# Patient Record
Sex: Female | Born: 1965
Health system: Southern US, Community
[De-identification: ages and names within clinical notes are randomized; demographics above are authoritative.]

## PROBLEM LIST (undated history)

## (undated) DIAGNOSIS — F329 Major depressive disorder, single episode, unspecified: Secondary | ICD-10-CM

## (undated) DIAGNOSIS — R519 Headache, unspecified: Secondary | ICD-10-CM

## (undated) DIAGNOSIS — Z5189 Encounter for other specified aftercare: Secondary | ICD-10-CM

## (undated) DIAGNOSIS — I499 Cardiac arrhythmia, unspecified: Secondary | ICD-10-CM

## (undated) DIAGNOSIS — F32A Depression, unspecified: Secondary | ICD-10-CM

## (undated) DIAGNOSIS — D649 Anemia, unspecified: Secondary | ICD-10-CM

## (undated) DIAGNOSIS — M1990 Unspecified osteoarthritis, unspecified site: Secondary | ICD-10-CM

## (undated) DIAGNOSIS — T7840XA Allergy, unspecified, initial encounter: Secondary | ICD-10-CM

## (undated) DIAGNOSIS — I2699 Other pulmonary embolism without acute cor pulmonale: Secondary | ICD-10-CM

## (undated) DIAGNOSIS — I10 Essential (primary) hypertension: Secondary | ICD-10-CM

## (undated) DIAGNOSIS — R002 Palpitations: Secondary | ICD-10-CM

## (undated) DIAGNOSIS — E039 Hypothyroidism, unspecified: Secondary | ICD-10-CM

## (undated) DIAGNOSIS — Z86010 Personal history of colonic polyps: Secondary | ICD-10-CM

## (undated) DIAGNOSIS — Z8489 Family history of other specified conditions: Secondary | ICD-10-CM

## (undated) DIAGNOSIS — J45909 Unspecified asthma, uncomplicated: Secondary | ICD-10-CM

## (undated) DIAGNOSIS — Z8672 Personal history of thrombophlebitis: Secondary | ICD-10-CM

## (undated) DIAGNOSIS — R51 Headache: Secondary | ICD-10-CM

## (undated) HISTORY — DX: Headache, unspecified: R51.9

## (undated) HISTORY — PX: CHOLECYSTECTOMY: SHX55

## (undated) HISTORY — DX: Encounter for other specified aftercare: Z51.89

## (undated) HISTORY — DX: Palpitations: R00.2

## (undated) HISTORY — DX: Unspecified asthma, uncomplicated: J45.909

## (undated) HISTORY — DX: Other pulmonary embolism without acute cor pulmonale: I26.99

## (undated) HISTORY — DX: Anemia, unspecified: D64.9

## (undated) HISTORY — DX: Headache: R51

## (undated) HISTORY — DX: Depression, unspecified: F32.A

## (undated) HISTORY — DX: Unspecified osteoarthritis, unspecified site: M19.90

## (undated) HISTORY — DX: Allergy, unspecified, initial encounter: T78.40XA

## (undated) HISTORY — DX: Major depressive disorder, single episode, unspecified: F32.9

## (undated) HISTORY — DX: Personal history of thrombophlebitis: Z86.72

## (undated) HISTORY — DX: Personal history of colonic polyps: Z86.010

---

## 2004-03-01 HISTORY — PX: ABDOMINAL HYSTERECTOMY: SHX81

## 2009-03-01 LAB — HM MAMMOGRAPHY: HM Mammogram: NORMAL

## 2010-03-01 LAB — HM PAP SMEAR: HM Pap smear: NORMAL

## 2011-05-03 ENCOUNTER — Ambulatory Visit
Admission: RE | Admit: 2011-05-03 | Discharge: 2011-05-03 | Disposition: A | Discharge status: Home / Self Care | Payer: No Typology Code available for payment source | Source: Ambulatory Visit | Attending: Infectious Diseases | Admitting: Infectious Diseases

## 2011-05-03 ENCOUNTER — Other Ambulatory Visit: Payer: Self-pay | Admitting: Infectious Diseases

## 2011-05-03 DIAGNOSIS — R7611 Nonspecific reaction to tuberculin skin test without active tuberculosis: Secondary | ICD-10-CM

## 2012-11-17 ENCOUNTER — Encounter: Payer: Self-pay | Admitting: Internal Medicine

## 2012-11-17 ENCOUNTER — Ambulatory Visit (INDEPENDENT_AMBULATORY_CARE_PROVIDER_SITE_OTHER): Payer: BC Managed Care – PPO | Admitting: Internal Medicine

## 2012-11-17 ENCOUNTER — Other Ambulatory Visit (INDEPENDENT_AMBULATORY_CARE_PROVIDER_SITE_OTHER): Payer: BC Managed Care – PPO

## 2012-11-17 ENCOUNTER — Ambulatory Visit (INDEPENDENT_AMBULATORY_CARE_PROVIDER_SITE_OTHER): Payer: BC Managed Care – PPO | Admitting: General Practice

## 2012-11-17 VITALS — BP 126/80 | HR 84 | Temp 98.7°F | Resp 16 | Ht 66.0 in | Wt 264.0 lb

## 2012-11-17 DIAGNOSIS — R760 Raised antibody titer: Secondary | ICD-10-CM

## 2012-11-17 DIAGNOSIS — M179 Osteoarthritis of knee, unspecified: Secondary | ICD-10-CM

## 2012-11-17 DIAGNOSIS — R894 Abnormal immunological findings in specimens from other organs, systems and tissues: Secondary | ICD-10-CM

## 2012-11-17 DIAGNOSIS — F17200 Nicotine dependence, unspecified, uncomplicated: Secondary | ICD-10-CM

## 2012-11-17 DIAGNOSIS — Z1231 Encounter for screening mammogram for malignant neoplasm of breast: Secondary | ICD-10-CM

## 2012-11-17 DIAGNOSIS — G894 Chronic pain syndrome: Secondary | ICD-10-CM

## 2012-11-17 DIAGNOSIS — Z86711 Personal history of pulmonary embolism: Secondary | ICD-10-CM

## 2012-11-17 DIAGNOSIS — Z720 Tobacco use: Secondary | ICD-10-CM

## 2012-11-17 DIAGNOSIS — IMO0002 Reserved for concepts with insufficient information to code with codable children: Secondary | ICD-10-CM

## 2012-11-17 DIAGNOSIS — M1710 Unilateral primary osteoarthritis, unspecified knee: Secondary | ICD-10-CM

## 2012-11-17 LAB — CBC WITH DIFFERENTIAL/PLATELET
Basophils Absolute: 0 10*3/uL (ref 0.0–0.1)
Eosinophils Relative: 3.1 % (ref 0.0–5.0)
MCV: 97.3 fl (ref 78.0–100.0)
Monocytes Absolute: 0.5 10*3/uL (ref 0.1–1.0)
Neutrophils Relative %: 52.6 % (ref 43.0–77.0)
Platelets: 284 10*3/uL (ref 150.0–400.0)
RDW: 14.8 % — ABNORMAL HIGH (ref 11.5–14.6)
WBC: 8.9 10*3/uL (ref 4.5–10.5)

## 2012-11-17 LAB — PROTIME-INR
INR: 1 ratio (ref 0.8–1.0)
Prothrombin Time: 10.8 s (ref 10.2–12.4)

## 2012-11-17 LAB — APTT: aPTT: 29.1 s — ABNORMAL HIGH (ref 21.7–28.8)

## 2012-11-17 MED ORDER — WARFARIN SODIUM 5 MG PO TABS: 15.0000 mg | ORAL_TABLET | Freq: Every day | ORAL | Status: DC

## 2012-11-17 MED ORDER — HYDROCODONE-ACETAMINOPHEN 5-325 MG PO TABS: 1.0000 | ORAL_TABLET | Freq: Four times a day (QID) | ORAL | Status: DC | PRN

## 2012-11-17 NOTE — Assessment & Plan Note (Signed)
Restart coumadin Check her d-dimer level

## 2012-11-17 NOTE — Assessment & Plan Note (Signed)
She has recently been taking tramadol without much relief from the pain I will her UDS to see if there is substance abuse

## 2012-11-17 NOTE — Assessment & Plan Note (Signed)
I will recheck her coag profile and anticardiolipin ab level Will also check her d-dimer to see if she has any active clotting as she has not taken coumadin in a while She was referred to the coag clinic as well

## 2012-11-17 NOTE — Patient Instructions (Signed)

## 2012-11-17 NOTE — Progress Notes (Signed)
  Subjective:    Patient ID: Denise Morgan, female    DOB: 11-Feb-1966, 47 y.o.   MRN: [id number]  Arthritis Presents for follow-up visit. The disease course has been fluctuating. The condition has lasted for 3 years. She complains of pain. She reports no stiffness, joint swelling or joint warmth. Affected locations include the left hip, right knee, right hip and left knee. Her pain is at a severity of 4/10. Associated symptoms include pain at night and pain while resting. Pertinent negatives include no diarrhea, dry eyes, dry mouth, dysuria, fatigue, fever, rash, Raynaud's syndrome, uveitis or weight loss. Her past medical history is significant for osteoarthritis. Past treatments include an opioid, acetaminophen and NSAIDs. The treatment provided moderate relief. Factors aggravating her arthritis include activity. Compliance with prior treatments has been variable.      Review of Systems  Constitutional: Negative.  Negative for fever, chills, weight loss, diaphoresis, activity change, appetite change, fatigue and unexpected weight change.  HENT: Negative.   Eyes: Negative.   Respiratory: Negative.  Negative for cough, choking, chest tightness, shortness of breath, wheezing and stridor.   Cardiovascular: Negative.  Negative for chest pain, palpitations and leg swelling.  Gastrointestinal: Negative.  Negative for nausea, vomiting, abdominal pain, diarrhea, constipation, blood in stool and anal bleeding.  Endocrine: Negative.   Genitourinary: Negative.  Negative for dysuria, frequency, hematuria, flank pain, enuresis, difficulty urinating and dyspareunia.  Musculoskeletal: Positive for arthralgias and arthritis. Negative for myalgias, back pain, joint swelling, gait problem and stiffness.  Skin: Negative.  Negative for color change, pallor, rash and wound.  Allergic/Immunologic: Negative for environmental allergies, food allergies and immunocompromised state.  Neurological: Negative.    Hematological: Negative.  Negative for adenopathy. Does not bruise/bleed easily.  Psychiatric/Behavioral: Negative.        Objective:   Physical Exam  Vitals reviewed. Constitutional: She is oriented to person, place, and time. She appears well-developed and well-nourished. No distress.  HENT:  Head: Normocephalic and atraumatic.  Mouth/Throat: Oropharynx is clear and moist. No oropharyngeal exudate.  Eyes: Conjunctivae are normal. Right eye exhibits no discharge. Left eye exhibits no discharge. No scleral icterus.  Neck: Normal range of motion. Neck supple. No JVD present. No tracheal deviation present. No thyromegaly present.  Cardiovascular: Normal rate, regular rhythm, normal heart sounds and intact distal pulses.  Exam reveals no gallop and no friction rub.   No murmur heard. Pulmonary/Chest: Effort normal and breath sounds normal. No stridor. No respiratory distress. She has no wheezes. She has no rales. She exhibits no tenderness.  Abdominal: Bowel sounds are normal. She exhibits no distension and no mass. There is no tenderness. There is no rebound and no guarding.  Musculoskeletal: Normal range of motion. She exhibits no edema and no tenderness.  Lymphadenopathy:    She has no cervical adenopathy.  Neurological: She is oriented to person, place, and time.  Skin: Skin is warm and dry. No rash noted. She is not diaphoretic. No erythema. No pallor.  Psychiatric: She has a normal mood and affect. Her behavior is normal. Judgment and thought content normal.     No results found for this basename: WBC, HGB, HCT, PLT, GLUCOSE, CHOL, TRIG, HDL, LDLDIRECT, LDLCALC, ALT, AST, NA, K, CL, CREATININE, BUN, CO2, TSH, PSA, INR, GLUF, HGBA1C, MICROALBUR       Assessment & Plan:

## 2012-11-18 LAB — DRUGS OF ABUSE SCREEN W/O ALC, ROUTINE URINE
Barbiturate Quant, Ur: NEGATIVE
Creatinine,U: 173.9 mg/dL
Methadone: NEGATIVE
Opiate Screen, Urine: NEGATIVE

## 2012-11-18 LAB — D-DIMER, QUANTITATIVE: D-Dimer, Quant: 0.33 ug/mL-FEU (ref 0.00–0.48)

## 2012-11-19 ENCOUNTER — Encounter: Payer: Self-pay | Admitting: Internal Medicine

## 2012-11-19 DIAGNOSIS — Z720 Tobacco use: Secondary | ICD-10-CM | POA: Insufficient documentation

## 2012-11-19 NOTE — Assessment & Plan Note (Signed)
I will check her UDS to see if there is any concern for substance abuse

## 2012-11-19 NOTE — Assessment & Plan Note (Signed)
Though she is not interested I have asked her to quit smoking

## 2012-11-20 ENCOUNTER — Other Ambulatory Visit: Payer: Self-pay | Admitting: Internal Medicine

## 2012-11-20 ENCOUNTER — Other Ambulatory Visit: Payer: Self-pay

## 2012-11-20 LAB — HYPERCOAGULABLE PANEL, COMPREHENSIVE
AntiThromb III Func: 109 % (ref 76–126)
Beta-2 Glyco I IgG: 0 G Units (ref <20)
Beta-2-Glycoprotein I IgA: 0 A Units (ref <20)
Beta-2-Glycoprotein I IgM: 10 M Units (ref <20)
Lupus Anticoagulant: NOT DETECTED
Protein C, Total: 87 % (ref 72–160)
Protein S Activity: 143 % — ABNORMAL HIGH (ref 69–129)

## 2012-11-20 MED ORDER — TRAZODONE HCL 100 MG PO TABS: 100.0000 mg | ORAL_TABLET | Freq: Every day | ORAL | Status: DC

## 2012-11-20 MED ORDER — ALBUTEROL SULFATE HFA 108 (90 BASE) MCG/ACT IN AERS: 2.0000 | INHALATION_SPRAY | RESPIRATORY_TRACT | Status: DC | PRN

## 2012-11-20 NOTE — Telephone Encounter (Signed)
Received faxed refill request for trazodone 100 mg, please advise if ok to refill. Thanks

## 2012-11-20 NOTE — Telephone Encounter (Signed)
Please advise if ok to refill the trazodone?

## 2012-11-21 ENCOUNTER — Encounter: Payer: Self-pay | Admitting: Internal Medicine

## 2012-11-24 ENCOUNTER — Ambulatory Visit (INDEPENDENT_AMBULATORY_CARE_PROVIDER_SITE_OTHER): Payer: BC Managed Care – PPO | Admitting: General Practice

## 2012-11-24 DIAGNOSIS — Z86711 Personal history of pulmonary embolism: Secondary | ICD-10-CM

## 2012-11-24 DIAGNOSIS — R894 Abnormal immunological findings in specimens from other organs, systems and tissues: Secondary | ICD-10-CM

## 2012-11-24 DIAGNOSIS — R760 Raised antibody titer: Secondary | ICD-10-CM

## 2012-11-24 LAB — POCT INR: INR: 3.2

## 2012-11-28 ENCOUNTER — Ambulatory Visit (INDEPENDENT_AMBULATORY_CARE_PROVIDER_SITE_OTHER): Payer: BC Managed Care – PPO | Admitting: General Practice

## 2012-11-28 ENCOUNTER — Telehealth: Payer: Self-pay | Admitting: General Practice

## 2012-11-28 DIAGNOSIS — Z86711 Personal history of pulmonary embolism: Secondary | ICD-10-CM

## 2012-11-28 DIAGNOSIS — R760 Raised antibody titer: Secondary | ICD-10-CM

## 2012-11-28 DIAGNOSIS — R894 Abnormal immunological findings in specimens from other organs, systems and tissues: Secondary | ICD-10-CM

## 2012-11-28 NOTE — Telephone Encounter (Signed)
Message copied by Garrison Columbus on Tue Nov 28, 2012  1:34 PM ------      Message from: Etta Grandchild      Created: Mon Nov 27, 2012  7:20 PM       Yes, it appears that her anticardiolipin ab has resolved.            Leitha Schuller      ----- Message -----         From: Garrison Columbus, RN         Sent: 11/24/2012  11:37 AM           To: Etta Grandchild, MD            Is patient supposed to stop coumadin?  Patient is under the impression that it is OK.            Cindy       ------

## 2012-12-08 ENCOUNTER — Ambulatory Visit
Admission: RE | Admit: 2012-12-08 | Discharge: 2012-12-08 | Disposition: A | Discharge status: Home / Self Care | Payer: BC Managed Care – PPO | Source: Ambulatory Visit | Attending: Internal Medicine | Admitting: Internal Medicine

## 2012-12-08 DIAGNOSIS — Z1231 Encounter for screening mammogram for malignant neoplasm of breast: Secondary | ICD-10-CM

## 2012-12-08 LAB — HM MAMMOGRAPHY: HM Mammogram: NORMAL

## 2013-01-11 ENCOUNTER — Telehealth: Payer: Self-pay | Admitting: *Deleted

## 2013-01-11 DIAGNOSIS — M1710 Unilateral primary osteoarthritis, unspecified knee: Secondary | ICD-10-CM

## 2013-01-11 DIAGNOSIS — M179 Osteoarthritis of knee, unspecified: Secondary | ICD-10-CM

## 2013-01-11 DIAGNOSIS — G894 Chronic pain syndrome: Secondary | ICD-10-CM

## 2013-01-11 MED ORDER — HYDROCODONE-ACETAMINOPHEN 5-325 MG PO TABS: 1.0000 | ORAL_TABLET | Freq: Four times a day (QID) | ORAL | Status: DC | PRN

## 2013-01-11 NOTE — Telephone Encounter (Signed)
Pt called requesting Hydrocodone refill.  Please advise 

## 2013-02-16 ENCOUNTER — Ambulatory Visit (INDEPENDENT_AMBULATORY_CARE_PROVIDER_SITE_OTHER): Payer: BC Managed Care – PPO

## 2013-02-16 ENCOUNTER — Ambulatory Visit (INDEPENDENT_AMBULATORY_CARE_PROVIDER_SITE_OTHER)
Admission: RE | Admit: 2013-02-16 | Discharge: 2013-02-16 | Disposition: A | Discharge status: Home / Self Care | Payer: BC Managed Care – PPO | Source: Ambulatory Visit | Attending: Internal Medicine | Admitting: Internal Medicine

## 2013-02-16 ENCOUNTER — Ambulatory Visit (INDEPENDENT_AMBULATORY_CARE_PROVIDER_SITE_OTHER): Payer: BC Managed Care – PPO | Admitting: Internal Medicine

## 2013-02-16 ENCOUNTER — Encounter: Payer: Self-pay | Admitting: Internal Medicine

## 2013-02-16 DIAGNOSIS — M25559 Pain in unspecified hip: Secondary | ICD-10-CM

## 2013-02-16 DIAGNOSIS — M169 Osteoarthritis of hip, unspecified: Secondary | ICD-10-CM | POA: Insufficient documentation

## 2013-02-16 DIAGNOSIS — M179 Osteoarthritis of knee, unspecified: Secondary | ICD-10-CM

## 2013-02-16 DIAGNOSIS — M549 Dorsalgia, unspecified: Secondary | ICD-10-CM

## 2013-02-16 DIAGNOSIS — M1710 Unilateral primary osteoarthritis, unspecified knee: Secondary | ICD-10-CM

## 2013-02-16 DIAGNOSIS — G894 Chronic pain syndrome: Secondary | ICD-10-CM

## 2013-02-16 DIAGNOSIS — Z0000 Encounter for general adult medical examination without abnormal findings: Secondary | ICD-10-CM | POA: Insufficient documentation

## 2013-02-16 DIAGNOSIS — G8929 Other chronic pain: Secondary | ICD-10-CM

## 2013-02-16 DIAGNOSIS — IMO0002 Reserved for concepts with insufficient information to code with codable children: Secondary | ICD-10-CM

## 2013-02-16 DIAGNOSIS — Z23 Encounter for immunization: Secondary | ICD-10-CM

## 2013-02-16 DIAGNOSIS — M1611 Unilateral primary osteoarthritis, right hip: Secondary | ICD-10-CM | POA: Insufficient documentation

## 2013-02-16 LAB — COMPREHENSIVE METABOLIC PANEL
AST: 21 U/L (ref 0–37)
Alkaline Phosphatase: 80 U/L (ref 39–117)
BUN: 10 mg/dL (ref 6–23)
Calcium: 8.9 mg/dL (ref 8.4–10.5)
Creatinine, Ser: 0.7 mg/dL (ref 0.4–1.2)
Glucose, Bld: 90 mg/dL (ref 70–99)
Total Protein: 7.2 g/dL (ref 6.0–8.3)

## 2013-02-16 LAB — LIPID PANEL
Cholesterol: 211 mg/dL — ABNORMAL HIGH (ref 0–200)
Total CHOL/HDL Ratio: 5
Triglycerides: 117 mg/dL (ref 0.0–149.0)
VLDL: 23.4 mg/dL (ref 0.0–40.0)

## 2013-02-16 LAB — LDL CHOLESTEROL, DIRECT: Direct LDL: 155.6 mg/dL

## 2013-02-16 LAB — TSH: TSH: 1.21 u[IU]/mL (ref 0.35–5.50)

## 2013-02-16 MED ORDER — HYDROCODONE-ACETAMINOPHEN 5-325 MG PO TABS: 1.0000 | ORAL_TABLET | Freq: Four times a day (QID) | ORAL | Status: DC | PRN

## 2013-02-16 NOTE — Patient Instructions (Signed)
Health Maintenance, Female A healthy lifestyle and preventative care can promote health and wellness.  Maintain regular health, dental, and eye exams.  Eat a healthy diet. Foods like vegetables, fruits, whole grains, low-fat dairy products, and lean protein foods contain the nutrients you need without too many calories. Decrease your intake of foods high in solid fats, added sugars, and salt. Get information about a proper diet from your caregiver, if necessary.  Regular physical exercise is one of the most important things you can do for your health. Most adults should get at least 150 minutes of moderate-intensity exercise (any activity that increases your heart rate and causes you to sweat) each week. In addition, most adults need muscle-strengthening exercises on 2 or more days a week.   Maintain a healthy weight. The body mass index (BMI) is a screening tool to identify possible weight problems. It provides an estimate of body fat based on height and weight. Your caregiver can help determine your BMI, and can help you achieve or maintain a healthy weight. For adults 20 years and older:  A BMI below 18.5 is considered underweight.  A BMI of 18.5 to 24.9 is normal.  A BMI of 25 to 29.9 is considered overweight.  A BMI of 30 and above is considered obese.  Maintain normal blood lipids and cholesterol by exercising and minimizing your intake of saturated fat. Eat a balanced diet with plenty of fruits and vegetables. Blood tests for lipids and cholesterol should begin at age 20 and be repeated every 5 years. If your lipid or cholesterol levels are high, you are over 50, or you are a high risk for heart disease, you may need your cholesterol levels checked more frequently.Ongoing high lipid and cholesterol levels should be treated with medicines if diet and exercise are not effective.  If you smoke, find out from your caregiver how to quit. If you do not use tobacco, do not start.  Lung  cancer screening is recommended for adults aged 55 80 years who are at high risk for developing lung cancer because of a history of smoking. Yearly low-dose computed tomography (CT) is recommended for people who have at least a 30-pack-year history of smoking and are a current smoker or have quit within the past 15 years. A pack year of smoking is smoking an average of 1 pack of cigarettes a day for 1 year (for example: 1 pack a day for 30 years or 2 packs a day for 15 years). Yearly screening should continue until the smoker has stopped smoking for at least 15 years. Yearly screening should also be stopped for people who develop a health problem that would prevent them from having lung cancer treatment.  If you are pregnant, do not drink alcohol. If you are breastfeeding, be very cautious about drinking alcohol. If you are not pregnant and choose to drink alcohol, do not exceed 1 drink per day. One drink is considered to be 12 ounces (355 mL) of beer, 5 ounces (148 mL) of wine, or 1.5 ounces (44 mL) of liquor.  Avoid use of street drugs. Do not share needles with anyone. Ask for help if you need support or instructions about stopping the use of drugs.  High blood pressure causes heart disease and increases the risk of stroke. Blood pressure should be checked at least every 1 to 2 years. Ongoing high blood pressure should be treated with medicines, if weight loss and exercise are not effective.  If you are 55 to   47 years old, ask your caregiver if you should take aspirin to prevent strokes.  Diabetes screening involves taking a blood sample to check your fasting blood sugar level. This should be done once every 3 years, after age 45, if you are within normal weight and without risk factors for diabetes. Testing should be considered at a younger age or be carried out more frequently if you are overweight and have at least 1 risk factor for diabetes.  Breast cancer screening is essential preventative care  for women. You should practice "breast self-awareness." This means understanding the normal appearance and feel of your breasts and may include breast self-examination. Any changes detected, no matter how small, should be reported to a caregiver. Women in their 20s and 30s should have a clinical breast exam (CBE) by a caregiver as part of a regular health exam every 1 to 3 years. After age 40, women should have a CBE every year. Starting at age 40, women should consider having a mammogram (breast X-ray) every year. Women who have a family history of breast cancer should talk to their caregiver about genetic screening. Women at a high risk of breast cancer should talk to their caregiver about having an MRI and a mammogram every year.  Breast cancer gene (BRCA)-related cancer risk assessment is recommended for women who have family members with BRCA-related cancers. BRCA-related cancers include breast, ovarian, tubal, and peritoneal cancers. Having family members with these cancers may be associated with an increased risk for harmful changes (mutations) in the breast cancer genes BRCA1 and BRCA2. Results of the assessment will determine the need for genetic counseling and BRCA1 and BRCA2 testing.  The Pap test is a screening test for cervical cancer. Women should have a Pap test starting at age 21. Between ages 21 and 29, Pap tests should be repeated every 2 years. Beginning at age 30, you should have a Pap test every 3 years as long as the past 3 Pap tests have been normal. If you had a hysterectomy for a problem that was not cancer or a condition that could lead to cancer, then you no longer need Pap tests. If you are between ages 65 and 70, and you have had normal Pap tests going back 10 years, you no longer need Pap tests. If you have had past treatment for cervical cancer or a condition that could lead to cancer, you need Pap tests and screening for cancer for at least 20 years after your treatment. If Pap  tests have been discontinued, risk factors (such as a new sexual partner) need to be reassessed to determine if screening should be resumed. Some women have medical problems that increase the chance of getting cervical cancer. In these cases, your caregiver may recommend more frequent screening and Pap tests.  The human papillomavirus (HPV) test is an additional test that may be used for cervical cancer screening. The HPV test looks for the virus that can cause the cell changes on the cervix. The cells collected during the Pap test can be tested for HPV. The HPV test could be used to screen women aged 30 years and older, and should be used in women of any age who have unclear Pap test results. After the age of 30, women should have HPV testing at the same frequency as a Pap test.  Colorectal cancer can be detected and often prevented. Most routine colorectal cancer screening begins at the age of 50 and continues through age 75. However, your caregiver   may recommend screening at an earlier age if you have risk factors for colon cancer. On a yearly basis, your caregiver may provide home test kits to check for hidden blood in the stool. Use of a small camera at the end of a tube, to directly examine the colon (sigmoidoscopy or colonoscopy), can detect the earliest forms of colorectal cancer. Talk to your caregiver about this at age 93, when routine screening begins. Direct examination of the colon should be repeated every 5 to 10 years through age 86, unless early forms of pre-cancerous polyps or small growths are found.  Hepatitis C blood testing is recommended for all people born from [birth year] through 1965 and any individual with known risks for hepatitis C.  Practice safe sex. Use condoms and avoid high-risk sexual practices to reduce the spread of sexually transmitted infections (STIs). Sexually active women aged 45 and younger should be checked for Chlamydia, which is a common sexually transmitted infection.  Older women with new or multiple partners should also be tested for Chlamydia. Testing for other STIs is recommended if you are sexually active and at increased risk.  Osteoporosis is a disease in which the bones lose minerals and strength with aging. This can result in serious bone fractures. The risk of osteoporosis can be identified using a bone density scan. Women ages 58 and over and women at risk for fractures or osteoporosis should discuss screening with their caregivers. Ask your caregiver whether you should be taking a calcium supplement or vitamin D to reduce the rate of osteoporosis.  Menopause can be associated with physical symptoms and risks. Hormone replacement therapy is available to decrease symptoms and risks. You should talk to your caregiver about whether hormone replacement therapy is right for you.  Use sunscreen. Apply sunscreen liberally and repeatedly throughout the day. You should seek shade when your shadow is shorter than you. Protect yourself by wearing long sleeves, pants, a wide-brimmed hat, and sunglasses year round, whenever you are outdoors.  Notify your caregiver of new moles or changes in moles, especially if there is a change in shape or color. Also notify your caregiver if a mole is larger than the size of a pencil eraser.  Stay current with your immunizations. Document Released: 08/31/2010 Document Revised: 06/12/2012 Document Reviewed: 08/31/2010 [facility] Patient Information 2014 [place], [place]. Osteoarthritis Osteoarthritis is the most common form of arthritis. It is redness, soreness, and swelling (inflammation) affecting the cartilage. Cartilage acts as a cushion, covering the ends of bones where they meet to form a joint. CAUSES  Over time, the cartilage begins to wear away. This causes bone to rub on bone. This produces pain and stiffness in the affected joints. Factors that contribute to this problem are:  Excessive body weight.  Age.  Overuse of  joints. SYMPTOMS   People with osteoarthritis usually experience joint pain, swelling, or stiffness.  Over time, the joint may lose its normal shape.  Small deposits of bone (osteophytes) may grow on the edges of the joint.  Bits of bone or cartilage can break off and float inside the joint space. This may cause more pain and damage.  Osteoarthritis can lead to depression, anxiety, feelings of helplessness, and limitations on daily activities. The most commonly affected joints are in the:  Ends of the fingers.  Thumbs.  Neck.  Lower back.  Knees.  Hips. DIAGNOSIS  Diagnosis is mostly based on your symptoms and exam. Tests may be helpful, including:  X-rays of the affected joint.  A computerized magnetic scan (MRI).  Blood tests to rule out other types of arthritis.  Joint fluid tests. This involves using a needle to draw fluid from the joint and examining the fluid under a microscope. TREATMENT  Goals of treatment are to control pain, improve joint function, maintain a normal body weight, and maintain a healthy lifestyle. Treatment approaches may include:  A prescribed exercise program with rest and joint relief.  Weight control with nutritional education.  Pain relief techniques such as:  Properly applied heat and cold.  Electric pulses delivered to nerve endings under the skin (transcutaneous electrical nerve stimulation, TENS).  Massage.  Certain supplements. Ask your caregiver before using any supplements, especially in combination with prescribed drugs.  Medicines to control pain, such as:  Acetaminophen.  Nonsteroidal anti-inflammatory drugs (NSAIDs), such as naproxen.  Narcotic or central-acting agents, such as tramadol. This drug carries a risk of addiction and is generally prescribed for short-term use.  Corticosteroids. These can be given orally or as injection. This is a short-term treatment, not recommended for routine use.  Surgery to  reposition the bones and relieve pain (osteotomy) or to remove loose pieces of bone and cartilage. Joint replacement may be needed in advanced states of osteoarthritis. HOME CARE INSTRUCTIONS  Your caregiver can recommend specific types of exercise. These may include:  Strengthening exercises. These are done to strengthen the muscles that support joints affected by arthritis. They can be performed with weights or with exercise bands to add resistance.  Aerobic activities. These are exercises, such as brisk walking or low-impact aerobics, that get your heart pumping. They can help keep your lungs and circulatory system in shape.  Range-of-motion activities. These keep your joints limber.  Balance and agility exercises. These help you maintain daily living skills. Learning about your condition and being actively involved in your care will help improve the course of your osteoarthritis. SEEK MEDICAL CARE IF:   You feel hot or your skin turns red.  You develop a rash in addition to your joint pain.  You have an oral temperature above 102 F (38.9 C). FOR MORE INFORMATION  National Institute of Arthritis and Musculoskeletal and Skin Diseases: www.niams.http://www.myers.net/ [employer] on Aging: https://walker.com/ American College of Rheumatology: www.rheumatology.org Document Released: 02/15/2005 Document Revised: 05/10/2011 Document Reviewed: 05/29/2009 [facility] Patient Information 2014 [place], [place].

## 2013-02-16 NOTE — Progress Notes (Signed)
Pre visit review using our clinic review tool, if applicable. No additional management support is needed unless otherwise documented below in the visit note. 

## 2013-02-16 NOTE — Progress Notes (Signed)
Subjective:    Patient ID: Denise Morgan, female    DOB: 07/15/1965, 47 y.o.   MRN: [id number]  Arthritis Presents for follow-up visit. The disease course has been fluctuating. The condition has lasted for 2 years. She complains of pain. She reports no stiffness, joint swelling or joint warmth. Affected locations include the left hip and right hip. Her pain is at a severity of 5/10. Associated symptoms include pain at night and pain while resting. Pertinent negatives include no diarrhea, dry eyes, dry mouth, dysuria, fatigue, fever, rash, Raynaud's syndrome, uveitis or weight loss. Her past medical history is significant for chronic back pain and osteoarthritis. There is no history of lupus, psoriasis or rheumatoid arthritis.  Her pertinent risk factors include overuse. Past treatments include acetaminophen and an opioid. The treatment provided significant relief. Factors aggravating her arthritis include activity. Compliance with prior treatments has been good. Compliance with total regimen is 76-100%.      Review of Systems  Constitutional: Negative.  Negative for fever, chills, weight loss, diaphoresis, activity change, appetite change, fatigue and unexpected weight change.  HENT: Negative.   Eyes: Negative.   Respiratory: Negative.  Negative for apnea, cough, choking, chest tightness, shortness of breath and wheezing.   Cardiovascular: Negative.  Negative for chest pain, palpitations and leg swelling.  Gastrointestinal: Negative.  Negative for vomiting, abdominal pain, diarrhea, constipation, blood in stool, abdominal distention, anal bleeding and rectal pain.  Endocrine: Negative.   Genitourinary: Negative.  Negative for dysuria.  Musculoskeletal: Positive for arthralgias, arthritis and back pain. Negative for gait problem, joint swelling, myalgias, neck pain, neck stiffness and stiffness.  Skin: Negative.  Negative for rash.  Allergic/Immunologic: Negative.   Neurological: Negative.    Hematological: Negative.  Negative for adenopathy. Does not bruise/bleed easily.  Psychiatric/Behavioral: Negative.        Objective:   Physical Exam  Vitals reviewed. Constitutional: She is oriented to person, place, and time. She appears well-developed and well-nourished. No distress.  HENT:  Head: Normocephalic and atraumatic.  Mouth/Throat: Oropharynx is clear and moist. No oropharyngeal exudate.  Eyes: Conjunctivae are normal. Right eye exhibits no discharge. Left eye exhibits no discharge. No scleral icterus.  Neck: Normal range of motion. Neck supple. No JVD present. No tracheal deviation present. No thyromegaly present.  Cardiovascular: Normal rate, regular rhythm, normal heart sounds and intact distal pulses.  Exam reveals no gallop and no friction rub.   No murmur heard. Pulmonary/Chest: Effort normal and breath sounds normal. No stridor. No respiratory distress. She has no wheezes. She has no rales. She exhibits no tenderness.  Abdominal: Soft. Bowel sounds are normal. She exhibits no distension and no mass. There is no tenderness. There is no rebound and no guarding.  Musculoskeletal: Normal range of motion. She exhibits no edema and no tenderness.       Right hip: Normal. She exhibits normal range of motion, normal strength, no tenderness, no bony tenderness, no swelling, no crepitus, no deformity and no laceration.       Left hip: Normal. She exhibits normal range of motion, normal strength, no tenderness, no bony tenderness, no swelling, no crepitus, no deformity and no laceration.       Lumbar back: Normal. She exhibits normal range of motion, no tenderness, no bony tenderness, no swelling, no edema, no deformity, no laceration, no pain, no spasm and normal pulse.  Lymphadenopathy:    She has no cervical adenopathy.  Neurological: She is alert and oriented to person, place, and time.  She has normal strength. She displays no atrophy, no tremor and normal reflexes. No cranial  nerve deficit or sensory deficit. She exhibits normal muscle tone. She displays a negative Romberg sign. She displays no seizure activity. Coordination and gait normal.  Reflex Scores:      Tricep reflexes are 1+ on the right side and 1+ on the left side.      Bicep reflexes are 1+ on the right side and 1+ on the left side.      Brachioradialis reflexes are 1+ on the right side and 1+ on the left side.      Patellar reflexes are 1+ on the right side and 1+ on the left side.      Achilles reflexes are 1+ on the right side and 1+ on the left side. Neg SLR in BLE  Skin: Skin is warm and dry. No rash noted. She is not diaphoretic. No erythema. No pallor.  Psychiatric: She has a normal mood and affect. Her behavior is normal. Judgment and thought content normal.     Lab Results  Component Value Date   WBC 8.9 11/17/2012   HGB 13.1 11/17/2012   HCT 38.6 11/17/2012   PLT 284.0 11/17/2012   INR 3.2 11/24/2012       Assessment & Plan:

## 2013-02-17 LAB — DRUGS OF ABUSE SCREEN W/O ALC, ROUTINE URINE
Barbiturate Quant, Ur: NEGATIVE
Benzodiazepines.: NEGATIVE
Cocaine Metabolites: NEGATIVE
Creatinine,U: 223.1 mg/dL
Marijuana Metabolite: NEGATIVE
Opiate Screen, Urine: POSITIVE — AB
Phencyclidine (PCP): NEGATIVE
Propoxyphene: NEGATIVE

## 2013-02-18 ENCOUNTER — Encounter: Payer: Self-pay | Admitting: Internal Medicine

## 2013-02-19 ENCOUNTER — Encounter: Payer: Self-pay | Admitting: Internal Medicine

## 2013-02-19 NOTE — Assessment & Plan Note (Signed)
Exam done Vaccines were updated Labs ordered Pt ed material was given 

## 2013-02-19 NOTE — Assessment & Plan Note (Signed)
Her UDS is consistent

## 2013-02-19 NOTE — Assessment & Plan Note (Signed)
She will work on her lifestyle modifications to lose weight 

## 2013-02-19 NOTE — Assessment & Plan Note (Signed)
Plain film shows mild DDD She will cont norco as needed for pain

## 2013-02-19 NOTE — Assessment & Plan Note (Signed)
The plain films show mild DJD She does not want to consider surgery For now, she will cont norco as needed for pain

## 2013-02-20 ENCOUNTER — Telehealth: Payer: Self-pay | Admitting: *Deleted

## 2013-02-20 LAB — OPIATES/OPIOIDS (LC/MS-MS)
Codeine Urine: NEGATIVE ng/mL
Heroin (6-AM), UR: NEGATIVE ng/mL
Hydrocodone: 1303 ng/mL
Norhydrocodone, Ur: 1767 ng/mL
Oxycodone, ur: NEGATIVE ng/mL
Oxymorphone: NEGATIVE ng/mL

## 2013-02-20 NOTE — Telephone Encounter (Signed)
Pt called requesting Xray results from 12.19.14.  Please advise

## 2013-02-20 NOTE — Telephone Encounter (Signed)
Spoke with pt advised of MDs message 

## 2013-02-20 NOTE — Telephone Encounter (Signed)
Some areas of mild arthritis in her hips and low back - I sent her a letter

## 2013-03-20 ENCOUNTER — Telehealth: Payer: Self-pay | Admitting: Internal Medicine

## 2013-03-20 DIAGNOSIS — G8929 Other chronic pain: Secondary | ICD-10-CM

## 2013-03-20 DIAGNOSIS — M179 Osteoarthritis of knee, unspecified: Secondary | ICD-10-CM

## 2013-03-20 DIAGNOSIS — G894 Chronic pain syndrome: Secondary | ICD-10-CM

## 2013-03-20 DIAGNOSIS — M1710 Unilateral primary osteoarthritis, unspecified knee: Secondary | ICD-10-CM

## 2013-03-20 DIAGNOSIS — M549 Dorsalgia, unspecified: Secondary | ICD-10-CM

## 2013-03-20 MED ORDER — HYDROCODONE-ACETAMINOPHEN 5-325 MG PO TABS: 1.0000 | ORAL_TABLET | Freq: Four times a day (QID) | ORAL | Status: DC | PRN

## 2013-03-20 NOTE — Telephone Encounter (Signed)
done

## 2013-03-20 NOTE — Telephone Encounter (Signed)
Pt request refill for hydrocodone. Please advise.  °

## 2013-03-21 NOTE — Telephone Encounter (Signed)
Pt notified//lmovm 

## 2013-04-20 ENCOUNTER — Telehealth: Payer: Self-pay

## 2013-04-20 DIAGNOSIS — M179 Osteoarthritis of knee, unspecified: Secondary | ICD-10-CM

## 2013-04-20 DIAGNOSIS — M549 Dorsalgia, unspecified: Secondary | ICD-10-CM

## 2013-04-20 DIAGNOSIS — G894 Chronic pain syndrome: Secondary | ICD-10-CM

## 2013-04-20 DIAGNOSIS — G8929 Other chronic pain: Secondary | ICD-10-CM

## 2013-04-20 DIAGNOSIS — M1710 Unilateral primary osteoarthritis, unspecified knee: Secondary | ICD-10-CM

## 2013-04-20 MED ORDER — HYDROCODONE-ACETAMINOPHEN 5-325 MG PO TABS: 1.0000 | ORAL_TABLET | Freq: Four times a day (QID) | ORAL | Status: DC | PRN

## 2013-04-20 NOTE — Telephone Encounter (Signed)
Done hardcopy to robin  

## 2013-04-20 NOTE — Telephone Encounter (Signed)
Gave hardcopy to [employer] assistant

## 2013-04-20 NOTE — Telephone Encounter (Signed)
Rx approved//lmovm for pt to pick up

## 2013-04-20 NOTE — Telephone Encounter (Signed)
Pt called [facility] requesting rx refill for hydrocodone. Medication last filled 03/20/13 and pt last seen 02/16/13. Please advise Thanks

## 2013-05-14 ENCOUNTER — Encounter: Payer: Self-pay | Admitting: Internal Medicine

## 2013-05-16 ENCOUNTER — Telehealth: Payer: Self-pay | Admitting: *Deleted

## 2013-05-16 DIAGNOSIS — M549 Dorsalgia, unspecified: Secondary | ICD-10-CM

## 2013-05-16 DIAGNOSIS — M179 Osteoarthritis of knee, unspecified: Secondary | ICD-10-CM

## 2013-05-16 DIAGNOSIS — G894 Chronic pain syndrome: Secondary | ICD-10-CM

## 2013-05-16 DIAGNOSIS — G8929 Other chronic pain: Secondary | ICD-10-CM

## 2013-05-16 DIAGNOSIS — M1710 Unilateral primary osteoarthritis, unspecified knee: Secondary | ICD-10-CM

## 2013-05-16 NOTE — Telephone Encounter (Signed)
Pt called requesting Hydrocodone refill. Last refill 2.20.15. Please advise

## 2013-05-17 MED ORDER — HYDROCODONE-ACETAMINOPHEN 5-325 MG PO TABS: 1.0000 | ORAL_TABLET | Freq: Four times a day (QID) | ORAL | Status: DC | PRN

## 2013-05-17 NOTE — Telephone Encounter (Signed)
Done, please tell her to schedule a visit before requesting any more refills

## 2013-05-17 NOTE — Telephone Encounter (Signed)
Pt notified Rx ready for pickup 

## 2013-06-18 ENCOUNTER — Telehealth: Payer: Self-pay

## 2013-06-18 NOTE — Telephone Encounter (Signed)
Pt last seen 12/14, over due for follow up and will need appointment to get refill. Thanks

## 2013-06-18 NOTE — Telephone Encounter (Signed)
The pt called hoping to get a refill of her hydrocodone pain medicine.  Callback - [phone] (cell) may leave detailed msg)

## 2013-06-20 ENCOUNTER — Telehealth: Payer: Self-pay | Admitting: *Deleted

## 2013-06-20 DIAGNOSIS — G8929 Other chronic pain: Secondary | ICD-10-CM

## 2013-06-20 DIAGNOSIS — M179 Osteoarthritis of knee, unspecified: Secondary | ICD-10-CM

## 2013-06-20 DIAGNOSIS — G894 Chronic pain syndrome: Secondary | ICD-10-CM

## 2013-06-20 DIAGNOSIS — M1710 Unilateral primary osteoarthritis, unspecified knee: Secondary | ICD-10-CM

## 2013-06-20 DIAGNOSIS — M549 Dorsalgia, unspecified: Secondary | ICD-10-CM

## 2013-06-20 MED ORDER — HYDROCODONE-ACETAMINOPHEN 5-325 MG PO TABS: 1.0000 | ORAL_TABLET | Freq: Four times a day (QID) | ORAL | Status: DC | PRN

## 2013-06-20 NOTE — Telephone Encounter (Signed)
Done hardcopy to robin  

## 2013-06-20 NOTE — Telephone Encounter (Signed)
Pt called requesting Hydrocodone refill.  Please advise 

## 2013-06-20 NOTE — Telephone Encounter (Signed)
Called pt to pick up her rx for hydrocodone. Pt stated she will tomorrow.

## 2013-07-17 ENCOUNTER — Telehealth: Payer: Self-pay | Admitting: *Deleted

## 2013-07-17 NOTE — Telephone Encounter (Signed)
She is due for an OV 

## 2013-07-17 NOTE — Telephone Encounter (Signed)
Spoke with pt advised of MDs message 

## 2013-07-17 NOTE — Telephone Encounter (Signed)
Pt called requesting Hydrocodone refill.  Last refill 4.22.15.  Please advise

## 2013-07-20 ENCOUNTER — Encounter: Payer: Self-pay | Admitting: Internal Medicine

## 2013-07-20 ENCOUNTER — Ambulatory Visit (INDEPENDENT_AMBULATORY_CARE_PROVIDER_SITE_OTHER): Payer: BC Managed Care – PPO | Admitting: Internal Medicine

## 2013-07-20 VITALS — BP 122/76 | HR 86 | Temp 98.6°F | Resp 16 | Ht 66.0 in | Wt 272.0 lb

## 2013-07-20 DIAGNOSIS — M1710 Unilateral primary osteoarthritis, unspecified knee: Secondary | ICD-10-CM

## 2013-07-20 DIAGNOSIS — G8929 Other chronic pain: Secondary | ICD-10-CM

## 2013-07-20 DIAGNOSIS — M179 Osteoarthritis of knee, unspecified: Secondary | ICD-10-CM

## 2013-07-20 DIAGNOSIS — G894 Chronic pain syndrome: Secondary | ICD-10-CM

## 2013-07-20 DIAGNOSIS — M169 Osteoarthritis of hip, unspecified: Secondary | ICD-10-CM

## 2013-07-20 DIAGNOSIS — M549 Dorsalgia, unspecified: Secondary | ICD-10-CM

## 2013-07-20 DIAGNOSIS — IMO0002 Reserved for concepts with insufficient information to code with codable children: Secondary | ICD-10-CM

## 2013-07-20 DIAGNOSIS — M1610 Unilateral primary osteoarthritis, unspecified hip: Secondary | ICD-10-CM

## 2013-07-20 MED ORDER — HYDROCODONE-ACETAMINOPHEN 5-325 MG PO TABS: 1.0000 | ORAL_TABLET | Freq: Four times a day (QID) | ORAL | Status: DC | PRN

## 2013-07-20 MED ORDER — IBUPROFEN-FAMOTIDINE 800-26.6 MG PO TABS: 1.0000 | ORAL_TABLET | Freq: Three times a day (TID) | ORAL | Status: DC | PRN

## 2013-07-20 NOTE — Progress Notes (Signed)
Pre visit review using our clinic review tool, if applicable. No additional management support is needed unless otherwise documented below in the visit note. 

## 2013-07-20 NOTE — Progress Notes (Signed)
Subjective:    Patient ID: Denise Morgan, female    DOB: Apr 23, 1965, 48 y.o.   MRN: [id number]  Arthritis Presents for follow-up visit. The disease course has been fluctuating. She complains of pain. She reports no stiffness, joint swelling or joint warmth. Affected locations include the right hip, left hip, right knee and left knee. Her pain is at a severity of 4/10. Associated symptoms include pain at night and pain while resting. Pertinent negatives include no diarrhea, dry eyes, dry mouth, dysuria, fatigue, fever, rash, Raynaud's syndrome, uveitis or weight loss. Her past medical history is significant for chronic back pain and osteoarthritis. There is no history of lupus, psoriasis or rheumatoid arthritis.  Past treatments include an opioid and acetaminophen. The treatment provided moderate relief. Factors aggravating her arthritis include activity. Compliance with total regimen is 76-100%.      Review of Systems  Constitutional: Negative.  Negative for fever, chills, weight loss, diaphoresis, appetite change and fatigue.  HENT: Negative.   Eyes: Negative.   Respiratory: Negative.  Negative for cough, choking, chest tightness, shortness of breath and stridor.   Cardiovascular: Negative.  Negative for chest pain, palpitations and leg swelling.  Gastrointestinal: Negative.  Negative for nausea, vomiting, abdominal pain, diarrhea, constipation and blood in stool.  Endocrine: Negative.   Genitourinary: Negative.  Negative for dysuria.  Musculoskeletal: Positive for arthralgias, arthritis and back pain (chronic, unchanged low back pain). Negative for gait problem, joint swelling, myalgias, neck pain, neck stiffness and stiffness.  Skin: Negative.  Negative for rash.  Allergic/Immunologic: Negative.   Neurological: Negative.   Hematological: Negative.  Negative for adenopathy. Does not bruise/bleed easily.  Psychiatric/Behavioral: Negative.        Objective:   Physical Exam  Vitals  reviewed. Constitutional: She is oriented to person, place, and time. She appears well-developed and well-nourished. No distress.  HENT:  Head: Normocephalic and atraumatic.  Mouth/Throat: Oropharynx is clear and moist. No oropharyngeal exudate.  Eyes: Conjunctivae are normal. Right eye exhibits no discharge. Left eye exhibits no discharge. No scleral icterus.  Neck: Normal range of motion. Neck supple. No JVD present. No tracheal deviation present. No thyromegaly present.  Cardiovascular: Normal rate, regular rhythm, normal heart sounds and intact distal pulses.  Exam reveals no gallop and no friction rub.   No murmur heard. Pulmonary/Chest: Effort normal and breath sounds normal. No stridor. No respiratory distress. She has no wheezes. She has no rales. She exhibits no tenderness.  Abdominal: Soft. Bowel sounds are normal. She exhibits no distension and no mass. There is no tenderness. There is no rebound and no guarding.  Musculoskeletal: Normal range of motion. She exhibits no edema and no tenderness.       Lumbar back: Normal. She exhibits normal range of motion, no tenderness, no bony tenderness, no swelling, no edema, no deformity, no laceration, no pain, no spasm and normal pulse.  Lymphadenopathy:    She has no cervical adenopathy.  Neurological: She is alert and oriented to person, place, and time. She has normal strength. She displays no atrophy and no tremor. She exhibits normal muscle tone. She displays a negative Romberg sign. She displays no seizure activity. Coordination and gait normal.  Neg slr in ble  Skin: Skin is warm and dry. No rash noted. She is not diaphoretic. No erythema. No pallor.  Psychiatric: She has a normal mood and affect. Her behavior is normal. Judgment and thought content normal.     Lab Results  Component Value Date  WBC 8.9 11/17/2012   HGB 13.1 11/17/2012   HCT 38.6 11/17/2012   PLT 284.0 11/17/2012   GLUCOSE 90 02/16/2013   CHOL 211* 02/16/2013    TRIG 117.0 02/16/2013   HDL 44.80 02/16/2013   LDLDIRECT 155.6 02/16/2013   ALT 24 02/16/2013   AST 21 02/16/2013   NA 139 02/16/2013   K 3.5 02/16/2013   CL 107 02/16/2013   CREATININE 0.7 02/16/2013   BUN 10 02/16/2013   CO2 27 02/16/2013   TSH 1.21 02/16/2013   INR 3.2 11/24/2012       Assessment & Plan:

## 2013-07-20 NOTE — Patient Instructions (Signed)
Osteoarthritis Osteoarthritis is a disease that causes soreness and swelling (inflammation) of a joint. It occurs when the cartilage at the affected joint wears down. Cartilage acts as a cushion, covering the ends of bones where they meet to form a joint. Osteoarthritis is the most common form of arthritis. It often occurs in older people. The joints affected most often by this condition include those in the:  Ends of the fingers.  Thumbs.  Neck.  Lower back.  Knees.  Hips. CAUSES  Over time, the cartilage that covers the ends of bones begins to wear away. This causes bone to rub on bone, producing pain and stiffness in the affected joints.  RISK FACTORS Certain factors can increase your chances of having osteoarthritis, including:  Older age.  Excessive body weight.  Overuse of joints. SIGNS AND SYMPTOMS   Pain, swelling, and stiffness in the joint.  Over time, the joint may lose its normal shape.  Small deposits of bone (osteophytes) may grow on the edges of the joint.  Bits of bone or cartilage can break off and float inside the joint space. This may cause more pain and damage. DIAGNOSIS  Your health care provider will do a physical exam and ask about your symptoms. Various tests may be ordered, such as:  X-rays of the affected joint.  An MRI scan.  Blood tests to rule out other types of arthritis.  Joint fluid tests. This involves using a needle to draw fluid from the joint and examining the fluid under a microscope. TREATMENT  Goals of treatment are to control pain and improve joint function. Treatment plans may include:  A prescribed exercise program that allows for rest and joint relief.  A weight control plan.  Pain relief techniques, such as:  Properly applied heat and cold.  Electric pulses delivered to nerve endings under the skin (transcutaneous electrical nerve stimulation, TENS).  Massage.  Certain nutritional supplements.  Medicines to  control pain, such as:  Acetaminophen.  Nonsteroidal anti-inflammatory drugs (NSAIDs), such as naproxen.  Narcotic or central-acting agents, such as tramadol.  Corticosteroids. These can be given orally or as an injection.  Surgery to reposition the bones and relieve pain (osteotomy) or to remove loose pieces of bone and cartilage. Joint replacement may be needed in advanced states of osteoarthritis. HOME CARE INSTRUCTIONS   Only take over-the-counter or prescription medicines as directed by your health care provider. Take all medicines exactly as instructed.  Maintain a healthy weight. Follow your health care provider's instructions for weight control. This may include dietary instructions.  Exercise as directed. Your health care provider can recommend specific types of exercise. These may include:  Strengthening exercises These are done to strengthen the muscles that support joints affected by arthritis. They can be performed with weights or with exercise bands to add resistance.  Aerobic activities These are exercises, such as brisk walking or low-impact aerobics, that get your heart pumping.  Range-of-motion activities These keep your joints limber.  Balance and agility exercises These help you maintain daily living skills.  Rest your affected joints as directed by your health care provider.  Follow up with your health care provider as directed. SEEK MEDICAL CARE IF:   Your skin turns red.  You develop a rash in addition to your joint pain.  You have worsening joint pain. SEEK IMMEDIATE MEDICAL CARE IF:  You have a significant loss of weight or appetite.  You have a fever along with joint or muscle aches.  You have   night sweats. FOR MORE INFORMATION  National Institute of Arthritis and Musculoskeletal and Skin Diseases: www.niams.nih.gov National Institute on Aging: www.nia.nih.gov American College of Rheumatology: www.rheumatology.org Document Released: 02/15/2005  Document Revised: 12/06/2012 Document Reviewed: 10/23/2012 [employer] Patient Information 2014 [employer].  

## 2013-07-22 ENCOUNTER — Encounter: Payer: Self-pay | Admitting: Internal Medicine

## 2013-07-22 NOTE — Assessment & Plan Note (Signed)
Since she is not on coumadin anymore so will start an nsaid and cont norco

## 2013-07-22 NOTE — Assessment & Plan Note (Signed)
Will add duexis to norco for additional pain relief

## 2013-10-23 ENCOUNTER — Telehealth: Payer: Self-pay | Admitting: Internal Medicine

## 2013-10-23 NOTE — Telephone Encounter (Signed)
Pt due for office visit, must be seen every three months for controlled Rx. Pt notified office visit is needed/lmovm.

## 2013-10-23 NOTE — Telephone Encounter (Signed)
Pt came by to request Rx refill for HYDROCODONE. Pt states she is almost out. Pt would [facility] Rx [facility] o [facility] on [address]. Please call pt when request is completed.

## 2013-11-29 ENCOUNTER — Encounter (HOSPITAL_COMMUNITY): Payer: Self-pay | Admitting: Emergency Medicine

## 2013-11-29 ENCOUNTER — Emergency Department (HOSPITAL_COMMUNITY)
Admission: EM | Admit: 2013-11-29 | Discharge: 2013-11-29 | Disposition: A | Discharge status: Home / Self Care | Payer: No Typology Code available for payment source | Attending: Emergency Medicine | Admitting: Emergency Medicine

## 2013-11-29 DIAGNOSIS — Z720 Tobacco use: Secondary | ICD-10-CM | POA: Insufficient documentation

## 2013-11-29 DIAGNOSIS — R040 Epistaxis: Secondary | ICD-10-CM | POA: Insufficient documentation

## 2013-11-29 DIAGNOSIS — Z91040 Latex allergy status: Secondary | ICD-10-CM | POA: Diagnosis not present

## 2013-11-29 DIAGNOSIS — J45909 Unspecified asthma, uncomplicated: Secondary | ICD-10-CM | POA: Diagnosis not present

## 2013-11-29 DIAGNOSIS — Z8659 Personal history of other mental and behavioral disorders: Secondary | ICD-10-CM | POA: Insufficient documentation

## 2013-11-29 DIAGNOSIS — M1990 Unspecified osteoarthritis, unspecified site: Secondary | ICD-10-CM | POA: Diagnosis not present

## 2013-11-29 DIAGNOSIS — R011 Cardiac murmur, unspecified: Secondary | ICD-10-CM | POA: Diagnosis not present

## 2013-11-29 DIAGNOSIS — Z8679 Personal history of other diseases of the circulatory system: Secondary | ICD-10-CM | POA: Insufficient documentation

## 2013-11-29 DIAGNOSIS — R110 Nausea: Secondary | ICD-10-CM | POA: Diagnosis not present

## 2013-11-29 LAB — CBC WITH DIFFERENTIAL/PLATELET
BASOS ABS: 0 10*3/uL (ref 0.0–0.1)
BASOS PCT: 0 % (ref 0–1)
EOS ABS: 0.2 10*3/uL (ref 0.0–0.7)
Eosinophils Relative: 3 % (ref 0–5)
HCT: 39.1 % (ref 36.0–46.0)
Hemoglobin: 13.4 g/dL (ref 12.0–15.0)
Lymphocytes Relative: 43 % (ref 12–46)
Lymphs Abs: 3 10*3/uL (ref 0.7–4.0)
MCH: 33.5 pg (ref 26.0–34.0)
MCHC: 34.3 g/dL (ref 30.0–36.0)
MCV: 97.8 fL (ref 78.0–100.0)
Monocytes Absolute: 0.3 10*3/uL (ref 0.1–1.0)
Monocytes Relative: 5 % (ref 3–12)
NEUTROS PCT: 49 % (ref 43–77)
Neutro Abs: 3.5 10*3/uL (ref 1.7–7.7)
PLATELETS: 309 10*3/uL (ref 150–400)
RBC: 4 MIL/uL (ref 3.87–5.11)
RDW: 13.3 % (ref 11.5–15.5)
WBC: 7 10*3/uL (ref 4.0–10.5)

## 2013-11-29 LAB — URINALYSIS, ROUTINE W REFLEX MICROSCOPIC
Bilirubin Urine: NEGATIVE
Glucose, UA: NEGATIVE mg/dL
Ketones, ur: NEGATIVE mg/dL
LEUKOCYTES UA: NEGATIVE
Nitrite: NEGATIVE
PROTEIN: NEGATIVE mg/dL
Specific Gravity, Urine: 1.016 (ref 1.005–1.030)
UROBILINOGEN UA: 0.2 mg/dL (ref 0.0–1.0)
pH: 6 (ref 5.0–8.0)

## 2013-11-29 LAB — BASIC METABOLIC PANEL
Anion gap: 14 (ref 5–15)
BUN: 9 mg/dL (ref 6–23)
CO2: 21 mEq/L (ref 19–32)
Calcium: 8.9 mg/dL (ref 8.4–10.5)
Chloride: 103 mEq/L (ref 96–112)
Creatinine, Ser: 0.5 mg/dL (ref 0.50–1.10)
Glucose, Bld: 90 mg/dL (ref 70–99)
POTASSIUM: 4.1 meq/L (ref 3.7–5.3)
SODIUM: 138 meq/L (ref 137–147)

## 2013-11-29 LAB — URINE MICROSCOPIC-ADD ON

## 2013-11-29 MED ORDER — ACETAMINOPHEN 500 MG PO TABS
1000.0000 mg | ORAL_TABLET | Freq: Once | ORAL | Status: AC
  Administered 2013-11-29: 1000 mg via ORAL
  Filled 2013-11-29: qty 2

## 2013-11-29 MED ORDER — OXYMETAZOLINE HCL 0.05 % NA SOLN: 1.0000 | Freq: Two times a day (BID) | NASAL | Status: DC

## 2013-11-29 NOTE — ED Provider Notes (Signed)
CSN: [id number]     Arrival date & time 11/29/13  6578 History   First MD Initiated Contact with Patient 11/29/13 [phone]     Chief Complaint  Patient presents with  . Epistaxis     (Consider location/radiation/quality/duration/timing/severity/associated sxs/prior Treatment) HPI Denise Morgan is a 48 year old female presenting to the ER with complaint of epistaxis. Patient states she has had intermittent epistaxis for the past month, and last night her nose began bleeding at 3 AM, and bled until 7:30 AM. Patient states although it stopped spontaneously, she wanted to be evaluated in the ER for her ongoing nosebleeds. Patient reports her nosebleeds beginning spontaneously, also with sneezing, or sudden movement or rubbing of her nose. Patient states she tends to blow her nose often, however has not noticed any unusual or increased congestion in the past month. Patient also states she has had some spontaneous epistaxis as a child, when she was 4 and 7. Patient reports some mild associated nausea, vomiting, and states she became nauseated after tilting her head back and allowing the blood to drain posteriorly. Patient denies any associated dizziness, blurred vision, weakness, chest pain, shortness of breath, syncope.   Past Medical History  Diagnosis Date  . Asthma   . Arthritis   . Depression   . Allergy   . History of phlebitis   . Frequent headaches    Past Surgical History  Procedure Laterality Date  . Abdominal hysterectomy  03/01/2004   Family History  Problem Relation Age of Onset  . Arthritis Father    History  Substance Use Topics  . Smoking status: Current Every Day Smoker -- 0.50 packs/day for 30 years    Types: Cigarettes  . Smokeless tobacco: Never Used  . Alcohol Use: 4.2 oz/week    2 Glasses of wine, 5 Shots of liquor per week     Comment: social   OB History   Grav Para Term Preterm Abortions TAB SAB Ect Mult Living                 Review of Systems   Constitutional: Negative for fever.  HENT: Positive for nosebleeds. Negative for trouble swallowing.   Eyes: Negative for visual disturbance.  Respiratory: Negative for shortness of breath.   Cardiovascular: Negative for chest pain.  Gastrointestinal: Positive for nausea. Negative for vomiting and abdominal pain.  Genitourinary: Negative for dysuria.  Musculoskeletal: Negative for neck pain.  Skin: Negative for rash.  Neurological: Negative for dizziness, weakness and numbness.  Psychiatric/Behavioral: Negative.       Allergies  Latex  Home Medications   Prior to Admission medications   Medication Sig Start Date End Date Taking? Authorizing Provider  albuterol (PROVENTIL HFA;VENTOLIN HFA) 108 (90 BASE) MCG/ACT inhaler Inhale 2 puffs into the lungs every 4 (four) hours as needed for wheezing. 11/20/12  Yes Janith Lima, MD  HYDROcodone-acetaminophen (NORCO/VICODIN) 5-325 MG per tablet Take 2 tablets by mouth every 6 (six) hours as needed for moderate pain.   Yes Historical Provider, MD  ibuprofen (ADVIL,MOTRIN) 200 MG tablet Take 600-800 mg by mouth every 6 (six) hours as needed for mild pain or moderate pain.   Yes Historical Provider, MD  Ibuprofen-Famotidine (DUEXIS) 800-26.6 MG TABS Take 1 tablet by mouth 3 (three) times daily as needed (for pain / inflammation).   Yes Historical Provider, MD  oxymetazoline (AFRIN NASAL SPRAY) 0.05 % nasal spray Place 1 spray into both nostrils 2 (two) times daily. Place 1 spray into both nostrils once as  needed for epistaxis. Limit daily use to 3 days in a row. 11/29/13   Carrie Mew, PA-C   BP 152/81  Pulse 71  Temp(Src) 97.7 F (36.5 C) (Oral)  Resp 14  SpO2 100%  LMP 03/01/2004 Physical Exam  Nursing note and vitals reviewed. Constitutional: She is oriented to person, place, and time. She appears well-developed and well-nourished. No distress.  HENT:  Head: Normocephalic and atraumatic.  Nose: Nose normal. No rhinorrhea, nose  lacerations, sinus tenderness or nasal deformity. No epistaxis.  Mouth/Throat: Uvula is midline, oropharynx is clear and moist and mucous membranes are normal. No oropharyngeal exudate, posterior oropharyngeal edema, posterior oropharyngeal erythema or tonsillar abscesses.  Scant blood noted in nares bilaterally.  Eyes: Conjunctivae and EOM are normal. Pupils are equal, round, and reactive to light. Right eye exhibits no discharge. Left eye exhibits no discharge. No scleral icterus.  Neck: Normal range of motion.  Cardiovascular: Normal rate, regular rhythm and S2 normal.   Murmur heard.  Systolic murmur is present with a grade of 2/6  Murmur best heard at right upper sternal border  Pulmonary/Chest: Effort normal and breath sounds normal. No accessory muscle usage. Not tachypneic. No respiratory distress.  Abdominal: Soft. Normal appearance and bowel sounds are normal. There is no tenderness. There is no rigidity, no guarding, no tenderness at McBurney's point and negative Murphy's sign.  Musculoskeletal: Normal range of motion. She exhibits no edema and no tenderness.  Neurological: She is alert and oriented to person, place, and time. No cranial nerve deficit. Coordination normal.  Skin: Skin is warm and dry. No rash noted. She is not diaphoretic.  Psychiatric: She has a normal mood and affect.    ED Course  Procedures (including critical care time) Labs Review Labs Reviewed  URINALYSIS, ROUTINE W REFLEX MICROSCOPIC - Abnormal; Notable for the following:    Hgb urine dipstick TRACE (*)    All other components within normal limits  CBC WITH DIFFERENTIAL  BASIC METABOLIC PANEL  URINE MICROSCOPIC-ADD ON    Imaging Review No results found.   EKG Interpretation   Date/Time:  Thursday November 29 2013 09:33:43 EDT Ventricular Rate:  66 PR Interval:  179 QRS Duration: 83 QT Interval:  420 QTC Calculation: 440 R Axis:   71 Text Interpretation:  Sinus rhythm Sinus rhythm Normal ECG  Confirmed by  Carmin Muskrat  MD ([id number]) on 11/29/2013 9:42:17 AM      MDM   Final diagnoses:  Epistaxis   Patient with one month of intermittent nose bleeds. Here for eval today after nosebleed for approximately 4 hours which is spontaneous and ended spontaneously. Patient reporting bleeding easily with rubbing her nose, sneezing, blowing her nose. Patient hypertensive on arrival to the ER at 171/118 with no previous history of hypertension. Workup for possible hypertensive urgency.  11:30 AM: Patient remaining asymptomatic while in the ER. Labs returned as unremarkable. Platelet count 309, no leukocytosis, no obvious anemia. Patient still mildly hypertensive at 150s over 80s to 90s. EKG unremarkable. We will discharge patient at this time and have her followup with ENT. I discussed return precautions with patient, and encourage her to call or return to the ER should she her symptoms persist, return, worsen or should she have a questions or concerns.  BP 152/81  Pulse 71  Temp(Src) 97.7 F (36.5 C) (Oral)  Resp 14  SpO2 100%  LMP 03/01/2004  Signed,  Dahlia Bailiff, PA-C 4:49 PM   This patient seen and discussed with Dr. Herbie Baltimore  Vanita Panda, M.D.     Carrie Mew, PA-C 11/29/13 1650

## 2013-11-29 NOTE — Discharge Instructions (Signed)

## 2013-11-29 NOTE — ED Notes (Signed)
PA at bedside.

## 2013-11-29 NOTE — ED Notes (Signed)
Pt reports nose bleeds occasionally for the last month. Pt has had hx of this since childhood. Pt states that last night it began bleeding at 3am and bled until 730. Pt states that bleeding will occur with movement, coughing, sneezing. Pt states that she has had N/V with this.

## 2013-11-30 NOTE — ED Provider Notes (Signed)
  This was a shared visit with a mid-level provided (NP or PA).  Throughout the patient's course I was available for consultation/collaboration.  I saw the ECG (if appropriate), relevant labs and studies - I agree with the interpretation.  On my exam the patient was in no distress.  She had no active bleeding. Patient's labs do not demonstrate remarkable from osteopenia commensurate discharged with specialist followup.     Carmin Muskrat, MD 11/30/13 [phone]

## 2013-12-14 ENCOUNTER — Encounter: Payer: Self-pay | Admitting: Internal Medicine

## 2013-12-14 ENCOUNTER — Ambulatory Visit (INDEPENDENT_AMBULATORY_CARE_PROVIDER_SITE_OTHER): Payer: No Typology Code available for payment source | Admitting: Internal Medicine

## 2013-12-14 VITALS — BP 138/82 | HR 83 | Temp 98.6°F | Resp 16 | Ht 66.0 in | Wt 275.0 lb

## 2013-12-14 DIAGNOSIS — M170 Bilateral primary osteoarthritis of knee: Secondary | ICD-10-CM

## 2013-12-14 DIAGNOSIS — G8929 Other chronic pain: Secondary | ICD-10-CM

## 2013-12-14 DIAGNOSIS — M549 Dorsalgia, unspecified: Secondary | ICD-10-CM

## 2013-12-14 DIAGNOSIS — R040 Epistaxis: Secondary | ICD-10-CM

## 2013-12-14 DIAGNOSIS — G894 Chronic pain syndrome: Secondary | ICD-10-CM

## 2013-12-14 MED ORDER — HYDROCODONE-ACETAMINOPHEN 5-325 MG PO TABS: 2.0000 | ORAL_TABLET | Freq: Four times a day (QID) | ORAL | Status: DC | PRN

## 2013-12-14 NOTE — Assessment & Plan Note (Signed)
Will cont norco as needed for pain 

## 2013-12-14 NOTE — Assessment & Plan Note (Signed)
I have asked her to stop smoking and to stop taking any and all nsaids She wants to see ENT

## 2013-12-14 NOTE — Progress Notes (Signed)
Pre visit review using our clinic review tool, if applicable. No additional management support is needed unless otherwise documented below in the visit note. 

## 2013-12-14 NOTE — Patient Instructions (Signed)

## 2013-12-14 NOTE — Progress Notes (Signed)
Subjective:    Patient ID: Denise Morgan, female    DOB: January 31, 1966, 48 y.o.   MRN: [id number]  HPI Comments: She returns for f/up, she was seen in the ER for left side nosebleeds. She has had nosebleeds since childhood but she feels like they have been worse over the last few months. She takes a lot of ibuprofen.     Review of Systems  Constitutional: Negative.  Negative for fever, chills, diaphoresis, activity change, appetite change, fatigue and unexpected weight change.  HENT: Positive for nosebleeds. Negative for congestion, facial swelling, postnasal drip, rhinorrhea, sinus pressure, sneezing, sore throat, tinnitus, trouble swallowing and voice change.   Eyes: Negative.   Respiratory: Negative.  Negative for apnea, cough, choking, chest tightness, shortness of breath, wheezing and stridor.   Cardiovascular: Negative.  Negative for chest pain, palpitations and leg swelling.  Gastrointestinal: Negative.  Negative for nausea, vomiting, abdominal pain, diarrhea, constipation and blood in stool.  Endocrine: Negative.   Genitourinary: Negative.  Negative for hematuria, flank pain and vaginal bleeding.  Musculoskeletal: Positive for arthralgias and back pain. Negative for gait problem, joint swelling, myalgias, neck pain and neck stiffness.  Skin: Negative.  Negative for color change, pallor, rash and wound.  Allergic/Immunologic: Negative.   Neurological: Negative.  Negative for dizziness.  Hematological: Negative.  Negative for adenopathy. Does not bruise/bleed easily.  Psychiatric/Behavioral: Negative.        Objective:   Physical Exam  Vitals reviewed. Constitutional: She is oriented to person, place, and time. She appears well-developed and well-nourished.  Non-toxic appearance. She does not have a sickly appearance. She does not appear ill. No distress.  HENT:  Head: Normocephalic and atraumatic.  Nose: Mucosal edema present. No rhinorrhea, nose lacerations, sinus  tenderness, nasal deformity, septal deviation or nasal septal hematoma. No epistaxis.  No foreign bodies. Right sinus exhibits no maxillary sinus tenderness and no frontal sinus tenderness. Left sinus exhibits no maxillary sinus tenderness and no frontal sinus tenderness.  Mouth/Throat: Oropharynx is clear and moist and mucous membranes are normal. Mucous membranes are not pale, not dry and not cyanotic. No oral lesions. No trismus in the jaw. No uvula swelling. No oropharyngeal exudate, posterior oropharyngeal edema, posterior oropharyngeal erythema or tonsillar abscesses.  Eyes: Conjunctivae are normal. Right eye exhibits no discharge. Left eye exhibits no discharge. No scleral icterus.  Neck: Normal range of motion. Neck supple. No JVD present. No tracheal deviation present. No thyromegaly present.  Cardiovascular: Normal rate, regular rhythm, normal heart sounds and intact distal pulses.  Exam reveals no gallop and no friction rub.   No murmur heard. Pulmonary/Chest: Effort normal and breath sounds normal. No stridor. No respiratory distress. She has no wheezes. She has no rales. She exhibits no tenderness.  Abdominal: Soft. Bowel sounds are normal. She exhibits no distension and no mass. There is no tenderness. There is no rebound and no guarding.  Musculoskeletal: Normal range of motion. She exhibits no edema and no tenderness.  Lymphadenopathy:    She has no cervical adenopathy.  Neurological: She is oriented to person, place, and time.  Skin: Skin is warm and dry. No bruising, no ecchymosis, no petechiae, no purpura and no rash noted. She is not diaphoretic. No erythema. No pallor.  Psychiatric: She has a normal mood and affect. Her behavior is normal. Judgment and thought content normal.     Lab Results  Component Value Date   WBC 7.0 11/29/2013   HGB 13.4 11/29/2013   HCT 39.1 11/29/2013  PLT 309 11/29/2013   GLUCOSE 90 11/29/2013   CHOL 211* 02/16/2013   TRIG 117.0 02/16/2013   HDL  44.80 02/16/2013   LDLDIRECT 155.6 02/16/2013   ALT 24 02/16/2013   AST 21 02/16/2013   NA 138 11/29/2013   K 4.1 11/29/2013   CL 103 11/29/2013   CREATININE 0.50 11/29/2013   BUN 9 11/29/2013   CO2 21 11/29/2013   TSH 1.21 02/16/2013   INR 3.2 11/24/2012       Assessment & Plan:

## 2013-12-17 ENCOUNTER — Telehealth: Payer: Self-pay | Admitting: Internal Medicine

## 2013-12-17 NOTE — Telephone Encounter (Signed)
emmi emailed °

## 2014-04-08 ENCOUNTER — Ambulatory Visit (INDEPENDENT_AMBULATORY_CARE_PROVIDER_SITE_OTHER): Payer: 59 | Admitting: Internal Medicine

## 2014-04-08 ENCOUNTER — Encounter: Payer: Self-pay | Admitting: Internal Medicine

## 2014-04-08 VITALS — BP 144/90 | HR 82 | Temp 98.9°F | Wt 271.0 lb

## 2014-04-08 DIAGNOSIS — H8113 Benign paroxysmal vertigo, bilateral: Secondary | ICD-10-CM

## 2014-04-08 DIAGNOSIS — G894 Chronic pain syndrome: Secondary | ICD-10-CM

## 2014-04-08 DIAGNOSIS — M170 Bilateral primary osteoarthritis of knee: Secondary | ICD-10-CM

## 2014-04-08 DIAGNOSIS — G8929 Other chronic pain: Secondary | ICD-10-CM

## 2014-04-08 DIAGNOSIS — M549 Dorsalgia, unspecified: Secondary | ICD-10-CM

## 2014-04-08 DIAGNOSIS — M25512 Pain in left shoulder: Secondary | ICD-10-CM

## 2014-04-08 DIAGNOSIS — H8110 Benign paroxysmal vertigo, unspecified ear: Secondary | ICD-10-CM | POA: Insufficient documentation

## 2014-04-08 MED ORDER — HYDROCODONE-ACETAMINOPHEN 5-325 MG PO TABS: 2.0000 | ORAL_TABLET | Freq: Four times a day (QID) | ORAL | Status: DC | PRN

## 2014-04-08 MED ORDER — MECLIZINE HCL 12.5 MG PO TABS: 12.5000 mg | ORAL_TABLET | Freq: Three times a day (TID) | ORAL | Status: DC | PRN

## 2014-04-08 NOTE — Progress Notes (Signed)
Subjective:    Patient ID: Denise Morgan, female    DOB: 12/20/1965, 49 y.o.   MRN: [id number]  HPI Comments: She complains of dizziness and vertigo for 2-3 weeks, the symptoms are most severe when she moves her head and when she lays in the bed. She has had these symptoms before and they were successfully treated with meclizine.  She also complains of worsening left shoulder pain for several years, she has limited ROM and tells me that she injured it about 3 years ago and an xray showed that there was a cracked bone in the shoulder. She requests to be referred to a specialist.     Review of Systems  Constitutional: Negative.  Negative for fever, chills, diaphoresis, appetite change and fatigue.  HENT: Negative.   Eyes: Negative.   Respiratory: Negative.  Negative for cough, choking, chest tightness, shortness of breath and stridor.   Cardiovascular: Negative.  Negative for chest pain, palpitations and leg swelling.  Gastrointestinal: Negative.  Negative for nausea, vomiting, abdominal pain, diarrhea and constipation.  Endocrine: Negative.   Genitourinary: Negative.   Musculoskeletal: Positive for arthralgias. Negative for myalgias, back pain, joint swelling, gait problem, neck pain and neck stiffness.  Skin: Negative.  Negative for rash.  Allergic/Immunologic: Negative.   Neurological: Positive for dizziness. Negative for tremors, seizures, syncope, facial asymmetry, speech difficulty, weakness, light-headedness, numbness and headaches.  Hematological: Negative.  Negative for adenopathy. Does not bruise/bleed easily.  Psychiatric/Behavioral: Negative.        Objective:   Physical Exam  Constitutional: She is oriented to person, place, and time. She appears well-developed and well-nourished.  Non-toxic appearance. She does not have a sickly appearance. She does not appear ill. No distress.  HENT:  Head: Normocephalic and atraumatic.  Right Ear: Hearing, tympanic membrane,  external ear and ear canal normal.  Left Ear: Hearing, tympanic membrane, external ear and ear canal normal.  Mouth/Throat: Oropharynx is clear and moist. No oropharyngeal exudate.  Eyes: Conjunctivae and EOM are normal. Pupils are equal, round, and reactive to light. Right eye exhibits no discharge. Left eye exhibits no discharge. No scleral icterus. Right eye exhibits normal extraocular motion and no nystagmus. Left eye exhibits normal extraocular motion and no nystagmus. Right pupil is round and reactive. Left pupil is round and reactive. Pupils are equal.  Neck: Normal range of motion. Neck supple. No JVD present. No tracheal deviation present. No thyromegaly present.  Cardiovascular: Normal rate, regular rhythm, normal heart sounds and intact distal pulses.  Exam reveals no gallop and no friction rub.   No murmur heard. Pulmonary/Chest: Effort normal and breath sounds normal. No stridor. No respiratory distress. She has no wheezes. She has no rales. She exhibits no tenderness.  Abdominal: Soft. Bowel sounds are normal. She exhibits no distension and no mass. There is no tenderness. There is no rebound and no guarding.  Musculoskeletal: She exhibits no edema.       Left shoulder: She exhibits decreased range of motion and tenderness. She exhibits no bony tenderness, no swelling, no effusion, no crepitus, no deformity, no laceration, no pain, no spasm, normal pulse and normal strength.  Lymphadenopathy:    She has no cervical adenopathy.  Neurological: She is alert and oriented to person, place, and time. She has normal strength. She displays no atrophy, no tremor and normal reflexes. No cranial nerve deficit or sensory deficit. She exhibits normal muscle tone. She displays a negative Romberg sign. She displays no seizure activity. Coordination and gait normal.  She displays no Babinski's sign on the right side. She displays no Babinski's sign on the left side.  Reflex Scores:      Tricep reflexes  are 1+ on the right side and 1+ on the left side.      Bicep reflexes are 1+ on the right side and 1+ on the left side.      Brachioradialis reflexes are 1+ on the right side and 1+ on the left side.      Patellar reflexes are 0 on the right side and 0 on the left side.      Achilles reflexes are 0 on the right side and 0 on the left side. Skin: Skin is warm and dry. No rash noted. She is not diaphoretic. No erythema. No pallor.  Vitals reviewed.         Assessment & Plan:

## 2014-04-08 NOTE — Patient Instructions (Signed)

## 2014-04-08 NOTE — Progress Notes (Signed)
Pre visit review using our clinic review tool, if applicable. No additional management support is needed unless otherwise documented below in the visit note. 

## 2014-04-10 NOTE — Assessment & Plan Note (Signed)
She will sports med for further evaluation of this

## 2014-04-10 NOTE — Assessment & Plan Note (Signed)
She has responded well to meclizine in the past Will try this again

## 2014-04-10 NOTE — Assessment & Plan Note (Signed)
She will cont norco as needed for pain 

## 2014-04-18 ENCOUNTER — Ambulatory Visit (INDEPENDENT_AMBULATORY_CARE_PROVIDER_SITE_OTHER): Payer: 59 | Admitting: Family Medicine

## 2014-04-18 ENCOUNTER — Encounter: Payer: Self-pay | Admitting: Family Medicine

## 2014-04-18 ENCOUNTER — Other Ambulatory Visit (INDEPENDENT_AMBULATORY_CARE_PROVIDER_SITE_OTHER): Payer: 59

## 2014-04-18 ENCOUNTER — Ambulatory Visit (INDEPENDENT_AMBULATORY_CARE_PROVIDER_SITE_OTHER)
Admission: RE | Admit: 2014-04-18 | Discharge: 2014-04-18 | Disposition: A | Discharge status: Home / Self Care | Payer: 59 | Source: Ambulatory Visit | Attending: Family Medicine | Admitting: Family Medicine

## 2014-04-18 VITALS — BP 130/84 | HR 70 | Wt 275.0 lb

## 2014-04-18 DIAGNOSIS — M542 Cervicalgia: Secondary | ICD-10-CM

## 2014-04-18 DIAGNOSIS — M7550 Bursitis of unspecified shoulder: Secondary | ICD-10-CM | POA: Insufficient documentation

## 2014-04-18 DIAGNOSIS — M25512 Pain in left shoulder: Secondary | ICD-10-CM

## 2014-04-18 DIAGNOSIS — M7552 Bursitis of left shoulder: Secondary | ICD-10-CM

## 2014-04-18 MED ORDER — CYCLOBENZAPRINE HCL 10 MG PO TABS: 10.0000 mg | ORAL_TABLET | Freq: Every day | ORAL | Status: DC

## 2014-04-18 MED ORDER — MELOXICAM 15 MG PO TABS: 15.0000 mg | ORAL_TABLET | Freq: Every day | ORAL | Status: DC

## 2014-04-18 NOTE — Progress Notes (Signed)
[facility] [address] [place], [place] [address] Phone: [phone] Subjective:    I'm seeing this patient by the request  of:  Scarlette Calico, MD   CC: Left shoulder pain  [id number] Denise Morgan is a 49 y.o. female coming in with complaint of left shoulder pain. Patient has had this pain for years but seems to be getting worse. Patient is more of a dull throbbing aching sensation of this left shoulder that seems to be now constant. Worse with lifting. Patient states though that she also has significant pain that can wake her up at night. Patient puts the severity of pain is 8 out of 10 and not responding over-the-counter medications. Patient states that multiple years ago she did have a fall and may have had a small fracture in her shoulder she states. She does not remember which shoulder this was.     Past medical history, social, surgical and family history all reviewed in electronic medical record.   Review of Systems: No headache, visual changes, nausea, vomiting, diarrhea, constipation, dizziness, abdominal pain, skin rash, fevers, chills, night sweats, weight loss, swollen lymph nodes, body aches, joint swelling, muscle aches, chest pain, shortness of breath, mood changes.   Objective Blood pressure 130/84, pulse 70, weight 275 lb (124.739 kg), last menstrual period 03/01/2004.  General: No apparent distress alert and oriented x3 mood and affect normal, dressed appropriately.  HEENT: Pupils equal, extraocular movements intact  Respiratory: Patient's speak in full sentences and does not appear short of breath  Cardiovascular: No lower extremity edema, non tender, no erythema  Skin: Warm dry intact with no signs of infection or rash on extremities or on axial skeleton.  Abdomen: Soft nontender  Neuro: Cranial nerves II through XII are intact, neurovascularly intact in all extremities with 2+ DTRs and 2+ pulses.  Lymph: No lymphadenopathy of  posterior or anterior cervical chain or axillae bilaterally.  Gait normal with good balance and coordination.  MSK:  Non tender with full range of motion and good stability and symmetric strength and tone of  elbows, wrist, hip, knee and ankles bilaterally.  Neck: Inspection unremarkable. No palpable stepoffs. Mild positive Spurling's maneuver side. Full neck range of motion Grip strength and sensation normal in bilateral hands Strength good C4 to T1 distribution No sensory change to C4 to T1 Negative Hoffman sign bilaterally Reflexes normal Shoulder: left Inspection reveals no abnormalities, atrophy or asymmetry. Palpation is normal with no tenderness over AC joint or bicipital groove. ROM is full in all planes passively. Rotator cuff strength normal throughout. signs of impingement with positive Neer and Hawkin's tests, but negative empty can sign. Speeds and Yergason's tests normal. No labral pathology noted with negative Obrien's, negative clunk and good stability. Normal scapular function observed. No painful arc and no drop arm sign. No apprehension sign Roger lateral shoulder unremarkable  MSK US performed of: left This study was ordered, performed, and interpreted by Charlann Boxer D.O.  Shoulder:   Supraspinatus:  Appears normal on long and transverse views, Bursal bulge seen with shoulder abduction on impingement view. Infraspinatus:  Appears normal on long and transverse views. Significant increase in Doppler flow Subscapularis:  Appears normal on long and transverse views. Positive bursa Teres Minor:  Appears normal on long and transverse views. AC joint:  Capsule undistended, no geyser sign. Glenohumeral Joint:  Appears normal without effusion. Glenoid Labrum:  Intact without visualized tears. Biceps Tendon:  Appears normal on long and transverse views, no  fraying of tendon, tendon located in intertubercular groove, no subluxation with shoulder internal or external  rotation.  Impression: Subacromial bursitis  Procedure: Real-time Ultrasound Guided Injection of left glenohumeral joint Device: GE Logiq E  Ultrasound guided injection is preferred based studies that show increased duration, increased effect, greater accuracy, decreased procedural pain, increased response rate with ultrasound guided versus blind injection.  Verbal informed consent obtained.  Time-out conducted.  Noted no overlying erythema, induration, or other signs of local infection.  Skin prepped in a sterile fashion.  Local anesthesia: Topical Ethyl chloride.  With sterile technique and under real time ultrasound guidance:  Joint visualized.  23g 1  inch needle inserted posterior approach. Pictures taken for needle placement. Patient did have injection of 2 cc of 1% lidocaine, 2 cc of 0.5% Marcaine, and 1.0 cc of Kenalog 40 mg/dL. Completed without difficulty  Pain immediately resolved suggesting accurate placement of the medication.  Advised to call if fevers/chills, erythema, induration, drainage, or persistent bleeding.  Images permanently stored and available for review in the ultrasound unit.  Impression: Technically successful ultrasound guided injection.  Procedure note 81275; 15 minutes spent for Therapeutic exercises as stated in above notes.  This included exercises focusing on stretching, strengthening, with significant focus on eccentric aspects.   Proper technique shown and discussed handout in great detail with ATC.  All questions were discussed and answered.     Impression and Recommendations:     This case required medical decision making of moderate complexity.

## 2014-04-18 NOTE — Assessment & Plan Note (Signed)
Patient was given an injection today. Patient tolerated the procedure well. Discussed icing, home exercises and work with Product/process development scientist. Patient will have x-rays to make sure that there is no bony abnormality that could be contribute in. Patient and will come back in 3 weeks for further evaluation and treatment. Prescriptions as stated.

## 2014-04-18 NOTE — Progress Notes (Signed)
Pre visit review using our clinic review tool, if applicable. No additional management support is needed unless otherwise documented below in the visit note. 

## 2014-04-18 NOTE — Patient Instructions (Addendum)
Good to meet you Ice 20 minutes 2 times daily. Usually after activity and before bed. Exercises 3 times a week.  Vitamin D 2000 IU daily Get xrays downstairs Melxoicam daily for 10 days then as needed Flexeril at night See me again in 3 weeks.

## 2014-04-24 ENCOUNTER — Other Ambulatory Visit: Payer: Self-pay | Admitting: Internal Medicine

## 2014-05-08 ENCOUNTER — Other Ambulatory Visit (HOSPITAL_COMMUNITY)
Admission: RE | Admit: 2014-05-08 | Discharge: 2014-05-08 | Disposition: A | Discharge status: Home / Self Care | Payer: 59 | Source: Ambulatory Visit | Attending: Internal Medicine | Admitting: Internal Medicine

## 2014-05-08 ENCOUNTER — Encounter: Payer: Self-pay | Admitting: Internal Medicine

## 2014-05-08 ENCOUNTER — Ambulatory Visit (INDEPENDENT_AMBULATORY_CARE_PROVIDER_SITE_OTHER): Payer: 59 | Admitting: Internal Medicine

## 2014-05-08 ENCOUNTER — Other Ambulatory Visit (INDEPENDENT_AMBULATORY_CARE_PROVIDER_SITE_OTHER): Payer: 59

## 2014-05-08 ENCOUNTER — Other Ambulatory Visit: Payer: 59

## 2014-05-08 VITALS — BP 134/90 | HR 82 | Temp 98.6°F | Resp 16 | Ht 66.0 in | Wt 270.0 lb

## 2014-05-08 DIAGNOSIS — G894 Chronic pain syndrome: Secondary | ICD-10-CM

## 2014-05-08 DIAGNOSIS — Z124 Encounter for screening for malignant neoplasm of cervix: Secondary | ICD-10-CM

## 2014-05-08 DIAGNOSIS — Z1231 Encounter for screening mammogram for malignant neoplasm of breast: Secondary | ICD-10-CM

## 2014-05-08 DIAGNOSIS — Z0000 Encounter for general adult medical examination without abnormal findings: Secondary | ICD-10-CM | POA: Diagnosis not present

## 2014-05-08 DIAGNOSIS — B353 Tinea pedis: Secondary | ICD-10-CM

## 2014-05-08 DIAGNOSIS — B351 Tinea unguium: Secondary | ICD-10-CM

## 2014-05-08 DIAGNOSIS — M549 Dorsalgia, unspecified: Secondary | ICD-10-CM | POA: Diagnosis not present

## 2014-05-08 DIAGNOSIS — Z01419 Encounter for gynecological examination (general) (routine) without abnormal findings: Secondary | ICD-10-CM | POA: Diagnosis not present

## 2014-05-08 DIAGNOSIS — M170 Bilateral primary osteoarthritis of knee: Secondary | ICD-10-CM

## 2014-05-08 DIAGNOSIS — M25512 Pain in left shoulder: Secondary | ICD-10-CM

## 2014-05-08 DIAGNOSIS — G8929 Other chronic pain: Secondary | ICD-10-CM

## 2014-05-08 LAB — CBC WITH DIFFERENTIAL/PLATELET
BASOS ABS: 0.1 10*3/uL (ref 0.0–0.1)
Basophils Relative: 0.9 % (ref 0.0–3.0)
Eosinophils Absolute: 0.2 10*3/uL (ref 0.0–0.7)
Eosinophils Relative: 2.5 % (ref 0.0–5.0)
HEMATOCRIT: 38.1 % (ref 36.0–46.0)
Hemoglobin: 13.1 g/dL (ref 12.0–15.0)
LYMPHS PCT: 37.3 % (ref 12.0–46.0)
Lymphs Abs: 2.3 10*3/uL (ref 0.7–4.0)
MCHC: 34.4 g/dL (ref 30.0–36.0)
MCV: 95.3 fl (ref 78.0–100.0)
MONO ABS: 0.4 10*3/uL (ref 0.1–1.0)
Monocytes Relative: 6.3 % (ref 3.0–12.0)
Neutro Abs: 3.3 10*3/uL (ref 1.4–7.7)
Neutrophils Relative %: 53 % (ref 43.0–77.0)
Platelets: 284 10*3/uL (ref 150.0–400.0)
RBC: 4 Mil/uL (ref 3.87–5.11)
RDW: 14.2 % (ref 11.5–15.5)
WBC: 6.3 10*3/uL (ref 4.0–10.5)

## 2014-05-08 LAB — LIPID PANEL
Cholesterol: 216 mg/dL — ABNORMAL HIGH (ref 0–200)
HDL: 47.6 mg/dL (ref >39.00)
LDL Cholesterol: 149 mg/dL — ABNORMAL HIGH (ref 0–99)
NonHDL: 168.4
Total CHOL/HDL Ratio: 5
Triglycerides: 96 mg/dL (ref 0.0–149.0)
VLDL: 19.2 mg/dL (ref 0.0–40.0)

## 2014-05-08 LAB — COMPREHENSIVE METABOLIC PANEL
ALT: 31 U/L (ref 0–35)
AST: 20 U/L (ref 0–37)
Albumin: 4 g/dL (ref 3.5–5.2)
Alkaline Phosphatase: 84 U/L (ref 39–117)
BILIRUBIN TOTAL: 0.5 mg/dL (ref 0.2–1.2)
BUN: 14 mg/dL (ref 6–23)
CHLORIDE: 106 meq/L (ref 96–112)
CO2: 24 mEq/L (ref 19–32)
Calcium: 9.2 mg/dL (ref 8.4–10.5)
Creatinine, Ser: 0.6 mg/dL (ref 0.40–1.20)
GFR: 136.68 mL/min (ref >60.00)
Glucose, Bld: 91 mg/dL (ref 70–99)
Potassium: 3.9 mEq/L (ref 3.5–5.1)
Sodium: 136 mEq/L (ref 135–145)
TOTAL PROTEIN: 7.3 g/dL (ref 6.0–8.3)

## 2014-05-08 LAB — TSH: TSH: 1.62 u[IU]/mL (ref 0.35–4.50)

## 2014-05-08 MED ORDER — TRAZODONE HCL 100 MG PO TABS: 100.0000 mg | ORAL_TABLET | Freq: Every day | ORAL | Status: DC

## 2014-05-08 MED ORDER — TERBINAFINE HCL 1 % EX CREA: 1.0000 "application " | TOPICAL_CREAM | Freq: Two times a day (BID) | CUTANEOUS | Status: DC

## 2014-05-08 MED ORDER — HYDROCODONE-ACETAMINOPHEN 5-325 MG PO TABS: 2.0000 | ORAL_TABLET | Freq: Four times a day (QID) | ORAL | Status: DC | PRN

## 2014-05-08 MED ORDER — TERBINAFINE HCL 250 MG PO TABS: 250.0000 mg | ORAL_TABLET | Freq: Every day | ORAL | Status: DC

## 2014-05-08 NOTE — Assessment & Plan Note (Signed)
Will treat this with lamisil cream

## 2014-05-08 NOTE — Progress Notes (Signed)
Subjective:    Patient ID: Denise Morgan, female    DOB: 10-Sep-1965, 49 y.o.   MRN: [id number]  HPI Comments: She came in for a physical but she also complains of an itchy rash on her right foot, between the toes and abnormal toenails that bother her. She needs refills on pain meds as well.     Review of Systems  Constitutional: Negative.   HENT: Negative.   Eyes: Negative.   Respiratory: Negative.  Negative for cough, choking, chest tightness, shortness of breath and stridor.   Cardiovascular: Negative.  Negative for chest pain, palpitations and leg swelling.  Gastrointestinal: Negative.  Negative for nausea, vomiting, abdominal pain, diarrhea, constipation and blood in stool.  Endocrine: Negative.   Genitourinary: Negative.  Negative for dysuria, frequency, decreased urine volume, vaginal bleeding, vaginal discharge, difficulty urinating, menstrual problem and pelvic pain.  Musculoskeletal: Positive for back pain and arthralgias. Negative for myalgias, joint swelling, gait problem, neck pain and neck stiffness.  Skin: Positive for rash. Negative for color change, pallor and wound.  Allergic/Immunologic: Negative.   Neurological: Negative.  Negative for dizziness, tremors, seizures, syncope, facial asymmetry, speech difficulty, weakness, light-headedness, numbness and headaches.  Hematological: Negative.  Negative for adenopathy. Does not bruise/bleed easily.  Psychiatric/Behavioral: Negative.        Objective:   Physical Exam  Constitutional: She is oriented to person, place, and time. She appears well-developed and well-nourished. No distress.  HENT:  Head: Normocephalic and atraumatic.  Mouth/Throat: Oropharynx is clear and moist. No oropharyngeal exudate.  Eyes: Conjunctivae are normal. Right eye exhibits no discharge. Left eye exhibits no discharge. No scleral icterus.  Neck: Normal range of motion. Neck supple. No JVD present. No tracheal deviation present. No thyromegaly  present.  Cardiovascular: Normal rate, regular rhythm, normal heart sounds and intact distal pulses.  Exam reveals no gallop and no friction rub.   No murmur heard. Pulmonary/Chest: Effort normal and breath sounds normal. No stridor. No respiratory distress. She has no wheezes. She has no rales. She exhibits no tenderness.  Abdominal: Soft. Bowel sounds are normal. She exhibits no distension and no mass. There is no tenderness. There is no rebound and no guarding. Hernia confirmed negative in the right inguinal area and confirmed negative in the left inguinal area.  Genitourinary: Rectum normal and vagina normal. Rectal exam shows no external hemorrhoid, no internal hemorrhoid, no fissure, no mass, no tenderness and anal tone normal. Guaiac negative stool. No breast swelling, tenderness, discharge or bleeding. Pelvic exam was performed with patient supine. No labial fusion. There is no rash, tenderness, lesion or injury on the right labia. There is no rash, tenderness, lesion or injury on the left labia. Right adnexum displays no mass, no tenderness and no fullness. Left adnexum displays no mass, no tenderness and no fullness. No erythema, tenderness or bleeding in the vagina. No foreign body around the vagina. No signs of injury around the vagina. No vaginal discharge found.  Musculoskeletal: Normal range of motion. She exhibits no edema or tenderness.  Lymphadenopathy:    She has no cervical adenopathy.       Right: No inguinal adenopathy present.       Left: No inguinal adenopathy present.  Neurological: She is oriented to person, place, and time.  Skin: Skin is warm and dry. Rash noted. She is not diaphoretic. No erythema. No pallor.  8/10 toenails show nail thickening with subungual debris and dark nails  On the right foot, in the web spaces there  is maceration with scale.  Psychiatric: She has a normal mood and affect. Her behavior is normal. Judgment and thought content normal.  Vitals  reviewed.    Lab Results  Component Value Date   WBC 7.0 11/29/2013   HGB 13.4 11/29/2013   HCT 39.1 11/29/2013   PLT 309 11/29/2013   GLUCOSE 90 11/29/2013   CHOL 211* 02/16/2013   TRIG 117.0 02/16/2013   HDL 44.80 02/16/2013   LDLDIRECT 155.6 02/16/2013   ALT 24 02/16/2013   AST 21 02/16/2013   NA 138 11/29/2013   K 4.1 11/29/2013   CL 103 11/29/2013   CREATININE 0.50 11/29/2013   BUN 9 11/29/2013   CO2 21 11/29/2013   TSH 1.21 02/16/2013   INR 3.2 11/24/2012       Assessment & Plan:

## 2014-05-08 NOTE — Assessment & Plan Note (Signed)
Will check her LFT's today - if normal then she will start terbinafine

## 2014-05-08 NOTE — Assessment & Plan Note (Signed)
She refused a flu vax Exam done, PAP collected and sent Labs ordered She was referred for a mammo Pt ed material was given

## 2014-05-08 NOTE — Progress Notes (Signed)
Pre visit review using our clinic review tool, if applicable. No additional management support is needed unless otherwise documented below in the visit note. 

## 2014-05-08 NOTE — Patient Instructions (Signed)
Preventive Care for Adults A healthy lifestyle and preventive care can promote health and wellness. Preventive health guidelines for women include the following key practices.  A routine yearly physical is a good way to check with your health care provider about your health and preventive screening. It is a chance to share any concerns and updates on your health and to receive a thorough exam.  Visit your dentist for a routine exam and preventive care every 6 months. Brush your teeth twice a day and floss once a day. Good oral hygiene prevents tooth decay and gum disease.  The frequency of eye exams is based on your age, health, family medical history, use of contact lenses, and other factors. Follow your health care provider's recommendations for frequency of eye exams.  Eat a healthy diet. Foods like vegetables, fruits, whole grains, low-fat dairy products, and lean protein foods contain the nutrients you need without too many calories. Decrease your intake of foods high in solid fats, added sugars, and salt. Eat the right amount of calories for you.Get information about a proper diet from your health care provider, if necessary.  Regular physical exercise is one of the most important things you can do for your health. Most adults should get at least 150 minutes of moderate-intensity exercise (any activity that increases your heart rate and causes you to sweat) each week. In addition, most adults need muscle-strengthening exercises on 2 or more days a week.  Maintain a healthy weight. The body mass index (BMI) is a screening tool to identify possible weight problems. It provides an estimate of body fat based on height and weight. Your health care provider can find your BMI and can help you achieve or maintain a healthy weight.For adults 20 years and older:  A BMI below 18.5 is considered underweight.  A BMI of 18.5 to 24.9 is normal.  A BMI of 25 to 29.9 is considered overweight.  A BMI of  30 and above is considered obese.  Maintain normal blood lipids and cholesterol levels by exercising and minimizing your intake of saturated fat. Eat a balanced diet with plenty of fruit and vegetables. Blood tests for lipids and cholesterol should begin at age 76 and be repeated every 5 years. If your lipid or cholesterol levels are high, you are over 50, or you are at high risk for heart disease, you may need your cholesterol levels checked more frequently.Ongoing high lipid and cholesterol levels should be treated with medicines if diet and exercise are not working.  If you smoke, find out from your health care provider how to quit. If you do not use tobacco, do not start.  Lung cancer screening is recommended for adults aged 22-80 years who are at high risk for developing lung cancer because of a history of smoking. A yearly low-dose CT scan of the lungs is recommended for people who have at least a 30-pack-year history of smoking and are a current smoker or have quit within the past 15 years. A pack year of smoking is smoking an average of 1 pack of cigarettes a day for 1 year (for example: 1 pack a day for 30 years or 2 packs a day for 15 years). Yearly screening should continue until the smoker has stopped smoking for at least 15 years. Yearly screening should be stopped for people who develop a health problem that would prevent them from having lung cancer treatment.  If you are pregnant, do not drink alcohol. If you are breastfeeding,  be very cautious about drinking alcohol. If you are not pregnant and choose to drink alcohol, do not have more than 1 drink per day. One drink is considered to be 12 ounces (355 mL) of beer, 5 ounces (148 mL) of wine, or 1.5 ounces (44 mL) of liquor.  Avoid use of street drugs. Do not share needles with anyone. Ask for help if you need support or instructions about stopping the use of drugs.  High blood pressure causes heart disease and increases the risk of  stroke. Your blood pressure should be checked at least every 1 to 2 years. Ongoing high blood pressure should be treated with medicines if weight loss and exercise do not work.  If you are 3-86 years old, ask your health care provider if you should take aspirin to prevent strokes.  Diabetes screening involves taking a blood sample to check your fasting blood sugar level. This should be done once every 3 years, after age 67, if you are within normal weight and without risk factors for diabetes. Testing should be considered at a younger age or be carried out more frequently if you are overweight and have at least 1 risk factor for diabetes.  Breast cancer screening is essential preventive care for women. You should practice "breast self-awareness." This means understanding the normal appearance and feel of your breasts and may include breast self-examination. Any changes detected, no matter how small, should be reported to a health care provider. Women in their 8s and 30s should have a clinical breast exam (CBE) by a health care provider as part of a regular health exam every 1 to 3 years. After age 70, women should have a CBE every year. Starting at age 25, women should consider having a mammogram (breast X-ray test) every year. Women who have a family history of breast cancer should talk to their health care provider about genetic screening. Women at a high risk of breast cancer should talk to their health care providers about having an MRI and a mammogram every year.  Breast cancer gene (BRCA)-related cancer risk assessment is recommended for women who have family members with BRCA-related cancers. BRCA-related cancers include breast, ovarian, tubal, and peritoneal cancers. Having family members with these cancers may be associated with an increased risk for harmful changes (mutations) in the breast cancer genes BRCA1 and BRCA2. Results of the assessment will determine the need for genetic counseling and  BRCA1 and BRCA2 testing.  Routine pelvic exams to screen for cancer are no longer recommended for nonpregnant women who are considered low risk for cancer of the pelvic organs (ovaries, uterus, and vagina) and who do not have symptoms. Ask your health care provider if a screening pelvic exam is right for you.  If you have had past treatment for cervical cancer or a condition that could lead to cancer, you need Pap tests and screening for cancer for at least 20 years after your treatment. If Pap tests have been discontinued, your risk factors (such as having a new sexual partner) need to be reassessed to determine if screening should be resumed. Some women have medical problems that increase the chance of getting cervical cancer. In these cases, your health care provider may recommend more frequent screening and Pap tests.  The HPV test is an additional test that may be used for cervical cancer screening. The HPV test looks for the virus that can cause the cell changes on the cervix. The cells collected during the Pap test can be  tested for HPV. The HPV test could be used to screen women aged 30 years and older, and should be used in women of any age who have unclear Pap test results. After the age of 30, women should have HPV testing at the same frequency as a Pap test.  Colorectal cancer can be detected and often prevented. Most routine colorectal cancer screening begins at the age of 50 years and continues through age 75 years. However, your health care provider may recommend screening at an earlier age if you have risk factors for colon cancer. On a yearly basis, your health care provider may provide home test kits to check for hidden blood in the stool. Use of a small camera at the end of a tube, to directly examine the colon (sigmoidoscopy or colonoscopy), can detect the earliest forms of colorectal cancer. Talk to your health care provider about this at age 50, when routine screening begins. Direct  exam of the colon should be repeated every 5-10 years through age 75 years, unless early forms of pre-cancerous polyps or small growths are found.  People who are at an increased risk for hepatitis B should be screened for this virus. You are considered at high risk for hepatitis B if:  You were born in a country where hepatitis B occurs often. Talk with your health care provider about which countries are considered high risk.  Your parents were born in a high-risk country and you have not received a shot to protect against hepatitis B (hepatitis B vaccine).  You have HIV or AIDS.  You use needles to inject street drugs.  You live with, or have sex with, someone who has hepatitis B.  You get hemodialysis treatment.  You take certain medicines for conditions like cancer, organ transplantation, and autoimmune conditions.  Hepatitis C blood testing is recommended for all people born from 1945 through 1965 and any individual with known risks for hepatitis C.  Practice safe sex. Use condoms and avoid high-risk sexual practices to reduce the spread of sexually transmitted infections (STIs). STIs include gonorrhea, chlamydia, syphilis, trichomonas, herpes, HPV, and human immunodeficiency virus (HIV). Herpes, HIV, and HPV are viral illnesses that have no cure. They can result in disability, cancer, and death.  You should be screened for sexually transmitted illnesses (STIs) including gonorrhea and chlamydia if:  You are sexually active and are younger than 24 years.  You are older than 24 years and your health care provider tells you that you are at risk for this type of infection.  Your sexual activity has changed since you were last screened and you are at an increased risk for chlamydia or gonorrhea. Ask your health care provider if you are at risk.  If you are at risk of being infected with HIV, it is recommended that you take a prescription medicine daily to prevent HIV infection. This is  called preexposure prophylaxis (PrEP). You are considered at risk if:  You are a heterosexual woman, are sexually active, and are at increased risk for HIV infection.  You take drugs by injection.  You are sexually active with a partner who has HIV.  Talk with your health care provider about whether you are at high risk of being infected with HIV. If you choose to begin PrEP, you should first be tested for HIV. You should then be tested every 3 months for as long as you are taking PrEP.  Osteoporosis is a disease in which the bones lose minerals and strength   with aging. This can result in serious bone fractures or breaks. The risk of osteoporosis can be identified using a bone density scan. Women ages 65 years and over and women at risk for fractures or osteoporosis should discuss screening with their health care providers. Ask your health care provider whether you should take a calcium supplement or vitamin D to reduce the rate of osteoporosis.  Menopause can be associated with physical symptoms and risks. Hormone replacement therapy is available to decrease symptoms and risks. You should talk to your health care provider about whether hormone replacement therapy is right for you.  Use sunscreen. Apply sunscreen liberally and repeatedly throughout the day. You should seek shade when your shadow is shorter than you. Protect yourself by wearing long sleeves, pants, a wide-brimmed hat, and sunglasses year round, whenever you are outdoors.  Once a month, do a whole body skin exam, using a mirror to look at the skin on your back. Tell your health care provider of new moles, moles that have irregular borders, moles that are larger than a pencil eraser, or moles that have changed in shape or color.  Stay current with required vaccines (immunizations).  Influenza vaccine. All adults should be immunized every year.  Tetanus, diphtheria, and acellular pertussis (Td, Tdap) vaccine. Pregnant women should  receive 1 dose of Tdap vaccine during each pregnancy. The dose should be obtained regardless of the length of time since the last dose. Immunization is preferred during the 27th-36th week of gestation. An adult who has not previously received Tdap or who does not know her vaccine status should receive 1 dose of Tdap. This initial dose should be followed by tetanus and diphtheria toxoids (Td) booster doses every 10 years. Adults with an unknown or incomplete history of completing a 3-dose immunization series with Td-containing vaccines should begin or complete a primary immunization series including a Tdap dose. Adults should receive a Td booster every 10 years.  Varicella vaccine. An adult without evidence of immunity to varicella should receive 2 doses or a second dose if she has previously received 1 dose. Pregnant females who do not have evidence of immunity should receive the first dose after pregnancy. This first dose should be obtained before leaving the health care facility. The second dose should be obtained 4-8 weeks after the first dose.  Human papillomavirus (HPV) vaccine. Females aged 13-26 years who have not received the vaccine previously should obtain the 3-dose series. The vaccine is not recommended for use in pregnant females. However, pregnancy testing is not needed before receiving a dose. If a female is found to be pregnant after receiving a dose, no treatment is needed. In that case, the remaining doses should be delayed until after the pregnancy. Immunization is recommended for any person with an immunocompromised condition through the age of 26 years if she did not get any or all doses earlier. During the 3-dose series, the second dose should be obtained 4-8 weeks after the first dose. The third dose should be obtained 24 weeks after the first dose and 16 weeks after the second dose.  Zoster vaccine. One dose is recommended for adults aged 60 years or older unless certain conditions are  present.  Measles, mumps, and rubella (MMR) vaccine. Adults born before 1957 generally are considered immune to measles and mumps. Adults born in 1957 or later should have 1 or more doses of MMR vaccine unless there is a contraindication to the vaccine or there is laboratory evidence of immunity to   each of the three diseases. A routine second dose of MMR vaccine should be obtained at least 28 days after the first dose for students attending postsecondary schools, health care workers, or international travelers. People who received inactivated measles vaccine or an unknown type of measles vaccine during 1963-1967 should receive 2 doses of MMR vaccine. People who received inactivated mumps vaccine or an unknown type of mumps vaccine before 1979 and are at high risk for mumps infection should consider immunization with 2 doses of MMR vaccine. For females of childbearing age, rubella immunity should be determined. If there is no evidence of immunity, females who are not pregnant should be vaccinated. If there is no evidence of immunity, females who are pregnant should delay immunization until after pregnancy. Unvaccinated health care workers born before 1957 who lack laboratory evidence of measles, mumps, or rubella immunity or laboratory confirmation of disease should consider measles and mumps immunization with 2 doses of MMR vaccine or rubella immunization with 1 dose of MMR vaccine.  Pneumococcal 13-valent conjugate (PCV13) vaccine. When indicated, a person who is uncertain of her immunization history and has no record of immunization should receive the PCV13 vaccine. An adult aged 19 years or older who has certain medical conditions and has not been previously immunized should receive 1 dose of PCV13 vaccine. This PCV13 should be followed with a dose of pneumococcal polysaccharide (PPSV23) vaccine. The PPSV23 vaccine dose should be obtained at least 8 weeks after the dose of PCV13 vaccine. An adult aged 19  years or older who has certain medical conditions and previously received 1 or more doses of PPSV23 vaccine should receive 1 dose of PCV13. The PCV13 vaccine dose should be obtained 1 or more years after the last PPSV23 vaccine dose.  Pneumococcal polysaccharide (PPSV23) vaccine. When PCV13 is also indicated, PCV13 should be obtained first. All adults aged 65 years and older should be immunized. An adult younger than age 65 years who has certain medical conditions should be immunized. Any person who resides in a nursing home or long-term care facility should be immunized. An adult smoker should be immunized. People with an immunocompromised condition and certain other conditions should receive both PCV13 and PPSV23 vaccines. People with human immunodeficiency virus (HIV) infection should be immunized as soon as possible after diagnosis. Immunization during chemotherapy or radiation therapy should be avoided. Routine use of PPSV23 vaccine is not recommended for American Indians, [place] Natives, or people younger than 65 years unless there are medical conditions that require PPSV23 vaccine. When indicated, people who have unknown immunization and have no record of immunization should receive PPSV23 vaccine. One-time revaccination 5 years after the first dose of PPSV23 is recommended for people aged 19-64 years who have chronic kidney failure, nephrotic syndrome, asplenia, or immunocompromised conditions. People who received 1-2 doses of PPSV23 before age 65 years should receive another dose of PPSV23 vaccine at age 65 years or later if at least 5 years have passed since the previous dose. Doses of PPSV23 are not needed for people immunized with PPSV23 at or after age 65 years.  Meningococcal vaccine. Adults with asplenia or persistent complement component deficiencies should receive 2 doses of quadrivalent meningococcal conjugate (MenACWY-D) vaccine. The doses should be obtained at least 2 months apart.  Microbiologists working with certain meningococcal bacteria, military recruits, people at risk during an outbreak, and people who travel to or live in countries with a high rate of meningitis should be immunized. A first-year college student up through age   21 years who is living in a residence hall should receive a dose if she did not receive a dose on or after her 16th birthday. Adults who have certain high-risk conditions should receive one or more doses of vaccine.  Hepatitis A vaccine. Adults who wish to be protected from this disease, have certain high-risk conditions, work with hepatitis A-infected animals, work in hepatitis A research labs, or travel to or work in countries with a high rate of hepatitis A should be immunized. Adults who were previously unvaccinated and who anticipate close contact with an international adoptee during the first 60 days after arrival in the [place] from a country with a high rate of hepatitis A should be immunized.  Hepatitis B vaccine. Adults who wish to be protected from this disease, have certain high-risk conditions, may be exposed to blood or other infectious body fluids, are household contacts or sex partners of hepatitis B positive people, are clients or workers in certain care facilities, or travel to or work in countries with a high rate of hepatitis B should be immunized.  Haemophilus influenzae type b (Hib) vaccine. A previously unvaccinated person with asplenia or sickle cell disease or having a scheduled splenectomy should receive 1 dose of Hib vaccine. Regardless of previous immunization, a recipient of a hematopoietic stem cell transplant should receive a 3-dose series 6-12 months after her successful transplant. Hib vaccine is not recommended for adults with HIV infection. Preventive Services / Frequency Ages 64 to 68 years  Blood pressure check.** / Every 1 to 2 years.  Lipid and cholesterol check.** / Every 5 years beginning at age  22.  Clinical breast exam.** / Every 3 years for women in their 88s and 53s.  BRCA-related cancer risk assessment.** / For women who have family members with a BRCA-related cancer (breast, ovarian, tubal, or peritoneal cancers).  Pap test.** / Every 2 years from ages 90 through 51. Every 3 years starting at age 21 through age 56 or 3 with a history of 3 consecutive normal Pap tests.  HPV screening.** / Every 3 years from ages 24 through ages 1 to 46 with a history of 3 consecutive normal Pap tests.  Hepatitis C blood test.** / For any individual with known risks for hepatitis C.  Skin self-exam. / Monthly.  Influenza vaccine. / Every year.  Tetanus, diphtheria, and acellular pertussis (Tdap, Td) vaccine.** / Consult your health care provider. Pregnant women should receive 1 dose of Tdap vaccine during each pregnancy. 1 dose of Td every 10 years.  Varicella vaccine.** / Consult your health care provider. Pregnant females who do not have evidence of immunity should receive the first dose after pregnancy.  HPV vaccine. / 3 doses over 6 months, if 72 and younger. The vaccine is not recommended for use in pregnant females. However, pregnancy testing is not needed before receiving a dose.  Measles, mumps, rubella (MMR) vaccine.** / You need at least 1 dose of MMR if you were born in 1957 or later. You may also need a 2nd dose. For females of childbearing age, rubella immunity should be determined. If there is no evidence of immunity, females who are not pregnant should be vaccinated. If there is no evidence of immunity, females who are pregnant should delay immunization until after pregnancy.  Pneumococcal 13-valent conjugate (PCV13) vaccine.** / Consult your health care provider.  Pneumococcal polysaccharide (PPSV23) vaccine.** / 1 to 2 doses if you smoke cigarettes or if you have certain conditions.  Meningococcal vaccine.** /  1 dose if you are age 19 to 21 years and a first-year college  student living in a residence hall, or have one of several medical conditions, you need to get vaccinated against meningococcal disease. You may also need additional booster doses.  Hepatitis A vaccine.** / Consult your health care provider.  Hepatitis B vaccine.** / Consult your health care provider.  Haemophilus influenzae type b (Hib) vaccine.** / Consult your health care provider. Ages 40 to 64 years  Blood pressure check.** / Every 1 to 2 years.  Lipid and cholesterol check.** / Every 5 years beginning at age 20 years.  Lung cancer screening. / Every year if you are aged 55-80 years and have a 30-pack-year history of smoking and currently smoke or have quit within the past 15 years. Yearly screening is stopped once you have quit smoking for at least 15 years or develop a health problem that would prevent you from having lung cancer treatment.  Clinical breast exam.** / Every year after age 40 years.  BRCA-related cancer risk assessment.** / For women who have family members with a BRCA-related cancer (breast, ovarian, tubal, or peritoneal cancers).  Mammogram.** / Every year beginning at age 40 years and continuing for as long as you are in good health. Consult with your health care provider.  Pap test.** / Every 3 years starting at age 30 years through age 65 or 70 years with a history of 3 consecutive normal Pap tests.  HPV screening.** / Every 3 years from ages 30 years through ages 65 to 70 years with a history of 3 consecutive normal Pap tests.  Fecal occult blood test (FOBT) of stool. / Every year beginning at age 50 years and continuing until age 75 years. You may not need to do this test if you get a colonoscopy every 10 years.  Flexible sigmoidoscopy or colonoscopy.** / Every 5 years for a flexible sigmoidoscopy or every 10 years for a colonoscopy beginning at age 50 years and continuing until age 75 years.  Hepatitis C blood test.** / For all people born from 1945 through  1965 and any individual with known risks for hepatitis C.  Skin self-exam. / Monthly.  Influenza vaccine. / Every year.  Tetanus, diphtheria, and acellular pertussis (Tdap/Td) vaccine.** / Consult your health care provider. Pregnant women should receive 1 dose of Tdap vaccine during each pregnancy. 1 dose of Td every 10 years.  Varicella vaccine.** / Consult your health care provider. Pregnant females who do not have evidence of immunity should receive the first dose after pregnancy.  Zoster vaccine.** / 1 dose for adults aged 60 years or older.  Measles, mumps, rubella (MMR) vaccine.** / You need at least 1 dose of MMR if you were born in 1957 or later. You may also need a 2nd dose. For females of childbearing age, rubella immunity should be determined. If there is no evidence of immunity, females who are not pregnant should be vaccinated. If there is no evidence of immunity, females who are pregnant should delay immunization until after pregnancy.  Pneumococcal 13-valent conjugate (PCV13) vaccine.** / Consult your health care provider.  Pneumococcal polysaccharide (PPSV23) vaccine.** / 1 to 2 doses if you smoke cigarettes or if you have certain conditions.  Meningococcal vaccine.** / Consult your health care provider.  Hepatitis A vaccine.** / Consult your health care provider.  Hepatitis B vaccine.** / Consult your health care provider.  Haemophilus influenzae type b (Hib) vaccine.** / Consult your health care provider. Ages 65   years and over  Blood pressure check.** / Every 1 to 2 years.  Lipid and cholesterol check.** / Every 5 years beginning at age 14 years.  Lung cancer screening. / Every year if you are aged 44-80 years and have a 30-pack-year history of smoking and currently smoke or have quit within the past 15 years. Yearly screening is stopped once you have quit smoking for at least 15 years or develop a health problem that would prevent you from having lung cancer  treatment.  Clinical breast exam.** / Every year after age 78 years.  BRCA-related cancer risk assessment.** / For women who have family members with a BRCA-related cancer (breast, ovarian, tubal, or peritoneal cancers).  Mammogram.** / Every year beginning at age 68 years and continuing for as long as you are in good health. Consult with your health care provider.  Pap test.** / Every 3 years starting at age 52 years through age 26 or 53 years with 3 consecutive normal Pap tests. Testing can be stopped between 65 and 70 years with 3 consecutive normal Pap tests and no abnormal Pap or HPV tests in the past 10 years.  HPV screening.** / Every 3 years from ages 67 years through ages 72 or 71 years with a history of 3 consecutive normal Pap tests. Testing can be stopped between 65 and 70 years with 3 consecutive normal Pap tests and no abnormal Pap or HPV tests in the past 10 years.  Fecal occult blood test (FOBT) of stool. / Every year beginning at age 34 years and continuing until age 50 years. You may not need to do this test if you get a colonoscopy every 10 years.  Flexible sigmoidoscopy or colonoscopy.** / Every 5 years for a flexible sigmoidoscopy or every 10 years for a colonoscopy beginning at age 77 years and continuing until age 70 years.  Hepatitis C blood test.** / For all people born from [birth year] through 1965 and any individual with known risks for hepatitis C.  Osteoporosis screening.** / A one-time screening for women ages 8 years and over and women at risk for fractures or osteoporosis.  Skin self-exam. / Monthly.  Influenza vaccine. / Every year.  Tetanus, diphtheria, and acellular pertussis (Tdap/Td) vaccine.** / 1 dose of Td every 10 years.  Varicella vaccine.** / Consult your health care provider.  Zoster vaccine.** / 1 dose for adults aged 58 years or older.  Pneumococcal 13-valent conjugate (PCV13) vaccine.** / Consult your health care provider.  Pneumococcal  polysaccharide (PPSV23) vaccine.** / 1 dose for all adults aged 4 years and older.  Meningococcal vaccine.** / Consult your health care provider.  Hepatitis A vaccine.** / Consult your health care provider.  Hepatitis B vaccine.** / Consult your health care provider.  Haemophilus influenzae type b (Hib) vaccine.** / Consult your health care provider. ** Family history and personal history of risk and conditions may change your health care provider's recommendations. Document Released: 04/13/2001 Document Revised: 07/02/2013 Document Reviewed: 07/13/2010 [facility] Patient Information 2015 [place], [place]. This information is not intended to replace advice given to you by your health care provider. Make sure you discuss any questions you have with your health care provider. Ringworm, Nail A fungal infection of the nail (tinea unguium/onychomycosis) is common. It is common as the visible part of the nail is composed of dead cells which have no blood supply to help prevent infection. It occurs because fungi are everywhere and will pick any opportunity to grow on any dead material. Because  nails are very slow growing they require up to 2 years of treatment with anti-fungal medications. The entire nail back to the base is infected. This includes approximately  of the nail which you cannot see. If your caregiver has prescribed a medication by mouth, take it every day and as directed. No progress will be seen for at least 6 to 9 months. Do not be disappointed! Because fungi live on dead cells with little or no exposure to blood supply, medication delivery to the infection is slow; thus the cure is slow. It is also why you can observe no progress in the first 6 months. The nail becoming cured is the base of the nail, as it has the blood supply. Topical medication such as creams and ointments are usually not effective. Important in successful treatment of nail fungus is closely following the medication regimen  that your doctor prescribes. Sometimes you and your caregiver may elect to speed up this process by surgical removal of all the nails. Even this may still require 6 to 9 months of additional oral medications. See your caregiver as directed. Remember there will be no visible improvement for at least 6 months. See your caregiver sooner if other signs of infection (redness and swelling) develop. Document Released: 02/13/2000 Document Revised: 05/10/2011 Document Reviewed: 04/23/2008 [facility] Patient Information 2015 [place], [place]. This information is not intended to replace advice given to you by your health care provider. Make sure you discuss any questions you have with your health care provider.

## 2014-05-08 NOTE — Assessment & Plan Note (Signed)
She will cont norco as needed for pain 

## 2014-05-10 ENCOUNTER — Ambulatory Visit: Payer: 59 | Admitting: Family Medicine

## 2014-05-10 LAB — CYTOLOGY - PAP

## 2014-05-10 LAB — HM PAP SMEAR: HM Pap smear: NORMAL

## 2014-05-10 NOTE — Addendum Note (Signed)
Addended by: Janith Lima on: 05/10/2014 01:44 PM   Modules accepted: Miquel Dunn

## 2014-11-20 ENCOUNTER — Telehealth: Payer: Self-pay | Admitting: Internal Medicine

## 2014-11-20 DIAGNOSIS — M170 Bilateral primary osteoarthritis of knee: Secondary | ICD-10-CM

## 2014-11-20 DIAGNOSIS — G8929 Other chronic pain: Secondary | ICD-10-CM

## 2014-11-20 DIAGNOSIS — M25512 Pain in left shoulder: Secondary | ICD-10-CM

## 2014-11-20 DIAGNOSIS — G894 Chronic pain syndrome: Secondary | ICD-10-CM

## 2014-11-20 DIAGNOSIS — M549 Dorsalgia, unspecified: Principal | ICD-10-CM

## 2014-11-20 NOTE — Telephone Encounter (Signed)
Pt requesting refill for HYDROcodone-acetaminophen (NORCO/VICODIN) 5-325 MG per tablet [[id number]]

## 2014-11-21 MED ORDER — HYDROCODONE-ACETAMINOPHEN 5-325 MG PO TABS: 2.0000 | ORAL_TABLET | Freq: Four times a day (QID) | ORAL | Status: DC | PRN

## 2014-11-21 NOTE — Telephone Encounter (Signed)
Pt informed script at front

## 2014-11-21 NOTE — Telephone Encounter (Signed)
To [employer]

## 2014-11-21 NOTE — Telephone Encounter (Signed)
Done hardcopy to Dahlia  

## 2015-05-09 ENCOUNTER — Other Ambulatory Visit: Payer: Self-pay | Admitting: Internal Medicine

## 2015-05-29 ENCOUNTER — Other Ambulatory Visit (INDEPENDENT_AMBULATORY_CARE_PROVIDER_SITE_OTHER): Payer: BLUE CROSS/BLUE SHIELD

## 2015-05-29 ENCOUNTER — Encounter: Payer: Self-pay | Admitting: Internal Medicine

## 2015-05-29 ENCOUNTER — Ambulatory Visit (INDEPENDENT_AMBULATORY_CARE_PROVIDER_SITE_OTHER): Payer: BLUE CROSS/BLUE SHIELD | Admitting: Internal Medicine

## 2015-05-29 VITALS — BP 120/90 | HR 88 | Temp 98.9°F | Resp 16 | Ht 66.0 in | Wt 264.0 lb

## 2015-05-29 DIAGNOSIS — Z0001 Encounter for general adult medical examination with abnormal findings: Secondary | ICD-10-CM

## 2015-05-29 DIAGNOSIS — R002 Palpitations: Secondary | ICD-10-CM | POA: Diagnosis not present

## 2015-05-29 DIAGNOSIS — Z0000 Encounter for general adult medical examination without abnormal findings: Secondary | ICD-10-CM

## 2015-05-29 DIAGNOSIS — J4520 Mild intermittent asthma, uncomplicated: Secondary | ICD-10-CM

## 2015-05-29 DIAGNOSIS — M25512 Pain in left shoulder: Secondary | ICD-10-CM

## 2015-05-29 DIAGNOSIS — M549 Dorsalgia, unspecified: Secondary | ICD-10-CM

## 2015-05-29 DIAGNOSIS — G894 Chronic pain syndrome: Secondary | ICD-10-CM | POA: Diagnosis not present

## 2015-05-29 DIAGNOSIS — M170 Bilateral primary osteoarthritis of knee: Secondary | ICD-10-CM | POA: Diagnosis not present

## 2015-05-29 DIAGNOSIS — E785 Hyperlipidemia, unspecified: Secondary | ICD-10-CM | POA: Diagnosis not present

## 2015-05-29 DIAGNOSIS — Z1231 Encounter for screening mammogram for malignant neoplasm of breast: Secondary | ICD-10-CM

## 2015-05-29 DIAGNOSIS — G8929 Other chronic pain: Secondary | ICD-10-CM

## 2015-05-29 DIAGNOSIS — B351 Tinea unguium: Secondary | ICD-10-CM

## 2015-05-29 DIAGNOSIS — Z1211 Encounter for screening for malignant neoplasm of colon: Secondary | ICD-10-CM

## 2015-05-29 LAB — CBC WITH DIFFERENTIAL/PLATELET
Basophils Absolute: 0 10*3/uL (ref 0.0–0.1)
Basophils Relative: 0.4 % (ref 0.0–3.0)
EOS PCT: 3.6 % (ref 0.0–5.0)
Eosinophils Absolute: 0.3 10*3/uL (ref 0.0–0.7)
HCT: 41.3 % (ref 36.0–46.0)
HEMOGLOBIN: 14.1 g/dL (ref 12.0–15.0)
Lymphocytes Relative: 39.8 % (ref 12.0–46.0)
Lymphs Abs: 3.5 10*3/uL (ref 0.7–4.0)
MCHC: 34.2 g/dL (ref 30.0–36.0)
MCV: 96.7 fl (ref 78.0–100.0)
MONOS PCT: 4.6 % (ref 3.0–12.0)
Monocytes Absolute: 0.4 10*3/uL (ref 0.1–1.0)
Neutro Abs: 4.6 10*3/uL (ref 1.4–7.7)
Neutrophils Relative %: 51.6 % (ref 43.0–77.0)
Platelets: 279 10*3/uL (ref 150.0–400.0)
RBC: 4.27 Mil/uL (ref 3.87–5.11)
RDW: 14.2 % (ref 11.5–15.5)
WBC: 8.9 10*3/uL (ref 4.0–10.5)

## 2015-05-29 LAB — COMPREHENSIVE METABOLIC PANEL
ALT: 15 U/L (ref 0–35)
AST: 16 U/L (ref 0–37)
Albumin: 4.3 g/dL (ref 3.5–5.2)
Alkaline Phosphatase: 80 U/L (ref 39–117)
BUN: 14 mg/dL (ref 6–23)
CO2: 26 meq/L (ref 19–32)
Calcium: 9.3 mg/dL (ref 8.4–10.5)
Chloride: 105 mEq/L (ref 96–112)
Creatinine, Ser: 0.62 mg/dL (ref 0.40–1.20)
GFR: 131.03 mL/min (ref >60.00)
GLUCOSE: 90 mg/dL (ref 70–99)
POTASSIUM: 3.8 meq/L (ref 3.5–5.1)
Sodium: 138 mEq/L (ref 135–145)
TOTAL PROTEIN: 7.7 g/dL (ref 6.0–8.3)
Total Bilirubin: 0.4 mg/dL (ref 0.2–1.2)

## 2015-05-29 LAB — LIPID PANEL
Cholesterol: 252 mg/dL — ABNORMAL HIGH (ref 0–200)
HDL: 51.8 mg/dL (ref >39.00)
LDL Cholesterol: 173 mg/dL — ABNORMAL HIGH (ref 0–99)
NONHDL: 199.81
Total CHOL/HDL Ratio: 5
Triglycerides: 135 mg/dL (ref 0.0–149.0)
VLDL: 27 mg/dL (ref 0.0–40.0)

## 2015-05-29 LAB — TSH: TSH: 0.93 u[IU]/mL (ref 0.35–4.50)

## 2015-05-29 MED ORDER — HYDROCODONE-ACETAMINOPHEN 5-325 MG PO TABS: 2.0000 | ORAL_TABLET | Freq: Four times a day (QID) | ORAL | Status: DC | PRN

## 2015-05-29 MED ORDER — TRAZODONE HCL 100 MG PO TABS: 100.0000 mg | ORAL_TABLET | Freq: Every day | ORAL | Status: DC

## 2015-05-29 MED ORDER — ALBUTEROL SULFATE HFA 108 (90 BASE) MCG/ACT IN AERS: 2.0000 | INHALATION_SPRAY | RESPIRATORY_TRACT | Status: DC | PRN

## 2015-05-29 NOTE — Progress Notes (Signed)
Pre visit review using our clinic review tool, if applicable. No additional management support is needed unless otherwise documented below in the visit note. 

## 2015-05-29 NOTE — Progress Notes (Signed)
Subjective:  Patient ID: Denise Morgan, female    DOB: December 15, 1965  Age: 50 y.o. MRN: [id number]  CC: Annual Exam; Osteoarthritis; and Palpitations   HPI Denise Morgan presents for a complete physical but she has complaints.  She complains of worsening arthritis pain especially in her right knee. She also has pain in her left knee, hips and feet. She gets symptom relief with Norco and needs a refill.  She also complains of intermittent wheezing and needs a refill on albuterol. She is not willing to use an inhaled corticosteroid.  She's had palpitations for several months in which she describes as intermittent elevated heart rate with an occasional episode of dizziness and shortness of breath. The palpitations really occur at rest and she denies chest pain, syncope, lightheadedness, or edema.  She complains of abnormal toenails and has not gotten any relief with the treatment that I initiated about a year ago.  Outpatient Prescriptions Prior to Visit  Medication Sig Dispense Refill  . albuterol (PROVENTIL HFA;VENTOLIN HFA) 108 (90 BASE) MCG/ACT inhaler Inhale 2 puffs into the lungs every 4 (four) hours as needed for wheezing. 1 Inhaler 11  . HYDROcodone-acetaminophen (NORCO/VICODIN) 5-325 MG per tablet Take 2 tablets by mouth every 6 (six) hours as needed for moderate pain. 90 tablet 0  . cyclobenzaprine (FLEXERIL) 10 MG tablet Take 1 tablet (10 mg total) by mouth at bedtime. 30 tablet 0  . meloxicam (MOBIC) 15 MG tablet Take 1 tablet (15 mg total) by mouth daily. (Patient not taking: Reported on 05/29/2015) 30 tablet 0  . terbinafine (LAMISIL AT) 1 % cream Apply 1 application topically 2 (two) times daily. (Patient not taking: Reported on 05/29/2015) 42 g 5  . terbinafine (LAMISIL) 250 MG tablet Take 1 tablet (250 mg total) by mouth daily. (Patient not taking: Reported on 05/29/2015) 30 tablet 3  . traZODone (DESYREL) 100 MG tablet Take 1 tablet (100 mg total) by mouth at bedtime. Yearly  physical is due must see md for refills (Patient not taking: Reported on 05/29/2015) 30 tablet 0   No facility-administered medications prior to visit.    ROS Review of Systems  Constitutional: Negative.  Negative for fever, chills, diaphoresis, appetite change and fatigue.  HENT: Negative.  Negative for sinus pressure, sore throat and trouble swallowing.   Eyes: Negative.   Respiratory: Positive for shortness of breath. Negative for apnea, cough, choking, chest tightness, wheezing and stridor.   Cardiovascular: Positive for palpitations. Negative for leg swelling.  Gastrointestinal: Negative.  Negative for nausea, vomiting, abdominal pain, diarrhea, constipation and blood in stool.  Endocrine: Negative.   Genitourinary: Negative.   Musculoskeletal: Positive for back pain and arthralgias. Negative for myalgias, joint swelling, gait problem and neck pain.  Skin: Negative.  Negative for color change and rash.  Allergic/Immunologic: Negative.   Neurological: Positive for dizziness.  Hematological: Negative.  Negative for adenopathy. Does not bruise/bleed easily.  Psychiatric/Behavioral: Negative.     Objective:  BP 120/90 mmHg  Pulse 88  Temp(Src) 98.9 F (37.2 C) (Oral)  Ht 5\' 6"  (1.676 m)  Wt 264 lb (119.75 kg)  BMI 42.63 kg/m2  SpO2 97%  LMP 03/01/2004  BP Readings from Last 3 Encounters:  05/29/15 120/90  05/08/14 134/90  04/18/14 130/84    Wt Readings from Last 3 Encounters:  05/29/15 264 lb (119.75 kg)  05/08/14 270 lb (122.471 kg)  04/18/14 275 lb (124.739 kg)    Physical Exam  Constitutional: She is oriented to person, place, and time.  She appears well-developed and well-nourished. No distress.  HENT:  Head: Normocephalic and atraumatic.  Mouth/Throat: Oropharynx is clear and moist. No oropharyngeal exudate.  Eyes: Conjunctivae are normal. Right eye exhibits no discharge. Left eye exhibits no discharge. No scleral icterus.  Neck: Normal range of motion. Neck  supple. No JVD present. No tracheal deviation present. No thyromegaly present.  Cardiovascular: Normal rate, regular rhythm, normal heart sounds and intact distal pulses.  Exam reveals no gallop and no friction rub.   No murmur heard. Pulses:      Carotid pulses are 1+ on the right side, and 1+ on the left side.      Radial pulses are 1+ on the right side, and 1+ on the left side.       Femoral pulses are 1+ on the right side, and 1+ on the left side.      Popliteal pulses are 1+ on the right side, and 1+ on the left side.       Dorsalis pedis pulses are 1+ on the right side, and 1+ on the left side.       Posterior tibial pulses are 1+ on the right side, and 1+ on the left side.  EKG ---  NSR  normal EKG   Pulmonary/Chest: Effort normal and breath sounds normal. No stridor. No respiratory distress. She has no wheezes. She has no rales. She exhibits no tenderness.  Abdominal: Soft. Bowel sounds are normal. She exhibits no distension and no mass. There is no tenderness. There is no rebound and no guarding.  Musculoskeletal: Normal range of motion. She exhibits no edema or tenderness.  Lymphadenopathy:    She has no cervical adenopathy.  Neurological: She is oriented to person, place, and time.  Skin: Skin is warm and dry. No rash noted. She is not diaphoretic. No erythema. No pallor.  Half of her toenails show nail thickening with subungual debris.  Psychiatric: She has a normal mood and affect. Her behavior is normal. Judgment and thought content normal.  Vitals reviewed.   Lab Results  Component Value Date   WBC 8.9 05/29/2015   HGB 14.1 05/29/2015   HCT 41.3 05/29/2015   PLT 279.0 05/29/2015   GLUCOSE 90 05/29/2015   CHOL 252* 05/29/2015   TRIG 135.0 05/29/2015   HDL 51.80 05/29/2015   LDLDIRECT 155.6 02/16/2013   LDLCALC 173* 05/29/2015   ALT 15 05/29/2015   AST 16 05/29/2015   NA 138 05/29/2015   K 3.8 05/29/2015   CL 105 05/29/2015   CREATININE 0.62 05/29/2015   BUN  14 05/29/2015   CO2 26 05/29/2015   TSH 0.93 05/29/2015   INR 3.2 11/24/2012    No results found.  Assessment & Plan:   Denise Morgan was seen today for annual exam, osteoarthritis and palpitations.  Diagnoses and all orders for this visit:  Back pain, chronic -     traZODone (DESYREL) 100 MG tablet; Take 1 tablet (100 mg total) by mouth at bedtime. Yearly physical is due must see md for refills -     Discontinue: HYDROcodone-acetaminophen (NORCO/VICODIN) 5-325 MG tablet; Take 2 tablets by mouth every 6 (six) hours as needed for moderate pain. -     Discontinue: HYDROcodone-acetaminophen (NORCO/VICODIN) 5-325 MG tablet; Take 2 tablets by mouth every 6 (six) hours as needed for moderate pain. -     Discontinue: HYDROcodone-acetaminophen (NORCO/VICODIN) 5-325 MG tablet; Take 2 tablets by mouth every 6 (six) hours as needed for moderate pain. -  HYDROcodone-acetaminophen (NORCO/VICODIN) 5-325 MG tablet; Take 2 tablets by mouth every 6 (six) hours as needed for moderate pain.  Primary osteoarthritis of both knees- will continue current meds for pain and will also refer to orthopedics to see if she is a candidate for surgical intervention. -     Discontinue: HYDROcodone-acetaminophen (NORCO/VICODIN) 5-325 MG tablet; Take 2 tablets by mouth every 6 (six) hours as needed for moderate pain. -     Ambulatory referral to Orthopedic Surgery -     Discontinue: HYDROcodone-acetaminophen (NORCO/VICODIN) 5-325 MG tablet; Take 2 tablets by mouth every 6 (six) hours as needed for moderate pain. -     Discontinue: HYDROcodone-acetaminophen (NORCO/VICODIN) 5-325 MG tablet; Take 2 tablets by mouth every 6 (six) hours as needed for moderate pain. -     HYDROcodone-acetaminophen (NORCO/VICODIN) 5-325 MG tablet; Take 2 tablets by mouth every 6 (six) hours as needed for moderate pain.  Chronic pain syndrome -     Discontinue: HYDROcodone-acetaminophen (NORCO/VICODIN) 5-325 MG tablet; Take 2 tablets by mouth every 6  (six) hours as needed for moderate pain. -     Discontinue: HYDROcodone-acetaminophen (NORCO/VICODIN) 5-325 MG tablet; Take 2 tablets by mouth every 6 (six) hours as needed for moderate pain. -     Discontinue: HYDROcodone-acetaminophen (NORCO/VICODIN) 5-325 MG tablet; Take 2 tablets by mouth every 6 (six) hours as needed for moderate pain. -     HYDROcodone-acetaminophen (NORCO/VICODIN) 5-325 MG tablet; Take 2 tablets by mouth every 6 (six) hours as needed for moderate pain.  Pain of left shoulder joint on movement -     Discontinue: HYDROcodone-acetaminophen (NORCO/VICODIN) 5-325 MG tablet; Take 2 tablets by mouth every 6 (six) hours as needed for moderate pain. -     Discontinue: HYDROcodone-acetaminophen (NORCO/VICODIN) 5-325 MG tablet; Take 2 tablets by mouth every 6 (six) hours as needed for moderate pain. -     Discontinue: HYDROcodone-acetaminophen (NORCO/VICODIN) 5-325 MG tablet; Take 2 tablets by mouth every 6 (six) hours as needed for moderate pain. -     HYDROcodone-acetaminophen (NORCO/VICODIN) 5-325 MG tablet; Take 2 tablets by mouth every 6 (six) hours as needed for moderate pain.  Routine general medical examination at a health care facility- she refused a flu vaccine, she was referred for mammogram and colonoscopy, exam completed, labs ordered and reviewed, patient education material was given.  Onychomycosis of toenail -     Ambulatory referral to Podiatry  Visit for screening mammogram -     MM DIGITAL SCREENING BILATERAL; Future  Screen for colon cancer -     Ambulatory referral to Gastroenterology  Asthma, mild intermittent, uncomplicated -     albuterol (PROVENTIL HFA;VENTOLIN HFA) 108 (90 Base) MCG/ACT inhaler; Inhale 2 puffs into the lungs every 4 (four) hours as needed for wheezing.  Palpitations- her palpitations sound benign, EKG today is normal and labs do not show any secondary metabolic causes for palpitations. I've asked her to wear a 48-hour Holter monitor to  see if she has a significant dysrhythmia. -     EKG 12-Lead -     Holter monitor - 48 hour; Future -     Comprehensive metabolic panel; Future -     TSH; Future  Hyperlipidemia with target LDL less than 130- her Framingham risk was only 4% so I do not recommend that she start a statin. -     Lipid panel; Future -     Comprehensive metabolic panel; Future -     CBC with Differential/Platelet; Future  I have discontinued Denise Morgan's meloxicam, cyclobenzaprine, terbinafine, and terbinafine. I am also having her maintain her traZODone, albuterol, and HYDROcodone-acetaminophen.  Meds ordered this encounter  Medications  . traZODone (DESYREL) 100 MG tablet    Sig: Take 1 tablet (100 mg total) by mouth at bedtime. Yearly physical is due must see md for refills    Dispense:  90 tablet    Refill:  3  . DISCONTD: HYDROcodone-acetaminophen (NORCO/VICODIN) 5-325 MG tablet    Sig: Take 2 tablets by mouth every 6 (six) hours as needed for moderate pain.    Dispense:  90 tablet    Refill:  0  . albuterol (PROVENTIL HFA;VENTOLIN HFA) 108 (90 Base) MCG/ACT inhaler    Sig: Inhale 2 puffs into the lungs every 4 (four) hours as needed for wheezing.    Dispense:  1 Inhaler    Refill:  11  . DISCONTD: HYDROcodone-acetaminophen (NORCO/VICODIN) 5-325 MG tablet    Sig: Take 2 tablets by mouth every 6 (six) hours as needed for moderate pain.    Dispense:  90 tablet    Refill:  0  . DISCONTD: HYDROcodone-acetaminophen (NORCO/VICODIN) 5-325 MG tablet    Sig: Take 2 tablets by mouth every 6 (six) hours as needed for moderate pain.    Dispense:  90 tablet    Refill:  0  . HYDROcodone-acetaminophen (NORCO/VICODIN) 5-325 MG tablet    Sig: Take 2 tablets by mouth every 6 (six) hours as needed for moderate pain.    Dispense:  90 tablet    Refill:  0     Follow-up: Return in about 6 weeks (around 07/10/2015).  Scarlette Calico, MD

## 2015-05-29 NOTE — Patient Instructions (Signed)

## 2015-05-30 ENCOUNTER — Encounter: Payer: Self-pay | Admitting: Internal Medicine

## 2015-06-13 ENCOUNTER — Ambulatory Visit (INDEPENDENT_AMBULATORY_CARE_PROVIDER_SITE_OTHER): Payer: BLUE CROSS/BLUE SHIELD

## 2015-06-13 DIAGNOSIS — R002 Palpitations: Secondary | ICD-10-CM | POA: Diagnosis not present

## 2015-06-21 ENCOUNTER — Encounter: Payer: Self-pay | Admitting: Internal Medicine

## 2015-06-30 ENCOUNTER — Ambulatory Visit
Admission: RE | Admit: 2015-06-30 | Discharge: 2015-06-30 | Disposition: A | Discharge status: Home / Self Care | Payer: BLUE CROSS/BLUE SHIELD | Source: Ambulatory Visit | Attending: Internal Medicine | Admitting: Internal Medicine

## 2015-06-30 DIAGNOSIS — Z1231 Encounter for screening mammogram for malignant neoplasm of breast: Secondary | ICD-10-CM | POA: Diagnosis not present

## 2015-07-02 ENCOUNTER — Ambulatory Visit: Payer: BLUE CROSS/BLUE SHIELD | Admitting: Podiatry

## 2015-07-02 LAB — HM MAMMOGRAPHY

## 2015-07-02 NOTE — Addendum Note (Signed)
Addended by: Janith Lima on: 07/02/2015 09:04 AM   Modules accepted: Miquel Dunn

## 2015-07-07 DIAGNOSIS — M25562 Pain in left knee: Secondary | ICD-10-CM | POA: Diagnosis not present

## 2015-07-07 DIAGNOSIS — M25561 Pain in right knee: Secondary | ICD-10-CM | POA: Diagnosis not present

## 2015-07-07 DIAGNOSIS — M5442 Lumbago with sciatica, left side: Secondary | ICD-10-CM | POA: Diagnosis not present

## 2015-07-07 DIAGNOSIS — M5441 Lumbago with sciatica, right side: Secondary | ICD-10-CM | POA: Diagnosis not present

## 2015-07-10 ENCOUNTER — Other Ambulatory Visit: Payer: Self-pay | Admitting: *Deleted

## 2015-07-10 DIAGNOSIS — G8929 Other chronic pain: Secondary | ICD-10-CM

## 2015-07-10 DIAGNOSIS — M549 Dorsalgia, unspecified: Principal | ICD-10-CM

## 2015-07-10 MED ORDER — TRAZODONE HCL 100 MG PO TABS: 100.0000 mg | ORAL_TABLET | Freq: Every day | ORAL | Status: DC

## 2015-07-10 NOTE — Telephone Encounter (Signed)
Receive call pt states she had her physical back in march needing to get refill on her Trazodone. Verified pharmacy inform pt will send to [facility].../lmb

## 2015-08-01 ENCOUNTER — Ambulatory Visit (AMBULATORY_SURGERY_CENTER): Payer: Self-pay | Admitting: *Deleted

## 2015-08-01 VITALS — Ht 66.0 in | Wt 270.0 lb

## 2015-08-01 DIAGNOSIS — Z1211 Encounter for screening for malignant neoplasm of colon: Secondary | ICD-10-CM

## 2015-08-01 NOTE — Progress Notes (Signed)
No egg allergy but she doesn't like them - eats in cakes and pies with no issues  or soy allergy known to patient  No issues with past sedation with any surgeries  or procedures, no intubation problems  No diet pills per patient No home 02 use per patient  No blood thinners per patient  Pt denies issues with constipation  emmi video to e mail

## 2015-08-04 ENCOUNTER — Encounter: Payer: Self-pay | Admitting: Internal Medicine

## 2015-08-15 ENCOUNTER — Ambulatory Visit (AMBULATORY_SURGERY_CENTER): Payer: BLUE CROSS/BLUE SHIELD | Admitting: Internal Medicine

## 2015-08-15 ENCOUNTER — Encounter: Payer: Self-pay | Admitting: Internal Medicine

## 2015-08-15 VITALS — BP 126/78 | HR 68 | Temp 97.5°F | Resp 16 | Ht 66.0 in | Wt 270.0 lb

## 2015-08-15 DIAGNOSIS — Z1211 Encounter for screening for malignant neoplasm of colon: Secondary | ICD-10-CM | POA: Diagnosis present

## 2015-08-15 DIAGNOSIS — D120 Benign neoplasm of cecum: Secondary | ICD-10-CM | POA: Diagnosis not present

## 2015-08-15 DIAGNOSIS — Z86010 Personal history of colonic polyps: Secondary | ICD-10-CM

## 2015-08-15 DIAGNOSIS — Z860101 Personal history of adenomatous and serrated colon polyps: Secondary | ICD-10-CM | POA: Insufficient documentation

## 2015-08-15 HISTORY — DX: Personal history of adenomatous and serrated colon polyps: Z86.0101

## 2015-08-15 HISTORY — DX: Personal history of colonic polyps: Z86.010

## 2015-08-15 LAB — HM COLONOSCOPY

## 2015-08-15 MED ORDER — SODIUM CHLORIDE 0.9 % IV SOLN: 500.0000 mL | INTRAVENOUS | Status: DC

## 2015-08-15 NOTE — Op Note (Signed)
[facility] Patient Name: Denise Morgan Procedure Date: 08/15/2015 9:25 AM MRN: [id number] Endoscopist: Gatha Mayer , MD Age: 50 Referring MD:  Date of Birth: Dec 23, 1965 Gender: Female Procedure:                Colonoscopy Indications:              Screening for colorectal malignant neoplasm Medicines:                Propofol per Anesthesia, Monitored Anesthesia Care Procedure:                Pre-Anesthesia Assessment:                           - Prior to the procedure, a History and Physical                            was performed, and patient medications and                            allergies were reviewed. The patient's tolerance of                            previous anesthesia was also reviewed. The risks                            and benefits of the procedure and the sedation                            options and risks were discussed with the patient.                            All questions were answered, and informed consent                            was obtained. Prior Anticoagulants: The patient has                            taken no previous anticoagulant or antiplatelet                            agents. ASA Grade Assessment: II - A patient with                            mild systemic disease. After reviewing the risks                            and benefits, the patient was deemed in                            satisfactory condition to undergo the procedure.                           After obtaining informed consent, the colonoscope  was passed under direct vision. Throughout the                            procedure, the patient's blood pressure, pulse, and                            oxygen saturations were monitored continuously. The                            Model CF-HQ190L [id number]) scope was introduced                            through the anus and advanced to the the cecum,                            identified by  appendiceal orifice and ileocecal                            valve. The quality of the bowel preparation was                            excellent. The ileocecal valve, appendiceal                            orifice, and rectum were photographed. Scope In: 9:33:51 AM Scope Out: 9:44:05 AM Scope Withdrawal Time: 0 hours 8 minutes 47 seconds  Total Procedure Duration: 0 hours 10 minutes 14 seconds  Findings:                 A 5 mm polyp was found in the cecum. The polyp was                            sessile. The polyp was removed with a cold snare.                            Resection and retrieval were complete.                           The exam was otherwise without abnormality on                            direct and retroflexion views. Complications:            No immediate complications. Estimated blood loss:                            None. Estimated Blood Loss:     Estimated blood loss was minimal. Impression:               - One 5 mm polyp in the cecum, removed with a cold                            snare. Resected and retrieved.                           -  The examination was otherwise normal on direct                            and retroflexion views. Recommendation:           - Patient has a contact number available for                            emergencies. The signs and symptoms of potential                            delayed complications were discussed with the                            patient. Return to normal activities tomorrow.                            Written discharge instructions were provided to the                            patient.                           - Resume previous diet.                           - Continue present medications.                           - Repeat colonoscopy is recommended [Repeat                            reason]. The colonoscopy date will be determined                            after pathology results from today's exam become                             available for review. Gatha Mayer, MD 08/15/2015 9:49:06 AM This report has been signed electronically.

## 2015-08-15 NOTE — Progress Notes (Signed)
Called to room to assist during endoscopic procedure.  Patient ID and intended procedure confirmed with present staff. Received instructions for my participation in the procedure from the performing physician.  

## 2015-08-15 NOTE — Progress Notes (Signed)
Report to PACU, RN, vss, BBS= Clear.  

## 2015-08-15 NOTE — Addendum Note (Signed)
Addended by: Janith Lima on: 08/15/2015 03:50 PM   Modules accepted: Miquel Dunn

## 2015-08-15 NOTE — Patient Instructions (Addendum)
I found and removed one small polyp today - it looks benign. I will let you know pathology results and when to have another routine colonoscopy by mail. It will take until July for me to get you the results because I will be away for the next 2 weeks.  I appreciate the opportunity to care for you. Gatha Mayer, MD, FACG  YOU HAD AN ENDOSCOPIC PROCEDURE TODAY AT [facility]:   Refer to the procedure report that was given to you for any specific questions about what was found during the examination.  If the procedure report does not answer your questions, please call your gastroenterologist to clarify.  If you requested that your care partner not be given the details of your procedure findings, then the procedure report has been included in a sealed envelope for you to review at your convenience later.  YOU SHOULD EXPECT: Some feelings of bloating in the abdomen. Passage of more gas than usual.  Walking can help get rid of the air that was put into your GI tract during the procedure and reduce the bloating. If you had a lower endoscopy (such as a colonoscopy or flexible sigmoidoscopy) you may notice spotting of blood in your stool or on the toilet paper. If you underwent a bowel prep for your procedure, you may not have a normal bowel movement for a few days.  Please Note:  You might notice some irritation and congestion in your nose or some drainage.  This is from the oxygen used during your procedure.  There is no need for concern and it should clear up in a day or so.  SYMPTOMS TO REPORT IMMEDIATELY:   Following lower endoscopy (colonoscopy or flexible sigmoidoscopy):  Excessive amounts of blood in the stool  Significant tenderness or worsening of abdominal pains  Swelling of the abdomen that is new, acute  Fever of 100F or higher   For urgent or emergent issues, a gastroenterologist can be reached at any hour by calling [phone].   DIET: Your first meal  following the procedure should be a small meal and then it is ok to progress to your normal diet. Heavy or fried foods are harder to digest and may make you feel nauseous or bloated.  Likewise, meals heavy in dairy and vegetables can increase bloating.  Drink plenty of fluids but you should avoid alcoholic beverages for 24 hours.  ACTIVITY:  You should plan to take it easy for the rest of today and you should NOT DRIVE or use heavy machinery until tomorrow (because of the sedation medicines used during the test).    FOLLOW UP: Our staff will call the number listed on your records the next business day following your procedure to check on you and address any questions or concerns that you may have regarding the information given to you following your procedure. If we do not reach you, we will leave a message.  However, if you are feeling well and you are not experiencing any problems, there is no need to return our call.  We will assume that you have returned to your regular daily activities without incident.  If any biopsies were taken you will be contacted by phone or by letter within the next 1-3 weeks.  Please call us at [phone] if you have not heard about the biopsies in 3 weeks.    SIGNATURES/CONFIDENTIALITY: You and/or your care partner have signed paperwork which will be entered into your electronic  medical record.  These signatures attest to the fact that that the information above on your After Visit Summary has been reviewed and is understood.  Full responsibility of the confidentiality of this discharge information lies with you and/or your care-partner.  Please review polyp handout provided.

## 2015-08-18 ENCOUNTER — Telehealth: Payer: Self-pay

## 2015-08-18 NOTE — Telephone Encounter (Signed)
  Follow up Call-  Call back number 08/15/2015  Post procedure Call Back phone  # [phone]  Permission to leave phone message Yes     Patient questions:  Do you have a fever, pain , or abdominal swelling? No. Pain Score  0 *  Have you tolerated food without any problems? Yes.    Have you been able to return to your normal activities? Yes.    Do you have any questions about your discharge instructions: Diet   No. Medications  No. Follow up visit  No.  Do you have questions or concerns about your Care? No.  Actions: * If pain score is 4 or above: No action needed, pain <4.

## 2015-09-03 ENCOUNTER — Encounter: Payer: Self-pay | Admitting: Internal Medicine

## 2015-09-03 NOTE — Progress Notes (Signed)
Quick Note:  5 mm adenoma Recall colonoscopy 2022 - 5 yrs My Chart message sent - no letter ______

## 2015-09-09 LAB — HM COLONOSCOPY

## 2015-09-11 NOTE — Addendum Note (Signed)
Addended by: Janith Lima on: 09/11/2015 10:33 AM   Modules accepted: Miquel Dunn

## 2015-11-06 ENCOUNTER — Other Ambulatory Visit: Payer: Self-pay | Admitting: Emergency Medicine

## 2015-11-06 ENCOUNTER — Other Ambulatory Visit: Payer: Self-pay | Admitting: Internal Medicine

## 2015-11-06 DIAGNOSIS — G894 Chronic pain syndrome: Secondary | ICD-10-CM

## 2015-11-06 DIAGNOSIS — G8929 Other chronic pain: Secondary | ICD-10-CM

## 2015-11-06 DIAGNOSIS — M25512 Pain in left shoulder: Secondary | ICD-10-CM

## 2015-11-06 DIAGNOSIS — M549 Dorsalgia, unspecified: Principal | ICD-10-CM

## 2015-11-06 DIAGNOSIS — M170 Bilateral primary osteoarthritis of knee: Secondary | ICD-10-CM

## 2015-11-06 MED ORDER — HYDROCODONE-ACETAMINOPHEN 5-325 MG PO TABS: 2.0000 | ORAL_TABLET | Freq: Four times a day (QID) | ORAL | 0 refills | Status: DC | PRN

## 2015-11-06 NOTE — Telephone Encounter (Signed)
Pt called and needs a prescription refill on traZODone (DESYREL) 100 MG tablet and HYDROcodone-acetaminophen (NORCO/VICODIN) 5-325 MG tablet. A 3 mo supply if possible. Thanks.

## 2015-11-06 NOTE — Telephone Encounter (Signed)
Called pt no answer left detail msg on vm w/MD response. Place rx up front for pick-up...Denise Morgan

## 2015-11-06 NOTE — Telephone Encounter (Signed)
Pt rq Hydrocodone and trazadone.  Pt should not need trazadone.  Please advise.

## 2015-11-06 NOTE — Telephone Encounter (Signed)
I don't know what to say about the trazodone since she was given a years worth in May. I have not seen her in 6 months only gave her 1 month's worth of the hydrocodone. She is obviously due for an office visit

## 2015-11-07 NOTE — Telephone Encounter (Signed)
LVM for pt to call back as soon as possible.   RE: Pt needs an appt for refills of hydrocodone. Pharmacy stated that pt has refills of trazadone. ([facility]).

## 2015-12-22 ENCOUNTER — Encounter: Payer: Self-pay | Admitting: Internal Medicine

## 2015-12-22 ENCOUNTER — Ambulatory Visit (INDEPENDENT_AMBULATORY_CARE_PROVIDER_SITE_OTHER): Payer: Self-pay | Admitting: Internal Medicine

## 2015-12-22 VITALS — BP 134/80 | HR 76 | Temp 98.4°F | Resp 16 | Ht 66.0 in | Wt 283.2 lb

## 2015-12-22 DIAGNOSIS — G894 Chronic pain syndrome: Secondary | ICD-10-CM

## 2015-12-22 DIAGNOSIS — M160 Bilateral primary osteoarthritis of hip: Secondary | ICD-10-CM

## 2015-12-22 DIAGNOSIS — E785 Hyperlipidemia, unspecified: Secondary | ICD-10-CM

## 2015-12-22 DIAGNOSIS — M170 Bilateral primary osteoarthritis of knee: Secondary | ICD-10-CM

## 2015-12-22 DIAGNOSIS — M25512 Pain in left shoulder: Secondary | ICD-10-CM

## 2015-12-22 DIAGNOSIS — M549 Dorsalgia, unspecified: Secondary | ICD-10-CM

## 2015-12-22 DIAGNOSIS — G8929 Other chronic pain: Secondary | ICD-10-CM

## 2015-12-22 MED ORDER — HYDROCODONE-ACETAMINOPHEN 5-325 MG PO TABS: 2.0000 | ORAL_TABLET | Freq: Four times a day (QID) | ORAL | 0 refills | Status: DC | PRN

## 2015-12-22 NOTE — Progress Notes (Signed)
Subjective:  Patient ID: Denise Morgan, female    DOB: June 20, 1965  Age: 50 y.o. MRN: [id number]  CC: Osteoarthritis   HPI Makilah Goodall presents for management of chronic pain from osteoarthritis. She complains of intermittent pain in her large joints including the shoulders, hips, knees, and ankles. She rarely has pain in her finger joints. Her joints are never red or swollen. She gets adequate symptom relief with hydrocodone, Tylenol, and Aleve.  Outpatient Medications Prior to Visit  Medication Sig Dispense Refill  . albuterol (PROVENTIL HFA;VENTOLIN HFA) 108 (90 Base) MCG/ACT inhaler Inhale 2 puffs into the lungs every 4 (four) hours as needed for wheezing. 1 Inhaler 11  . Naproxen Sodium (ALEVE PO) Take 2 capsules by mouth 2 (two) times daily before a meal.    . traZODone (DESYREL) 100 MG tablet Take 1 tablet (100 mg total) by mouth at bedtime. 90 tablet 3  . HYDROcodone-acetaminophen (NORCO/VICODIN) 5-325 MG tablet Take 2 tablets by mouth every 6 (six) hours as needed for moderate pain. 90 tablet 0   No facility-administered medications prior to visit.     ROS Review of Systems  Constitutional: Negative.   HENT: Negative.   Eyes: Negative.  Negative for visual disturbance.  Respiratory: Negative for cough, choking, chest tightness, shortness of breath and stridor.   Cardiovascular: Negative for chest pain, palpitations and leg swelling.  Gastrointestinal: Negative.  Negative for abdominal pain, constipation, diarrhea, nausea and vomiting.  Genitourinary: Negative.  Negative for difficulty urinating.  Musculoskeletal: Positive for arthralgias. Negative for gait problem, joint swelling, myalgias and neck pain.  Skin: Negative.  Negative for color change.  Neurological: Negative.  Negative for dizziness, weakness, light-headedness and headaches.  Hematological: Negative.  Negative for adenopathy. Does not bruise/bleed easily.  Psychiatric/Behavioral: Negative.      Objective:  BP 134/80 (BP Location: Left Arm, Patient Position: Sitting, Cuff Size: Large)   Pulse 76   Temp 98.4 F (36.9 C) (Oral)   Resp 16   Ht 5\' 6"  (1.676 m)   Wt 283 lb 4 oz (128.5 kg)   LMP 03/01/2004   SpO2 97%   BMI 45.72 kg/m   BP Readings from Last 3 Encounters:  12/22/15 134/80  08/15/15 126/78  05/29/15 120/90    Wt Readings from Last 3 Encounters:  12/22/15 283 lb 4 oz (128.5 kg)  08/15/15 270 lb (122.5 kg)  08/01/15 270 lb (122.5 kg)    Physical Exam  Constitutional: She is oriented to person, place, and time. No distress.  HENT:  Head: Normocephalic and atraumatic.  Mouth/Throat: Oropharynx is clear and moist. No oropharyngeal exudate.  Eyes: Conjunctivae are normal. Right eye exhibits no discharge. Left eye exhibits no discharge. No scleral icterus.  Neck: Normal range of motion. Neck supple. No JVD present. No tracheal deviation present. No thyromegaly present.  Cardiovascular: Normal rate, regular rhythm and intact distal pulses.  Exam reveals no gallop and no friction rub.   No murmur heard. Pulmonary/Chest: Effort normal and breath sounds normal. No stridor. No respiratory distress. She has no wheezes. She has no rales. She exhibits no tenderness.  Abdominal: Soft. Bowel sounds are normal. She exhibits no distension and no mass. There is no tenderness. There is no rebound and no guarding.  Musculoskeletal: Normal range of motion. She exhibits deformity (DJD in multiple joints). She exhibits no edema or tenderness.  Lymphadenopathy:    She has no cervical adenopathy.  Neurological: She is oriented to person, place, and time.  Skin: Skin is  warm and dry. No rash noted. She is not diaphoretic. No erythema. No pallor.  Vitals reviewed.   Lab Results  Component Value Date   WBC 8.9 05/29/2015   HGB 14.1 05/29/2015   HCT 41.3 05/29/2015   PLT 279.0 05/29/2015   GLUCOSE 90 05/29/2015   CHOL 252 (H) 05/29/2015   TRIG 135.0 05/29/2015   HDL  51.80 05/29/2015   LDLDIRECT 155.6 02/16/2013   LDLCALC 173 (H) 05/29/2015   ALT 15 05/29/2015   AST 16 05/29/2015   NA 138 05/29/2015   K 3.8 05/29/2015   CL 105 05/29/2015   CREATININE 0.62 05/29/2015   BUN 14 05/29/2015   CO2 26 05/29/2015   TSH 0.93 05/29/2015   INR 3.2 11/24/2012    Mm Digital Screening Bilateral  Result Date: 07/01/2015 CLINICAL DATA:  Screening. EXAM: DIGITAL SCREENING BILATERAL MAMMOGRAM WITH CAD COMPARISON:  Previous exam(s). ACR Breast Density Category b: There are scattered areas of fibroglandular density. FINDINGS: There are no findings suspicious for malignancy. Images were processed with CAD. IMPRESSION: No mammographic evidence of malignancy. A result letter of this screening mammogram will be mailed directly to the patient. RECOMMENDATION: Screening mammogram in one year. (Code:SM-B-01Y) BI-RADS CATEGORY  1: Negative. Electronically Signed   By: Lovey Newcomer M.D.   On: 07/01/2015 17:47    Assessment & Plan:   Devana was seen today for osteoarthritis.  Diagnoses and all orders for this visit:  Primary osteoarthritis of both hips -     Discontinue: HYDROcodone-acetaminophen (NORCO/VICODIN) 5-325 MG tablet; Take 2 tablets by mouth every 6 (six) hours as needed for moderate pain. -     Discontinue: HYDROcodone-acetaminophen (NORCO/VICODIN) 5-325 MG tablet; Take 2 tablets by mouth every 6 (six) hours as needed for moderate pain. -     HYDROcodone-acetaminophen (NORCO/VICODIN) 5-325 MG tablet; Take 2 tablets by mouth every 6 (six) hours as needed for moderate pain.  Hyperlipidemia with target LDL less than 130  Primary osteoarthritis of both knees -     Discontinue: HYDROcodone-acetaminophen (NORCO/VICODIN) 5-325 MG tablet; Take 2 tablets by mouth every 6 (six) hours as needed for moderate pain. -     Discontinue: HYDROcodone-acetaminophen (NORCO/VICODIN) 5-325 MG tablet; Take 2 tablets by mouth every 6 (six) hours as needed for moderate pain. -      HYDROcodone-acetaminophen (NORCO/VICODIN) 5-325 MG tablet; Take 2 tablets by mouth every 6 (six) hours as needed for moderate pain.  Chronic pain syndrome -     Discontinue: HYDROcodone-acetaminophen (NORCO/VICODIN) 5-325 MG tablet; Take 2 tablets by mouth every 6 (six) hours as needed for moderate pain. -     Discontinue: HYDROcodone-acetaminophen (NORCO/VICODIN) 5-325 MG tablet; Take 2 tablets by mouth every 6 (six) hours as needed for moderate pain. -     HYDROcodone-acetaminophen (NORCO/VICODIN) 5-325 MG tablet; Take 2 tablets by mouth every 6 (six) hours as needed for moderate pain.  Pain of left shoulder joint on movement -     Discontinue: HYDROcodone-acetaminophen (NORCO/VICODIN) 5-325 MG tablet; Take 2 tablets by mouth every 6 (six) hours as needed for moderate pain. -     Discontinue: HYDROcodone-acetaminophen (NORCO/VICODIN) 5-325 MG tablet; Take 2 tablets by mouth every 6 (six) hours as needed for moderate pain. -     HYDROcodone-acetaminophen (NORCO/VICODIN) 5-325 MG tablet; Take 2 tablets by mouth every 6 (six) hours as needed for moderate pain.  Chronic midline back pain, unspecified back location -     Discontinue: HYDROcodone-acetaminophen (NORCO/VICODIN) 5-325 MG tablet; Take 2 tablets by mouth  every 6 (six) hours as needed for moderate pain. -     Discontinue: HYDROcodone-acetaminophen (NORCO/VICODIN) 5-325 MG tablet; Take 2 tablets by mouth every 6 (six) hours as needed for moderate pain. -     HYDROcodone-acetaminophen (NORCO/VICODIN) 5-325 MG tablet; Take 2 tablets by mouth every 6 (six) hours as needed for moderate pain.   I am having Ms. Medlen maintain her albuterol, traZODone, Naproxen Sodium (ALEVE PO), and HYDROcodone-acetaminophen.  Meds ordered this encounter  Medications  . DISCONTD: HYDROcodone-acetaminophen (NORCO/VICODIN) 5-325 MG tablet    Sig: Take 2 tablets by mouth every 6 (six) hours as needed for moderate pain.    Dispense:  90 tablet    Refill:  0    . DISCONTD: HYDROcodone-acetaminophen (NORCO/VICODIN) 5-325 MG tablet    Sig: Take 2 tablets by mouth every 6 (six) hours as needed for moderate pain.    Dispense:  90 tablet    Refill:  0  . HYDROcodone-acetaminophen (NORCO/VICODIN) 5-325 MG tablet    Sig: Take 2 tablets by mouth every 6 (six) hours as needed for moderate pain.    Dispense:  90 tablet    Refill:  0     Follow-up: Return in about 4 months (around 04/23/2016).  Scarlette Calico, MD

## 2015-12-22 NOTE — Progress Notes (Signed)
Pre visit review using our clinic review tool, if applicable. No additional management support is needed unless otherwise documented below in the visit note. 

## 2015-12-22 NOTE — Patient Instructions (Signed)

## 2016-04-07 ENCOUNTER — Ambulatory Visit (INDEPENDENT_AMBULATORY_CARE_PROVIDER_SITE_OTHER): Payer: BLUE CROSS/BLUE SHIELD | Admitting: Internal Medicine

## 2016-04-07 ENCOUNTER — Other Ambulatory Visit (INDEPENDENT_AMBULATORY_CARE_PROVIDER_SITE_OTHER): Payer: BLUE CROSS/BLUE SHIELD

## 2016-04-07 ENCOUNTER — Encounter: Payer: Self-pay | Admitting: Internal Medicine

## 2016-04-07 VITALS — BP 140/76 | HR 96 | Temp 98.2°F | Resp 16 | Ht 66.0 in | Wt 256.2 lb

## 2016-04-07 DIAGNOSIS — R002 Palpitations: Secondary | ICD-10-CM | POA: Diagnosis not present

## 2016-04-07 DIAGNOSIS — M549 Dorsalgia, unspecified: Secondary | ICD-10-CM

## 2016-04-07 DIAGNOSIS — M5440 Lumbago with sciatica, unspecified side: Secondary | ICD-10-CM | POA: Diagnosis not present

## 2016-04-07 DIAGNOSIS — M160 Bilateral primary osteoarthritis of hip: Secondary | ICD-10-CM

## 2016-04-07 DIAGNOSIS — I10 Essential (primary) hypertension: Secondary | ICD-10-CM

## 2016-04-07 DIAGNOSIS — G8929 Other chronic pain: Secondary | ICD-10-CM

## 2016-04-07 DIAGNOSIS — G894 Chronic pain syndrome: Secondary | ICD-10-CM | POA: Diagnosis not present

## 2016-04-07 DIAGNOSIS — M25512 Pain in left shoulder: Secondary | ICD-10-CM | POA: Diagnosis not present

## 2016-04-07 DIAGNOSIS — M170 Bilateral primary osteoarthritis of knee: Secondary | ICD-10-CM

## 2016-04-07 DIAGNOSIS — R0683 Snoring: Secondary | ICD-10-CM

## 2016-04-07 LAB — CBC WITH DIFFERENTIAL/PLATELET
BASOS PCT: 0.6 % (ref 0.0–3.0)
Basophils Absolute: 0 10*3/uL (ref 0.0–0.1)
EOS ABS: 0.2 10*3/uL (ref 0.0–0.7)
Eosinophils Relative: 3.5 % (ref 0.0–5.0)
HEMATOCRIT: 37.2 % (ref 36.0–46.0)
Hemoglobin: 12.6 g/dL (ref 12.0–15.0)
LYMPHS PCT: 51 % — AB (ref 12.0–46.0)
Lymphs Abs: 2.8 10*3/uL (ref 0.7–4.0)
MCHC: 33.8 g/dL (ref 30.0–36.0)
MCV: 90 fl (ref 78.0–100.0)
Monocytes Absolute: 0.5 10*3/uL (ref 0.1–1.0)
Monocytes Relative: 8.9 % (ref 3.0–12.0)
NEUTROS ABS: 2 10*3/uL (ref 1.4–7.7)
Neutrophils Relative %: 36 % — ABNORMAL LOW (ref 43.0–77.0)
PLATELETS: 231 10*3/uL (ref 150.0–400.0)
RBC: 4.13 Mil/uL (ref 3.87–5.11)
RDW: 13.6 % (ref 11.5–15.5)
WBC: 5.4 10*3/uL (ref 4.0–10.5)

## 2016-04-07 LAB — COMPREHENSIVE METABOLIC PANEL
ALT: 16 U/L (ref 0–35)
AST: 18 U/L (ref 0–37)
Albumin: 3.8 g/dL (ref 3.5–5.2)
Alkaline Phosphatase: 96 U/L (ref 39–117)
BUN: 10 mg/dL (ref 6–23)
CALCIUM: 9.7 mg/dL (ref 8.4–10.5)
CHLORIDE: 106 meq/L (ref 96–112)
CO2: 25 meq/L (ref 19–32)
CREATININE: 0.48 mg/dL (ref 0.40–1.20)
GFR: 175.45 mL/min (ref >60.00)
Glucose, Bld: 89 mg/dL (ref 70–99)
Potassium: 3.8 mEq/L (ref 3.5–5.1)
SODIUM: 138 meq/L (ref 135–145)
Total Bilirubin: 0.6 mg/dL (ref 0.2–1.2)
Total Protein: 7 g/dL (ref 6.0–8.3)

## 2016-04-07 LAB — URINALYSIS, ROUTINE W REFLEX MICROSCOPIC
Bilirubin Urine: NEGATIVE
HGB URINE DIPSTICK: NEGATIVE
Ketones, ur: NEGATIVE
Leukocytes, UA: NEGATIVE
NITRITE: NEGATIVE
Specific Gravity, Urine: 1.025 (ref 1.000–1.030)
TOTAL PROTEIN, URINE-UPE24: NEGATIVE
URINE GLUCOSE: NEGATIVE
UROBILINOGEN UA: 0.2 (ref 0.0–1.0)
pH: 6 (ref 5.0–8.0)

## 2016-04-07 LAB — TSH: TSH: 0.03 u[IU]/mL — ABNORMAL LOW (ref 0.35–4.50)

## 2016-04-07 MED ORDER — HYDROCODONE-ACETAMINOPHEN 5-325 MG PO TABS: 2.0000 | ORAL_TABLET | Freq: Four times a day (QID) | ORAL | 0 refills | Status: DC | PRN

## 2016-04-07 NOTE — Patient Instructions (Signed)
Hypertension Hypertension, commonly called high blood pressure, is when the force of blood pumping through your arteries is too strong. Your arteries are the blood vessels that carry blood from your heart throughout your body. A blood pressure reading consists of a higher number over a lower number, such as 110/72. The higher number (systolic) is the pressure inside your arteries when your heart pumps. The lower number (diastolic) is the pressure inside your arteries when your heart relaxes. Ideally you want your blood pressure below 120/80. Hypertension forces your heart to work harder to pump blood. Your arteries may become narrow or stiff. Having untreated or uncontrolled hypertension can cause heart attack, stroke, kidney disease, and other problems. What increases the risk? Some risk factors for high blood pressure are controllable. Others are not. Risk factors you cannot control include:  Race. You may be at higher risk if you are African American.  Age. Risk increases with age.  Gender. Men are at higher risk than women before age 45 years. After age 65, women are at higher risk than men. Risk factors you can control include:  Not getting enough exercise or physical activity.  Being overweight.  Getting too much fat, sugar, calories, or salt in your diet.  Drinking too much alcohol. What are the signs or symptoms? Hypertension does not usually cause signs or symptoms. Extremely high blood pressure (hypertensive crisis) may cause headache, anxiety, shortness of breath, and nosebleed. How is this diagnosed? To check if you have hypertension, your health care provider will measure your blood pressure while you are seated, with your arm held at the level of your heart. It should be measured at least twice using the same arm. Certain conditions can cause a difference in blood pressure between your right and left arms. A blood pressure reading that is higher than normal on one occasion does  not mean that you need treatment. If it is not clear whether you have high blood pressure, you may be asked to return on a different day to have your blood pressure checked again. Or, you may be asked to monitor your blood pressure at home for 1 or more weeks. How is this treated? Treating high blood pressure includes making lifestyle changes and possibly taking medicine. Living a healthy lifestyle can help lower high blood pressure. You may need to change some of your habits. Lifestyle changes may include:  Following the DASH diet. This diet is high in fruits, vegetables, and whole grains. It is low in salt, red meat, and added sugars.  Keep your sodium intake below 2,300 mg per day.  Getting at least 30-45 minutes of aerobic exercise at least 4 times per week.  Losing weight if necessary.  Not smoking.  Limiting alcoholic beverages.  Learning ways to reduce stress. Your health care provider may prescribe medicine if lifestyle changes are not enough to get your blood pressure under control, and if one of the following is true:  You are 18-59 years of age and your systolic blood pressure is above 140.  You are 60 years of age or older, and your systolic blood pressure is above 150.  Your diastolic blood pressure is above 90.  You have diabetes, and your systolic blood pressure is over 140 or your diastolic blood pressure is over 90.  You have kidney disease and your blood pressure is above 140/90.  You have heart disease and your blood pressure is above 140/90. Your personal target blood pressure may vary depending on your medical   conditions, your age, and other factors. Follow these instructions at home:  Have your blood pressure rechecked as directed by your health care provider.  Take medicines only as directed by your health care provider. Follow the directions carefully. Blood pressure medicines must be taken as prescribed. The medicine does not work as well when you skip  doses. Skipping doses also puts you at risk for problems.  Do not smoke.  Monitor your blood pressure at home as directed by your health care provider. Contact a health care provider if:  You think you are having a reaction to medicines taken.  You have recurrent headaches or feel dizzy.  You have swelling in your ankles.  You have trouble with your vision. Get help right away if:  You develop a severe headache or confusion.  You have unusual weakness, numbness, or feel faint.  You have severe chest or abdominal pain.  You vomit repeatedly.  You have trouble breathing. This information is not intended to replace advice given to you by your health care provider. Make sure you discuss any questions you have with your health care provider. Document Released: 02/15/2005 Document Revised: 07/24/2015 Document Reviewed: 12/08/2012 Elsevier Interactive Patient Education  2017 [employer]  

## 2016-04-07 NOTE — Progress Notes (Signed)
Subjective:  Patient ID: Denise Morgan, female    DOB: 04-Jul-1965  Age: 51 y.o. MRN: [id number]  CC: Hypertension; Osteoarthritis; and Back Pain   HPI Denise Morgan presents for a blood pressure check as well as follow-up on chronic pain in her lower back and joints. The back pain is an intermittent achy sensation that limits her activities and the knee pain interferes with her activities as well. She has tried to take Naprosyn for symptom relief without much success. Her husband complains about her snoring.  Outpatient Medications Prior to Visit  Medication Sig Dispense Refill  . albuterol (PROVENTIL HFA;VENTOLIN HFA) 108 (90 Base) MCG/ACT inhaler Inhale 2 puffs into the lungs every 4 (four) hours as needed for wheezing. 1 Inhaler 11  . traZODone (DESYREL) 100 MG tablet Take 1 tablet (100 mg total) by mouth at bedtime. 90 tablet 3  . HYDROcodone-acetaminophen (NORCO/VICODIN) 5-325 MG tablet Take 2 tablets by mouth every 6 (six) hours as needed for moderate pain. 90 tablet 0  . Naproxen Sodium (ALEVE PO) Take 2 capsules by mouth 2 (two) times daily before a meal.     No facility-administered medications prior to visit.     ROS Review of Systems  Constitutional: Negative for appetite change, diaphoresis, fatigue and unexpected weight change.  HENT: Negative.   Eyes: Negative for visual disturbance.  Respiratory: Negative for cough, chest tightness, shortness of breath, wheezing and stridor.   Cardiovascular: Positive for palpitations and leg swelling. Negative for chest pain.       She complains of intermittent episodes of swelling in her hands and feet. She has chronic palpitations for several years now. She had a 48-hour Holter monitor done about a year ago that was unremarkable. She describes the palpitations as saying that occasionally she feels her heart rate go up.  Gastrointestinal: Negative for abdominal pain, blood in stool, constipation, diarrhea, nausea, rectal pain and  vomiting.  Endocrine: Negative.  Negative for cold intolerance and heat intolerance.  Genitourinary: Negative.  Negative for decreased urine volume, difficulty urinating, dysuria, enuresis and urgency.  Musculoskeletal: Positive for arthralgias and back pain. Negative for joint swelling, myalgias and neck pain.  Skin: Negative.   Allergic/Immunologic: Negative.   Neurological: Negative.  Negative for dizziness and headaches.  Hematological: Negative.  Negative for adenopathy. Does not bruise/bleed easily.  Psychiatric/Behavioral: Negative.     Objective:  BP 140/76 (BP Location: Left Arm, Patient Position: Sitting, Cuff Size: Large)   Pulse 96   Temp 98.2 F (36.8 C) (Oral)   Resp 16   Ht 5\' 6"  (1.676 m)   Wt 256 lb 4 oz (116.2 kg)   LMP 03/01/2004   SpO2 96%   BMI 41.36 kg/m   BP Readings from Last 3 Encounters:  04/07/16 140/76  12/22/15 134/80  08/15/15 126/78    Wt Readings from Last 3 Encounters:  04/07/16 256 lb 4 oz (116.2 kg)  12/22/15 283 lb 4 oz (128.5 kg)  08/15/15 270 lb (122.5 kg)    Physical Exam  Constitutional: She is oriented to person, place, and time. No distress.  HENT:  Mouth/Throat: Oropharynx is clear and moist. No oropharyngeal exudate.  Eyes: Conjunctivae are normal. Right eye exhibits no discharge. Left eye exhibits no discharge. No scleral icterus.  Neck: Normal range of motion. Neck supple. No JVD present. No tracheal deviation present. No thyromegaly present.  Cardiovascular: Normal rate, regular rhythm, normal heart sounds and intact distal pulses.  Exam reveals no gallop and no friction rub.  No murmur heard. EKG--  Sinus  Rhythm  WITHIN NORMAL LIMITS  Pulmonary/Chest: Effort normal and breath sounds normal. No stridor. No respiratory distress. She has no wheezes. She has no rales. She exhibits no tenderness.  Abdominal: Soft. Bowel sounds are normal. She exhibits no distension and no mass. There is no tenderness. There is no rebound  and no guarding.  Musculoskeletal: Normal range of motion. She exhibits edema (trace pitting edema in BLE). She exhibits no tenderness or deformity.  Lymphadenopathy:    She has no cervical adenopathy.  Neurological: She is oriented to person, place, and time.  Skin: Skin is warm and dry. No rash noted. She is not diaphoretic. No erythema. No pallor.  Psychiatric: She has a normal mood and affect. Her behavior is normal. Judgment and thought content normal.  Vitals reviewed.   Lab Results  Component Value Date   WBC 5.4 04/07/2016   HGB 12.6 04/07/2016   HCT 37.2 04/07/2016   PLT 231.0 04/07/2016   GLUCOSE 89 04/07/2016   CHOL 252 (H) 05/29/2015   TRIG 135.0 05/29/2015   HDL 51.80 05/29/2015   LDLDIRECT 155.6 02/16/2013   LDLCALC 173 (H) 05/29/2015   ALT 16 04/07/2016   AST 18 04/07/2016   NA 138 04/07/2016   K 3.8 04/07/2016   CL 106 04/07/2016   CREATININE 0.48 04/07/2016   BUN 10 04/07/2016   CO2 25 04/07/2016   TSH 0.03 (L) 04/07/2016   INR 3.2 11/24/2012    Mm Digital Screening Bilateral  Result Date: 06/30/2015 CLINICAL DATA:  Screening. EXAM: DIGITAL SCREENING BILATERAL MAMMOGRAM WITH CAD COMPARISON:  Previous exam(s). ACR Breast Density Category b: There are scattered areas of fibroglandular density. FINDINGS: There are no findings suspicious for malignancy. Images were processed with CAD. IMPRESSION: No mammographic evidence of malignancy. A result letter of this screening mammogram will be mailed directly to the patient. RECOMMENDATION: Screening mammogram in one year. (Code:SM-B-01Y) BI-RADS CATEGORY  1: Negative. Electronically Signed   By: Lovey Newcomer M.D.   On: 07/01/2015 17:47    Assessment & Plan:   Saranda was seen today for hypertension, osteoarthritis and back pain.  Diagnoses and all orders for this visit:  Essential hypertension- She has a borderline elevation in her blood pressure and does not want to start taking an antihypertensive. Her labs are  negative for any secondary causes or end organ damage, will refer for sleep study to see if she is developing sleep apnea, she will work on her lifestyle modifications and we'll recheck her blood pressure about 3 months. -     Comprehensive metabolic panel; Future -     CBC with Differential/Platelet; Future -     TSH; Future -     Urinalysis, Routine w reflex microscopic; Future -     EKG 12-Lead  Palpitations- her EKG remains normal and her palpitations are consistent with benign etiology -     TSH; Future -     EKG 12-Lead  Primary osteoarthritis of both hips -     Discontinue: HYDROcodone-acetaminophen (NORCO/VICODIN) 5-325 MG tablet; Take 2 tablets by mouth every 6 (six) hours as needed for moderate pain. -     Discontinue: HYDROcodone-acetaminophen (NORCO/VICODIN) 5-325 MG tablet; Take 2 tablets by mouth every 6 (six) hours as needed for moderate pain. -     Discontinue: HYDROcodone-acetaminophen (NORCO/VICODIN) 5-325 MG tablet; Take 2 tablets by mouth every 6 (six) hours as needed for moderate pain. -     HYDROcodone-acetaminophen (NORCO/VICODIN) 5-325 MG  tablet; Take 2 tablets by mouth every 6 (six) hours as needed for moderate pain.  Primary osteoarthritis of both knees -     Discontinue: HYDROcodone-acetaminophen (NORCO/VICODIN) 5-325 MG tablet; Take 2 tablets by mouth every 6 (six) hours as needed for moderate pain. -     Discontinue: HYDROcodone-acetaminophen (NORCO/VICODIN) 5-325 MG tablet; Take 2 tablets by mouth every 6 (six) hours as needed for moderate pain. -     Discontinue: HYDROcodone-acetaminophen (NORCO/VICODIN) 5-325 MG tablet; Take 2 tablets by mouth every 6 (six) hours as needed for moderate pain. -     HYDROcodone-acetaminophen (NORCO/VICODIN) 5-325 MG tablet; Take 2 tablets by mouth every 6 (six) hours as needed for moderate pain.  Chronic low back pain with sciatica, sciatica laterality unspecified, unspecified back pain laterality -     Discontinue:  HYDROcodone-acetaminophen (NORCO/VICODIN) 5-325 MG tablet; Take 2 tablets by mouth every 6 (six) hours as needed for moderate pain. -     Discontinue: HYDROcodone-acetaminophen (NORCO/VICODIN) 5-325 MG tablet; Take 2 tablets by mouth every 6 (six) hours as needed for moderate pain. -     Discontinue: HYDROcodone-acetaminophen (NORCO/VICODIN) 5-325 MG tablet; Take 2 tablets by mouth every 6 (six) hours as needed for moderate pain. -     HYDROcodone-acetaminophen (NORCO/VICODIN) 5-325 MG tablet; Take 2 tablets by mouth every 6 (six) hours as needed for moderate pain.  Chronic pain syndrome -     Discontinue: HYDROcodone-acetaminophen (NORCO/VICODIN) 5-325 MG tablet; Take 2 tablets by mouth every 6 (six) hours as needed for moderate pain. -     Discontinue: HYDROcodone-acetaminophen (NORCO/VICODIN) 5-325 MG tablet; Take 2 tablets by mouth every 6 (six) hours as needed for moderate pain. -     Discontinue: HYDROcodone-acetaminophen (NORCO/VICODIN) 5-325 MG tablet; Take 2 tablets by mouth every 6 (six) hours as needed for moderate pain. -     HYDROcodone-acetaminophen (NORCO/VICODIN) 5-325 MG tablet; Take 2 tablets by mouth every 6 (six) hours as needed for moderate pain.  Pain of left shoulder joint on movement  Chronic midline back pain, unspecified back location  Snoring -     Ambulatory referral to Sleep Studies   I have discontinued Ms. Krienke's Naproxen Sodium (ALEVE PO). I am also having her maintain her albuterol, traZODone, and HYDROcodone-acetaminophen.  Meds ordered this encounter  Medications  . DISCONTD: HYDROcodone-acetaminophen (NORCO/VICODIN) 5-325 MG tablet    Sig: Take 2 tablets by mouth every 6 (six) hours as needed for moderate pain.    Dispense:  90 tablet    Refill:  0  . DISCONTD: HYDROcodone-acetaminophen (NORCO/VICODIN) 5-325 MG tablet    Sig: Take 2 tablets by mouth every 6 (six) hours as needed for moderate pain.    Dispense:  90 tablet    Refill:  0  .  DISCONTD: HYDROcodone-acetaminophen (NORCO/VICODIN) 5-325 MG tablet    Sig: Take 2 tablets by mouth every 6 (six) hours as needed for moderate pain.    Dispense:  90 tablet    Refill:  0  . HYDROcodone-acetaminophen (NORCO/VICODIN) 5-325 MG tablet    Sig: Take 2 tablets by mouth every 6 (six) hours as needed for moderate pain.    Dispense:  90 tablet    Refill:  0     Follow-up: Return in about 3 months (around 07/05/2016).  Scarlette Calico, MD

## 2016-04-07 NOTE — Progress Notes (Signed)
Pre visit review using our clinic review tool, if applicable. No additional management support is needed unless otherwise documented below in the visit note. 

## 2016-04-21 ENCOUNTER — Ambulatory Visit (INDEPENDENT_AMBULATORY_CARE_PROVIDER_SITE_OTHER): Payer: BLUE CROSS/BLUE SHIELD | Admitting: Internal Medicine

## 2016-04-21 ENCOUNTER — Other Ambulatory Visit (INDEPENDENT_AMBULATORY_CARE_PROVIDER_SITE_OTHER): Payer: BLUE CROSS/BLUE SHIELD

## 2016-04-21 ENCOUNTER — Encounter: Payer: Self-pay | Admitting: Internal Medicine

## 2016-04-21 VITALS — BP 144/80 | HR 99 | Temp 99.2°F | Resp 16 | Ht 66.0 in | Wt 250.5 lb

## 2016-04-21 DIAGNOSIS — E785 Hyperlipidemia, unspecified: Secondary | ICD-10-CM | POA: Diagnosis not present

## 2016-04-21 DIAGNOSIS — J101 Influenza due to other identified influenza virus with other respiratory manifestations: Secondary | ICD-10-CM

## 2016-04-21 DIAGNOSIS — E0590 Thyrotoxicosis, unspecified without thyrotoxic crisis or storm: Secondary | ICD-10-CM | POA: Insufficient documentation

## 2016-04-21 DIAGNOSIS — R946 Abnormal results of thyroid function studies: Secondary | ICD-10-CM

## 2016-04-21 DIAGNOSIS — R6889 Other general symptoms and signs: Secondary | ICD-10-CM

## 2016-04-21 DIAGNOSIS — R7989 Other specified abnormal findings of blood chemistry: Secondary | ICD-10-CM

## 2016-04-21 LAB — CBC WITH DIFFERENTIAL/PLATELET
BASOS ABS: 0 10*3/uL (ref 0.0–0.1)
BASOS PCT: 0.5 % (ref 0.0–3.0)
EOS ABS: 0.1 10*3/uL (ref 0.0–0.7)
Eosinophils Relative: 2.7 % (ref 0.0–5.0)
HEMATOCRIT: 35.9 % — AB (ref 36.0–46.0)
HEMOGLOBIN: 12.1 g/dL (ref 12.0–15.0)
LYMPHS PCT: 36.6 % (ref 12.0–46.0)
Lymphs Abs: 1.6 10*3/uL (ref 0.7–4.0)
MCHC: 33.8 g/dL (ref 30.0–36.0)
MCV: 90.3 fl (ref 78.0–100.0)
MONOS PCT: 14.6 % — AB (ref 3.0–12.0)
Monocytes Absolute: 0.6 10*3/uL (ref 0.1–1.0)
NEUTROS ABS: 2 10*3/uL (ref 1.4–7.7)
Neutrophils Relative %: 45.6 % (ref 43.0–77.0)
PLATELETS: 230 10*3/uL (ref 150.0–400.0)
RBC: 3.98 Mil/uL (ref 3.87–5.11)
RDW: 13.9 % (ref 11.5–15.5)
WBC: 4.3 10*3/uL (ref 4.0–10.5)

## 2016-04-21 LAB — LIPID PANEL
CHOL/HDL RATIO: 4
CHOLESTEROL: 178 mg/dL (ref 0–200)
HDL: 40.8 mg/dL (ref >39.00)
LDL Cholesterol: 100 mg/dL — ABNORMAL HIGH (ref 0–99)
NonHDL: 137.39
TRIGLYCERIDES: 187 mg/dL — AB (ref 0.0–149.0)
VLDL: 37.4 mg/dL (ref 0.0–40.0)

## 2016-04-21 LAB — POCT INFLUENZA A/B
Influenza A, POC: POSITIVE — AB
Influenza B, POC: NEGATIVE

## 2016-04-21 MED ORDER — OSELTAMIVIR PHOSPHATE 75 MG PO CAPS: 75.0000 mg | ORAL_CAPSULE | Freq: Two times a day (BID) | ORAL | 0 refills | Status: AC

## 2016-04-21 MED ORDER — PROMETHAZINE-DM 6.25-15 MG/5ML PO SYRP: 5.0000 mL | ORAL_SOLUTION | Freq: Four times a day (QID) | ORAL | 0 refills | Status: DC | PRN

## 2016-04-21 NOTE — Progress Notes (Signed)
Pre visit review using our clinic review tool, if applicable. No additional management support is needed unless otherwise documented below in the visit note. 

## 2016-04-21 NOTE — Progress Notes (Signed)
Subjective:  Patient ID: Denise Morgan, female    DOB: 1965/07/02  Age: 51 y.o. MRN: [id number]  CC: Cough   HPI Denise Morgan presents for A 3 day history of nonproductive cough, sneezing, low-grade fever, and sore throat.  She is also due for repeat thyroid function studies. She was recently found to have a suppressed TSH. She denies palpitations, weight changes, diarrhea, constipation, or edema.  Outpatient Medications Prior to Visit  Medication Sig Dispense Refill  . albuterol (PROVENTIL HFA;VENTOLIN HFA) 108 (90 Base) MCG/ACT inhaler Inhale 2 puffs into the lungs every 4 (four) hours as needed for wheezing. 1 Inhaler 11  . HYDROcodone-acetaminophen (NORCO/VICODIN) 5-325 MG tablet Take 2 tablets by mouth every 6 (six) hours as needed for moderate pain. 90 tablet 0  . traZODone (DESYREL) 100 MG tablet Take 1 tablet (100 mg total) by mouth at bedtime. 90 tablet 3   No facility-administered medications prior to visit.     ROS Review of Systems  Constitutional: Positive for fever. Negative for appetite change, chills, diaphoresis and fatigue.  HENT: Positive for congestion and sore throat. Negative for trouble swallowing.   Eyes: Negative for visual disturbance.  Respiratory: Positive for cough. Negative for chest tightness, shortness of breath and wheezing.   Cardiovascular: Negative for chest pain, palpitations and leg swelling.  Gastrointestinal: Negative for abdominal pain, constipation, diarrhea, nausea and vomiting.  Endocrine: Negative.  Negative for cold intolerance and heat intolerance.  Genitourinary: Negative.   Musculoskeletal: Negative.  Negative for back pain and neck pain.  Skin: Negative.   Allergic/Immunologic: Negative.   Neurological: Negative.  Negative for dizziness, weakness and light-headedness.  Hematological: Negative.  Negative for adenopathy. Does not bruise/bleed easily.  Psychiatric/Behavioral: Negative.  Negative for dysphoric mood and sleep  disturbance. The patient is not nervous/anxious.     Objective:  BP (!) 144/80 (BP Location: Left Arm, Patient Position: Sitting, Cuff Size: Large)   Pulse 99   Temp 99.2 F (37.3 C) (Oral)   Ht 5\' 6"  (1.676 m)   Wt 250 lb 8 oz (113.6 kg)   LMP 03/01/2004   SpO2 98%   BMI 40.43 kg/m   BP Readings from Last 3 Encounters:  04/21/16 (!) 144/80  04/07/16 140/76  12/22/15 134/80    Wt Readings from Last 3 Encounters:  04/21/16 250 lb 8 oz (113.6 kg)  04/07/16 256 lb 4 oz (116.2 kg)  12/22/15 283 lb 4 oz (128.5 kg)    Physical Exam  Constitutional: She is oriented to person, place, and time.  Non-toxic appearance. She does not have a sickly appearance. She does not appear ill. No distress.  HENT:  Mouth/Throat: No oropharyngeal exudate.  Eyes: Conjunctivae are normal. Right eye exhibits no discharge. Left eye exhibits no discharge. No scleral icterus.  Neck: Normal range of motion. Neck supple. No JVD present. No tracheal deviation present. No thyromegaly present.  Cardiovascular: Normal rate, regular rhythm, normal heart sounds and intact distal pulses.  Exam reveals no gallop and no friction rub.   No murmur heard. Pulmonary/Chest: Effort normal and breath sounds normal. No stridor. No respiratory distress. She has no wheezes. She has no rales. She exhibits no tenderness.  Abdominal: Soft. Bowel sounds are normal. She exhibits no distension and no mass. There is no tenderness. There is no rebound and no guarding.  Musculoskeletal: Normal range of motion. She exhibits no edema, tenderness or deformity.  Lymphadenopathy:    She has no cervical adenopathy.  Neurological: She is oriented to  person, place, and time.  Skin: Skin is warm and dry. No rash noted. She is not diaphoretic. No erythema. No pallor.  Vitals reviewed.   Lab Results  Component Value Date   WBC 4.3 04/21/2016   HGB 12.1 04/21/2016   HCT 35.9 (L) 04/21/2016   PLT 230.0 04/21/2016   GLUCOSE 89 04/07/2016     CHOL 178 04/21/2016   TRIG 187.0 (H) 04/21/2016   HDL 40.80 04/21/2016   LDLDIRECT 155.6 02/16/2013   LDLCALC 100 (H) 04/21/2016   ALT 16 04/07/2016   AST 18 04/07/2016   NA 138 04/07/2016   K 3.8 04/07/2016   CL 106 04/07/2016   CREATININE 0.48 04/07/2016   BUN 10 04/07/2016   CO2 25 04/07/2016   TSH <0.01 (L) 04/21/2016   INR 3.2 11/24/2012    Mm Digital Screening Bilateral  Result Date: 06/30/2015 CLINICAL DATA:  Screening. EXAM: DIGITAL SCREENING BILATERAL MAMMOGRAM WITH CAD COMPARISON:  Previous exam(s). ACR Breast Density Category b: There are scattered areas of fibroglandular density. FINDINGS: There are no findings suspicious for malignancy. Images were processed with CAD. IMPRESSION: No mammographic evidence of malignancy. A result letter of this screening mammogram will be mailed directly to the patient. RECOMMENDATION: Screening mammogram in one year. (Code:SM-B-01Y) BI-RADS CATEGORY  1: Negative. Electronically Signed   By: Lovey Newcomer M.D.   On: 07/01/2015 17:47    Assessment & Plan:   Denise Morgan was seen today for cough.  Diagnoses and all orders for this visit:  Flu-like symptoms -     POCT Influenza A/B -     CBC with Differential/Platelet; Future -     promethazine-dextromethorphan (PROMETHAZINE-DM) 6.25-15 MG/5ML syrup; Take 5 mLs by mouth 4 (four) times daily as needed for cough.  Low TSH level- she has mild hyperthyroidism, I've asked her to see endocrinology for further evaluation and to consider treatment options. -     Lipid panel; Future -     CBC with Differential/Platelet; Future -     Thyroid peroxidase antibody; Future -     Ambulatory referral to Endocrinology  Hyperlipidemia with target LDL less than 130- her Framingham risk score is about 1-2% , I therefore do not recommend that she start taking a statin for cardiovascular risk reduction. -     Lipid panel; Future -     Thyroid Panel With TSH; Future  Influenza A -     oseltamivir (TAMIFLU) 75  MG capsule; Take 1 capsule (75 mg total) by mouth 2 (two) times daily.   I am having Denise Morgan start on promethazine-dextromethorphan and oseltamivir. I am also having her maintain her albuterol, traZODone, and HYDROcodone-acetaminophen.  Meds ordered this encounter  Medications  . promethazine-dextromethorphan (PROMETHAZINE-DM) 6.25-15 MG/5ML syrup    Sig: Take 5 mLs by mouth 4 (four) times daily as needed for cough.    Dispense:  118 mL    Refill:  0  . oseltamivir (TAMIFLU) 75 MG capsule    Sig: Take 1 capsule (75 mg total) by mouth 2 (two) times daily.    Dispense:  10 capsule    Refill:  0     Follow-up: Return if symptoms worsen or fail to improve.  Scarlette Calico, MD

## 2016-04-21 NOTE — Patient Instructions (Signed)

## 2016-04-22 ENCOUNTER — Encounter: Payer: Self-pay | Admitting: Internal Medicine

## 2016-04-22 LAB — THYROID PANEL WITH TSH
Free Thyroxine Index: 6.6 — ABNORMAL HIGH (ref 1.4–3.8)
T3 Uptake: 39 % — ABNORMAL HIGH (ref 22–35)
T4 TOTAL: 17 ug/dL — AB (ref 4.5–12.0)
TSH: <0.01 mIU/L — ABNORMAL LOW

## 2016-04-22 LAB — THYROID PEROXIDASE ANTIBODY: THYROID PEROXIDASE ANTIBODY: 4 [IU]/mL (ref <9)

## 2016-06-04 ENCOUNTER — Ambulatory Visit: Payer: BLUE CROSS/BLUE SHIELD | Admitting: Endocrinology

## 2016-06-30 ENCOUNTER — Encounter: Payer: Self-pay | Admitting: Endocrinology

## 2016-06-30 ENCOUNTER — Ambulatory Visit (INDEPENDENT_AMBULATORY_CARE_PROVIDER_SITE_OTHER): Payer: BLUE CROSS/BLUE SHIELD | Admitting: Endocrinology

## 2016-06-30 VITALS — BP 130/76 | HR 82 | Ht 66.0 in | Wt 253.4 lb

## 2016-06-30 DIAGNOSIS — E0590 Thyrotoxicosis, unspecified without thyrotoxic crisis or storm: Secondary | ICD-10-CM

## 2016-06-30 LAB — T3, FREE: T3, Free: 6.5 pg/mL — ABNORMAL HIGH (ref 2.3–4.2)

## 2016-06-30 LAB — TSH: TSH: <0.01 u[IU]/mL — ABNORMAL LOW (ref 0.35–4.50)

## 2016-06-30 LAB — T4, FREE: FREE T4: 1.84 ng/dL — AB (ref 0.60–1.60)

## 2016-06-30 NOTE — Patient Instructions (Signed)
What is hyperthyroidism?  Hyperthyroidism develops when the body is exposed to excessive amounts of thyroid hormone. This disorder occurs in almost one percent of all Americans and affects women five to 10 times more often than men. In its mildest form, hyperthyroidism may not cause recognizable symptoms. More often, however, the symptoms are discomforting, disabling or even life-threatening.  What are the causes of hyperthyroidism?  Berenice Primas' disease: Graves' disease (named after [place] physician Raylene Everts) is an autoimmune disorder that frequently results in thyroid enlargement and hyperthyroidism. In some patients, swelling of the muscles and other tissues around the eyes may develop, causing eye prominence, discomfort or double vision. Like other autoimmune diseases, this condition tends to affect multiple family members. It is much more common in women than in men and tends to occur in younger patients. . Postpartum thyroiditis: Five to 10 percent of women develop mild to moderate hyperthyroidism within several months of giving birth. Hyperthyroidism in this condition usually lasts for approximately one to two months. It is often followed by several months of hypothyroidism, but most women will eventually recover normal thyroid function. In some cases, however, the thyroid gland does not heal, so the hypothyroidism becomes permanent and requires lifelong thyroid hormone replacement. This condition may occur again with subsequent pregnancies. . Silent thyroiditis: Transient (temporary) hyperthyroidism can be caused by silent thyroiditis, a condition which appears to be the same as postpartum thyroiditis, but is not related to pregnancy. It is not accompanied by a painful thyroid gland. . Subacute thyroiditis: This condition may follow a viral infection and is characterized by painful thyroid gland enlargement and inflammation, which results in the release of large amounts of thyroid hormones into the  blood. Fortunately, this condition usually resolves spontaneously. The thyroid usually heals itself over several months, but often not before a temporary period of low thyroid hormone production (hypothyroidism) occurs. . Toxic multinodular goiter: Multiple nodules in the thyroid can produce excessive thyroid hormone, causing hyperthyroidism. Typically diagnosed in patients over the age of 60, this disorder is more likely to affect heart rhythm. In many cases, the person has had the goiter for many years before it becomes overactive. . Toxic nodule: A single nodule or lump in the thyroid can also produce more thyroid hormone than the body requires and lead to hyperthyroidism. This disorder is not familial. . Excessive iodine ingestion: Various sources of high iodine concentrations, such as kelp tablets, some expectorants, amiodarone (Cordarone, Pacerone - medications used to treat certain problems with heart rhythms) and x-ray dyes may occasionally cause hyperthyroidism in patients who are prone to it. . Overmedication with thyroid hormone: Patients who receive excessive thyroxine replacement treatment can develop hyperthyroidism. They should have their thyroid hormone dosage evaluated by a physician at least once each year and should NEVER give themselves "extra" doses.   What are the signs and symptoms of hyperthyroidism? When hyperthyroidism develops, a goiter (enlargement of the thyroid) is usually present and may be associated with some or many of the following features: . Fast heart rate, often more than 100 beats per minute . Becoming anxious, irritable, argumentative . Trembling hands . Weight loss, despite eating the same amount or even more than usual . Intolerance of warm temperatures and increased likelihood to perspire . Loss of scalp hair . Tendency of fingernails to separate from the nail bed . Muscle weakness, especially of the upper arms and thighs . Loose and frequent bowel  movements . Smooth skin . Change in menstrual pattern . Increased  likelihood for miscarriage . Prominent "stare" of the eyes . Protrusion of the eyes, with or without double vision (in patients with Graves' disease) . Irregular heart rhythm, especially in patients older than 51 years of age . Accelerated loss of calcium from bones, which increases the risk of osteoporosis and fractures   How is hyperthyroidism diagnosed? Sometimes a general physician can diagnose and treat the cause of hyperthyroidism, but assistance is often needed from an endocrinologist, a physician who specializes in managing thyroid disease. Characteristic symptoms and physical signs of the disease can be detected by a trained physician. In addition, tests can be used to confirm the diagnosis and to determine the cause.  Tests TSH (THYROID-STIMULATING HORMONE OR THYROTROPIN): A low TSH level in the blood is the most accurate indicator of hyperthyroidism. The body shuts off production of this pituitary hormone when the thyroid gland even slightly overproduces thyroid hormone. If the TSH level is low, it is very important to also check thyroid hormone levels to confirm the diagnosis of hyperthyroidism.  ESTIMATES OF FREE THYROXINE AND FREE TRIIODOTHYRONINE: When hyperthyroidism develops, free thyroxine and free triiodothyronine levels rise above previous values in that specific patient (although they may still fall within the normal range for the general population) and are often considerably elevated. TSI (THYROID-STIMULATING IMMUNOGLOBULIN): A substance often found in the blood when Graves' disease is the cause of hyperthyroidism. RADIOACTIVE IODINE UPTAKE (RAIU): The amount of iodine the thyroid gland can collect, and a thyroid scan, which shows how the iodine is distributed throughout the thyroid gland.  THYROID SCAN: This information can be useful in determining the cause of hyperthyroidism and, ultimately, its  treatment.   How is hyperthyroidism treated? Appropriate management of hyperthyroidism requires careful evaluation and ongoing care by a physician experienced in the treatment of this complex condition. Before the development of current treatment options, the death rate from severe hyperthyroidism was as high as 50 percent. Now several effective treatments are available and, with proper management, death from hyperthyroidism is rare. Deciding which treatment is best depends on what caused the hyperthyroidism, its severity and other conditions present.   . Antithyroid Drugs In the [place], two drugs are available for treating hyperthyroidism: propylthiouracil (PTU) and methimazole (Tapazole). Except for early pregnancy, methimazole is preferred because PTU can cause fatal liver damage, although rarely. These medications control hyperthyroidism by slowing thyroid hormone production. They may take several months to normalize thyroid hormone levels. Some patients with hyperthyroidism caused by Graves' disease experience a spontaneous or natural remission of hyperthyroidism after a 12- to 85-month course of treatment with these drugs and may sometimes avoid permanent underactivity of the thyroid (hypothyroidism), which often occurs as a result of using the other methods of treating hyperthyroidism. Unfortunately, the remission is frequently only temporary, with the hyperthyroidism recurring after several months or years off medication and requiring additional treatment, so relatively few patients are treated solely with antithyroid medication in the [place]. Antithyroid drugs may cause an allergic reaction in about five percent of patients who use them. This usually occurs during the first six weeks of drug treatment. Such a reaction may include rash or hives; but after discontinuing use of the drug, the symptoms resolve within one to two weeks and there is no permanent damage. A more serious side  effect, but occurring in only about one in 250-500 patients during the first four to eight weeks of treatment, is a rapid decrease of white blood cells in the bloodstream. This could increase  susceptibility to serious infection. Symptoms such as a sore throat, infection or fever should be reported promptly to your physician, and a white blood cell count should be done immediately. In nearly every case, when a person stops using the medication, the white blood cell count returns to normal. Very rarely, antithyroid drugs may cause severe liver problems, which can be detected by monitoring blood tests or joint problems characterized by joint pain and/or swelling. Your physician should be contacted if there is yellowing of the skin (jaundice), fever, loss of appetite or abdominal pain.  . Radioactive Iodine Treatment Iodine is an essential ingredient in the production of thyroid hormone. Each molecule of thyroid hormone contains either four (T4) or three (T3) molecules of iodine. Since most overactive thyroid glands are quite hungry for iodine, it was discovered in the 1940s that the thyroid could be "tricked" into destroying itself by simply feeding it radioactive iodine. The radioactive iodine is given by mouth, usually in capsule form, and is quickly absorbed from the bowel. It then enters the thyroid cells from the bloodstream and gradually destroys them. Maximal benefit is usually noted within three to six months. It is not possible to eliminate "just the right amount" of the diseased thyroid gland, since radioiodine eventually damages all thyroid cells. Therefore, most endocrinologists strive to completely destroy the diseased thyroid gland with a single dose of radioiodine. This results in the intentional development of an underactive thyroid state (hypothyroidism), which is easily, predictably and inexpensively corrected by lifelong daily use of oral thyroid hormone replacement therapy. Although every effort  is made to calculate the correct dose of radioiodine for each patient, not every treatment will successfully correct the hyperthyroidism, particularly if the goiter is quite large and a second dose of radioactive iodine is occasionally needed. Thousands of patients have received radioiodine treatment, including former Software engineer of the [employer] and his wife, Pamala Hurry. The treatment is a very safe, simple and reliably effective one. Because of this, it is considered by most thyroid specialists in the [place] to be the treatment of choice for hyperthyroidism cases caused by overproduction of thyroid hormone. . . Other Treatments A drug from the class of beta-adrenergic blocking agents (which decrease the effects of excess thyroid hormone) may be used temporarily to control hyperthyroid symptoms until other therapies take effect. In cases where hyperthyroidism is caused by thyroiditis or excessive ingestion of either iodine or thyroid hormone, this may be the only type of treatment required. Iodine drops are prescribed when hyperthyroidism is severe or prior to undergoing surgery for Graves' disease.

## 2016-06-30 NOTE — Progress Notes (Signed)
Patient ID: Denise Morgan, female   DOB: 06/20/1965, 51 y.o.   MRN: [id number]                                                                                                               Reason for Appointment:  Hyperthyroidism, new consultation  Referring physician: Scarlette Calico   Chief complaint: Tiredness   History of Present Illness:   For the last several months the patient has been complaining about feeling tired. However last year she was having a rapid heartbeat and palpitations which went on for several weeks and these improved more recently She apparently had lost about 30 pounds last year starting in October although she was supposedly trying to lose weight  She also has had some persistent symptoms of feeling shaky at times She has had hot flashes and sweating for several years and this may be more prominent more recently She does not complain of increased bowel movements More recently her weight has leveled off  Wt Readings from Last 3 Encounters:  06/30/16 253 lb 6.4 oz (114.9 kg)  04/21/16 250 lb 8 oz (113.6 kg)  04/07/16 256 lb 4 oz (116.2 kg)     Thyroid function tests as follows:     Lab Results  Component Value Date   TSH <0.01 (L) 04/21/2016   TSH 0.03 (L) 04/07/2016   TSH 0.93 05/29/2015    No results found for: THYROTRECAB   Allergies as of 06/30/2016      Reactions   Latex Itching, Rash      Medication List       Accurate as of 06/30/16  4:15 PM. Always use your most recent med list.          albuterol 108 (90 Base) MCG/ACT inhaler Commonly known as:  PROVENTIL HFA;VENTOLIN HFA Inhale 2 puffs into the lungs every 4 (four) hours as needed for wheezing.   HYDROcodone-acetaminophen 5-325 MG tablet Commonly known as:  NORCO/VICODIN Take 2 tablets by mouth every 6 (six) hours as needed for moderate pain.   naproxen sodium 220 MG tablet Commonly known as:  ANAPROX Take 220 mg by mouth 2 (two) times daily with a meal. Takes 2 every 4-6  hours   traZODone 100 MG tablet Commonly known as:  DESYREL Take 1 tablet (100 mg total) by mouth at bedtime.           Past Medical History:  Diagnosis Date  . Allergy   . Anemia   . Arthritis   . Asthma   . Blood transfusion without reported diagnosis   . Depression   . Frequent headaches   . History of phlebitis   . Hx of adenomatous polyp of colon 08/15/2015  . Palpitations    heart monitor all normal per pt.2017  . PE (pulmonary embolism)     Past Surgical History:  Procedure Laterality Date  . ABDOMINAL HYSTERECTOMY  03/01/2004  . CHOLECYSTECTOMY      Family History  Problem Relation Age of Onset  . Colon polyps  Mother   . Ovarian cancer Mother   . Arthritis Mother   . Colon cancer Maternal Aunt   . Esophageal cancer Neg Hx   . Rectal cancer Neg Hx   . Stomach cancer Neg Hx     Social History:  reports that she quit smoking about 10 months ago. Her smoking use included Cigarettes. She has a 15.00 pack-year smoking history. She has never used smokeless tobacco. She reports that she drinks about 4.2 oz of alcohol per week . She reports that she does not use drugs.  Allergies:  Allergies  Allergen Reactions  . Latex Itching and Rash     Review of Systems  Constitutional: Negative for weight gain and reduced appetite.  HENT: Negative for trouble swallowing.   Eyes: Negative for blurred vision.  Respiratory: Negative for shortness of breath.   Cardiovascular: Positive for leg swelling. Negative for palpitations.       Sometimes may have some swelling in her legs  Gastrointestinal: Positive for constipation.  Endocrine: Positive for fatigue and heat intolerance.  Musculoskeletal: Positive for joint pain.  Skin: Negative for itching.  Neurological: Negative for weakness.      Examination:   BP 130/76   Pulse 82   Ht 5\' 6"  (1.676 m)   Wt 253 lb 6.4 oz (114.9 kg)   LMP 03/01/2004   SpO2 98%   BMI 40.90 kg/m    General Appearance:  well-built  and nourished, pleasant, not anxious or hyperkinetic.         Eyes: No excessive prominence, lid lag or stare present. No swelling of the eyelids present  Neck: The thyroid is enlarged on the left side, firm, somewhat irregular and 2-2.5 times normal Right side is not clearly palpable No tenderness There is no lymphadenopathy in the neck .           Heart: normal S1 and S2, no murmurs .          Lungs: breath sounds are clear bilaterally Abdomen: no hepatosplenomegaly or other palpable abnormality  Extremities: hands are warm.  No ankle edema.  Neurological:  No tremors are present. Deep tendon reflexes at biceps are brisk.  Skin: No rash, abnormal thickening of the skin on the lower legs seen     Assessment/Plan:   Hyperthyroidism, diagnosed in 2/18 Differential diagnosis is between toxic nodular goiter and Graves' disease, she does have an nodular left lobe enlargement and no signs of Graves ophthalmopathy However her symptoms appear to be improved especially of palpitations in the last few weeks and not clear if she is still hyperthyroid Her exam does not show overt signs of hyperthyroidism today  Recommendations:  Check a full thyroid panel today  Thyrotropin receptor antibody to be checked  If antibody is negative will get thyroid scan  Treatment plan Will depend on results of above studies  Patient handout on hyperthyroidism and radioactive iodine treatment given Patient understands the above discussion and treatment options. All questions were answered satisfactorily  Consult note sent to referring physician  [facility] 06/30/2016, 4:15 PM    Note: This office note was prepared with Dragon voice recognition system technology. Any transcriptional errors that result from this process are unintentional.

## 2016-07-01 ENCOUNTER — Telehealth: Payer: Self-pay | Admitting: Endocrinology

## 2016-07-01 ENCOUNTER — Other Ambulatory Visit: Payer: Self-pay

## 2016-07-01 LAB — THYROTROPIN RECEPTOR AUTOABS: THYROTROPIN RECEPTOR AB: 4.43 IU/L — AB (ref 0.00–1.75)

## 2016-07-01 MED ORDER — METHIMAZOLE 10 MG PO TABS: 10.0000 mg | ORAL_TABLET | Freq: Every day | ORAL | 3 refills | Status: DC

## 2016-07-01 NOTE — Telephone Encounter (Signed)
Pateint returning call about labs.

## 2016-07-01 NOTE — Progress Notes (Signed)
Please call to let patient know that the thyroid levels are still significantly high, to start methimazole 10 mg daily, needs prescription, and keep follow-up appointment

## 2016-07-01 NOTE — Telephone Encounter (Signed)
Patient is returning your call.  

## 2016-07-05 ENCOUNTER — Telehealth: Payer: Self-pay

## 2016-07-05 ENCOUNTER — Other Ambulatory Visit: Payer: Self-pay | Admitting: Internal Medicine

## 2016-07-05 DIAGNOSIS — G894 Chronic pain syndrome: Secondary | ICD-10-CM

## 2016-07-05 MED ORDER — TRAZODONE HCL 100 MG PO TABS: 100.0000 mg | ORAL_TABLET | Freq: Every day | ORAL | 3 refills | Status: DC

## 2016-07-05 NOTE — Telephone Encounter (Signed)
[facility] rq rf of trazodone 90 day supply. Please advise.

## 2016-07-27 ENCOUNTER — Other Ambulatory Visit (INDEPENDENT_AMBULATORY_CARE_PROVIDER_SITE_OTHER): Payer: BLUE CROSS/BLUE SHIELD

## 2016-07-27 ENCOUNTER — Other Ambulatory Visit: Payer: BLUE CROSS/BLUE SHIELD

## 2016-07-27 DIAGNOSIS — E0590 Thyrotoxicosis, unspecified without thyrotoxic crisis or storm: Secondary | ICD-10-CM

## 2016-07-27 LAB — T3, FREE: T3, Free: 5 pg/mL — ABNORMAL HIGH (ref 2.3–4.2)

## 2016-07-28 NOTE — Progress Notes (Signed)
She is supposed to be seen right after her labs but appointment is not until 6/20: Please have her come in ASAP

## 2016-08-02 ENCOUNTER — Ambulatory Visit (INDEPENDENT_AMBULATORY_CARE_PROVIDER_SITE_OTHER): Payer: BLUE CROSS/BLUE SHIELD | Admitting: Internal Medicine

## 2016-08-02 ENCOUNTER — Encounter: Payer: Self-pay | Admitting: Internal Medicine

## 2016-08-02 VITALS — BP 148/80 | HR 70 | Temp 98.5°F | Resp 16 | Ht 66.0 in | Wt 254.2 lb

## 2016-08-02 DIAGNOSIS — G894 Chronic pain syndrome: Secondary | ICD-10-CM

## 2016-08-02 DIAGNOSIS — M5440 Lumbago with sciatica, unspecified side: Secondary | ICD-10-CM

## 2016-08-02 DIAGNOSIS — M170 Bilateral primary osteoarthritis of knee: Secondary | ICD-10-CM

## 2016-08-02 DIAGNOSIS — M160 Bilateral primary osteoarthritis of hip: Secondary | ICD-10-CM | POA: Diagnosis not present

## 2016-08-02 DIAGNOSIS — G8929 Other chronic pain: Secondary | ICD-10-CM | POA: Diagnosis not present

## 2016-08-02 DIAGNOSIS — M545 Low back pain, unspecified: Secondary | ICD-10-CM

## 2016-08-02 DIAGNOSIS — I10 Essential (primary) hypertension: Secondary | ICD-10-CM

## 2016-08-02 DIAGNOSIS — E0590 Thyrotoxicosis, unspecified without thyrotoxic crisis or storm: Secondary | ICD-10-CM | POA: Diagnosis not present

## 2016-08-02 MED ORDER — HYDROCODONE-ACETAMINOPHEN 5-325 MG PO TABS: 2.0000 | ORAL_TABLET | Freq: Four times a day (QID) | ORAL | 0 refills | Status: DC | PRN

## 2016-08-02 MED ORDER — IBUPROFEN 600 MG PO TABS: 600.0000 mg | ORAL_TABLET | Freq: Three times a day (TID) | ORAL | 3 refills | Status: DC | PRN

## 2016-08-02 NOTE — Progress Notes (Signed)
Subjective:  Patient ID: Denise Morgan, female    DOB: 22-May-1965  Age: 51 y.o. MRN: [id number]  CC: Hypertension; Osteoarthritis; and Back Pain   HPI Denise Morgan presents for f/up - Since I last saw her she has been diagnosed with hyperthyroidism and is taking methimazole. She is tolerating it well but notices no difference based on her symptoms. Today she complains of chronic pain in her back, hips, and knees. She is not willing to do physical therapy, to consider joint injections, or any surgical procedures. She said she tried those when she live in [place] and they were not successful. She is also not willing to take an SSRI like duloxetine to see if it would help with her chronic pain. She has been taking her sister's supply of Motrin 600 mg and says it helps with the pain and has not caused any bleeding complications.  Outpatient Medications Prior to Visit  Medication Sig Dispense Refill  . albuterol (PROVENTIL HFA;VENTOLIN HFA) 108 (90 Base) MCG/ACT inhaler Inhale 2 puffs into the lungs every 4 (four) hours as needed for wheezing. 1 Inhaler 11  . methimazole (TAPAZOLE) 10 MG tablet Take 1 tablet (10 mg total) by mouth daily. 30 tablet 3  . traZODone (DESYREL) 100 MG tablet Take 1 tablet (100 mg total) by mouth at bedtime. 90 tablet 3  . HYDROcodone-acetaminophen (NORCO/VICODIN) 5-325 MG tablet Take 2 tablets by mouth every 6 (six) hours as needed for moderate pain. 90 tablet 0  . naproxen sodium (ANAPROX) 220 MG tablet Take 220 mg by mouth 2 (two) times daily with a meal. Takes 2 every 4-6 hours     No facility-administered medications prior to visit.     ROS Review of Systems  Constitutional: Negative.  Negative for activity change, appetite change, diaphoresis and unexpected weight change.  HENT: Negative.  Negative for nosebleeds.   Eyes: Negative for visual disturbance.  Respiratory: Negative for cough, chest tightness, shortness of breath and wheezing.   Cardiovascular:  Negative for chest pain, palpitations and leg swelling.  Gastrointestinal: Negative for abdominal pain, constipation, diarrhea, nausea and vomiting.  Endocrine: Negative.  Negative for cold intolerance and heat intolerance.  Genitourinary: Negative.  Negative for difficulty urinating.  Musculoskeletal: Positive for arthralgias and back pain. Negative for joint swelling, myalgias and neck pain.  Skin: Negative.   Allergic/Immunologic: Negative.   Neurological: Negative.  Negative for dizziness, weakness and headaches.  Hematological: Negative for adenopathy. Does not bruise/bleed easily.  Psychiatric/Behavioral: Negative.     Objective:  BP (!) 148/80 (BP Location: Left Arm, Patient Position: Sitting, Cuff Size: Large)   Pulse 70   Temp 98.5 F (36.9 C) (Oral)   Ht 5\' 6"  (1.676 m)   Wt 254 lb 4 oz (115.3 kg)   LMP 03/01/2004   SpO2 98%   BMI 41.04 kg/m   BP Readings from Last 3 Encounters:  08/02/16 (!) 148/80  06/30/16 130/76  04/21/16 (!) 144/80    Wt Readings from Last 3 Encounters:  08/02/16 254 lb 4 oz (115.3 kg)  06/30/16 253 lb 6.4 oz (114.9 kg)  04/21/16 250 lb 8 oz (113.6 kg)    Physical Exam  Constitutional: She is oriented to person, place, and time. No distress.  HENT:  Mouth/Throat: Oropharynx is clear and moist. No oropharyngeal exudate.  Eyes: Conjunctivae are normal. Right eye exhibits no discharge. Left eye exhibits no discharge. No scleral icterus.  Neck: Normal range of motion. Neck supple. No thyromegaly present.  Cardiovascular: Normal rate  and regular rhythm.   No murmur heard. Pulmonary/Chest: Effort normal and breath sounds normal. No respiratory distress. She has no wheezes. She exhibits no tenderness.  Abdominal: Soft. Bowel sounds are normal. She exhibits no distension and no mass. There is no tenderness. There is no rebound and no guarding.  Musculoskeletal: She exhibits no edema or tenderness.       Right knee: She exhibits deformity (DJD).  She exhibits normal range of motion, no swelling and no bony tenderness. No tenderness found.       Left knee: She exhibits deformity (DJD). She exhibits normal range of motion, no swelling, no effusion, no erythema and no bony tenderness. No tenderness found.  Lymphadenopathy:    She has no cervical adenopathy.  Neurological: She is alert and oriented to person, place, and time.  Skin: Skin is warm and dry. No rash noted. She is not diaphoretic. No erythema. No pallor.  Psychiatric: She has a normal mood and affect. Her behavior is normal. Judgment and thought content normal.  Vitals reviewed.   Lab Results  Component Value Date   WBC 4.3 04/21/2016   HGB 12.1 04/21/2016   HCT 35.9 (L) 04/21/2016   PLT 230.0 04/21/2016   GLUCOSE 89 04/07/2016   CHOL 178 04/21/2016   TRIG 187.0 (H) 04/21/2016   HDL 40.80 04/21/2016   LDLDIRECT 155.6 02/16/2013   LDLCALC 100 (H) 04/21/2016   ALT 16 04/07/2016   AST 18 04/07/2016   NA 138 04/07/2016   K 3.8 04/07/2016   CL 106 04/07/2016   CREATININE 0.48 04/07/2016   BUN 10 04/07/2016   CO2 25 04/07/2016   TSH <0.01 (L) 06/30/2016   INR 3.2 11/24/2012   Results for Denise Morgan (MRN [id number]) as of 08/02/2016 08:10  Ref. Range 06/30/2016 16:33 07/27/2016 09:02  TSH Latest Ref Range: 0.35 - 4.50 uIU/mL <0.01 (L)   Triiodothyronine,Free,Serum Latest Ref Range: 2.3 - 4.2 pg/mL 6.5 (H) 5.0 (H)  T4,Free(Direct) Latest Ref Range: 0.60 - 1.60 ng/dL 1.84 (H)   Thyrotropin Receptor Ab Latest Ref Range: 0.00 - 1.75 IU/L 4.43 (H)      Mm Digital Screening Bilateral  Result Date: 06/30/2015 CLINICAL DATA:  Screening. EXAM: DIGITAL SCREENING BILATERAL MAMMOGRAM WITH CAD COMPARISON:  Previous exam(s). ACR Breast Density Category b: There are scattered areas of fibroglandular density. FINDINGS: There are no findings suspicious for malignancy. Images were processed with CAD. IMPRESSION: No mammographic evidence of malignancy. A result letter of this  screening mammogram will be mailed directly to the patient. RECOMMENDATION: Screening mammogram in one year. (Code:SM-B-01Y) BI-RADS CATEGORY  1: Negative. Electronically Signed   By: Lovey Newcomer M.D.   On: 07/01/2015 17:47    Assessment & Plan:   Denise Morgan was seen today for hypertension, osteoarthritis and back pain.  Diagnoses and all orders for this visit:  Hyperthyroidism- she sees endocrinology tomorrow for follow-up on this  Essential hypertension- her blood pressure is adequately well controlled  Chronic bilateral low back pain without sciatica -     ibuprofen (ADVIL,MOTRIN) 600 MG tablet; Take 1 tablet (600 mg total) by mouth every 8 (eight) hours as needed. -     Discontinue: HYDROcodone-acetaminophen (NORCO/VICODIN) 5-325 MG tablet; Take 2 tablets by mouth every 6 (six) hours as needed for moderate pain. -     Discontinue: HYDROcodone-acetaminophen (NORCO/VICODIN) 5-325 MG tablet; Take 2 tablets by mouth every 6 (six) hours as needed for moderate pain. -     Discontinue: HYDROcodone-acetaminophen (NORCO/VICODIN) 5-325 MG tablet; Take  2 tablets by mouth every 6 (six) hours as needed for moderate pain. -     HYDROcodone-acetaminophen (NORCO/VICODIN) 5-325 MG tablet; Take 2 tablets by mouth every 6 (six) hours as needed for moderate pain.  Primary osteoarthritis of both hips -     ibuprofen (ADVIL,MOTRIN) 600 MG tablet; Take 1 tablet (600 mg total) by mouth every 8 (eight) hours as needed. -     Discontinue: HYDROcodone-acetaminophen (NORCO/VICODIN) 5-325 MG tablet; Take 2 tablets by mouth every 6 (six) hours as needed for moderate pain. -     Discontinue: HYDROcodone-acetaminophen (NORCO/VICODIN) 5-325 MG tablet; Take 2 tablets by mouth every 6 (six) hours as needed for moderate pain. -     Discontinue: HYDROcodone-acetaminophen (NORCO/VICODIN) 5-325 MG tablet; Take 2 tablets by mouth every 6 (six) hours as needed for moderate pain. -     HYDROcodone-acetaminophen (NORCO/VICODIN) 5-325  MG tablet; Take 2 tablets by mouth every 6 (six) hours as needed for moderate pain.  Primary osteoarthritis of both knees -     ibuprofen (ADVIL,MOTRIN) 600 MG tablet; Take 1 tablet (600 mg total) by mouth every 8 (eight) hours as needed. -     Discontinue: HYDROcodone-acetaminophen (NORCO/VICODIN) 5-325 MG tablet; Take 2 tablets by mouth every 6 (six) hours as needed for moderate pain. -     Discontinue: HYDROcodone-acetaminophen (NORCO/VICODIN) 5-325 MG tablet; Take 2 tablets by mouth every 6 (six) hours as needed for moderate pain. -     Discontinue: HYDROcodone-acetaminophen (NORCO/VICODIN) 5-325 MG tablet; Take 2 tablets by mouth every 6 (six) hours as needed for moderate pain. -     HYDROcodone-acetaminophen (NORCO/VICODIN) 5-325 MG tablet; Take 2 tablets by mouth every 6 (six) hours as needed for moderate pain.  Chronic pain syndrome -     Discontinue: HYDROcodone-acetaminophen (NORCO/VICODIN) 5-325 MG tablet; Take 2 tablets by mouth every 6 (six) hours as needed for moderate pain. -     Discontinue: HYDROcodone-acetaminophen (NORCO/VICODIN) 5-325 MG tablet; Take 2 tablets by mouth every 6 (six) hours as needed for moderate pain. -     Discontinue: HYDROcodone-acetaminophen (NORCO/VICODIN) 5-325 MG tablet; Take 2 tablets by mouth every 6 (six) hours as needed for moderate pain. -     HYDROcodone-acetaminophen (NORCO/VICODIN) 5-325 MG tablet; Take 2 tablets by mouth every 6 (six) hours as needed for moderate pain.  Chronic low back pain with sciatica, sciatica laterality unspecified, unspecified back pain laterality   I have discontinued Ms. Enslin's naproxen sodium. I am also having her start on ibuprofen. Additionally, I am having her maintain her albuterol, methimazole, traZODone, and HYDROcodone-acetaminophen.  Meds ordered this encounter  Medications  . ibuprofen (ADVIL,MOTRIN) 600 MG tablet    Sig: Take 1 tablet (600 mg total) by mouth every 8 (eight) hours as needed.     Dispense:  90 tablet    Refill:  3  . DISCONTD: HYDROcodone-acetaminophen (NORCO/VICODIN) 5-325 MG tablet    Sig: Take 2 tablets by mouth every 6 (six) hours as needed for moderate pain.    Dispense:  90 tablet    Refill:  0  . DISCONTD: HYDROcodone-acetaminophen (NORCO/VICODIN) 5-325 MG tablet    Sig: Take 2 tablets by mouth every 6 (six) hours as needed for moderate pain.    Dispense:  90 tablet    Refill:  0  . DISCONTD: HYDROcodone-acetaminophen (NORCO/VICODIN) 5-325 MG tablet    Sig: Take 2 tablets by mouth every 6 (six) hours as needed for moderate pain.    Dispense:  90 tablet  Refill:  0  . HYDROcodone-acetaminophen (NORCO/VICODIN) 5-325 MG tablet    Sig: Take 2 tablets by mouth every 6 (six) hours as needed for moderate pain.    Dispense:  90 tablet    Refill:  0     Follow-up: Return in about 4 months (around 12/02/2016).  Scarlette Calico, MD

## 2016-08-02 NOTE — Patient Instructions (Signed)

## 2016-08-03 ENCOUNTER — Encounter: Payer: Self-pay | Admitting: Endocrinology

## 2016-08-03 ENCOUNTER — Ambulatory Visit (INDEPENDENT_AMBULATORY_CARE_PROVIDER_SITE_OTHER): Payer: BLUE CROSS/BLUE SHIELD | Admitting: Endocrinology

## 2016-08-03 VITALS — BP 140/84 | HR 74 | Ht 66.0 in | Wt 255.0 lb

## 2016-08-03 DIAGNOSIS — E0590 Thyrotoxicosis, unspecified without thyrotoxic crisis or storm: Secondary | ICD-10-CM

## 2016-08-03 MED ORDER — METHIMAZOLE 10 MG PO TABS: 10.0000 mg | ORAL_TABLET | Freq: Two times a day (BID) | ORAL | 3 refills | Status: DC

## 2016-08-03 NOTE — Patient Instructions (Signed)
Take rx 2x daily

## 2016-08-03 NOTE — Progress Notes (Signed)
Patient ID: Denise Morgan, female   DOB: August 18, 1965, 51 y.o.   MRN: [id number]                                                                                                               Reason for Appointment:  Hyperthyroidism, follow-up  Referring physician: Scarlette Calico   Chief complaint: Tiredness   History of Present Illness:   Background information obtained on initial consultation: For the last several months the patient has been complaining about feeling tired. However last year she was having a rapid heartbeat and palpitations which went on for several weeks and these improved more recently She apparently had lost about 30 pounds last year starting in October although she was supposedly trying to lose weight  She also has had some persistent symptoms of feeling shaky at times  She has had hot flashes and sweating for several years and this may be more prominent more recently She does not complain of increased bowel movements More recently her weight has leveled off  The patient was confirmed to have Graves' disease with high thyrotropin receptor antibody She has started taking methimazole 10 mg daily With this she is having less fatigue, much less shakiness and palpitations but still having some heat intolerance and sweating No side effects with methimazole  Wt Readings from Last 3 Encounters:  08/03/16 255 lb (115.7 kg)  08/02/16 254 lb 4 oz (115.3 kg)  06/30/16 253 lb 6.4 oz (114.9 kg)     Thyroid function tests as follows:     Lab Results  Component Value Date   FREET4 1.84 (H) 06/30/2016   T3FREE 5.0 (H) 07/27/2016   T3FREE 6.5 (H) 06/30/2016   TSH <0.01 (L) 06/30/2016   TSH <0.01 (L) 04/21/2016   TSH 0.03 (L) 04/07/2016    Lab Results  Component Value Date   THYROTRECAB 4.43 (H) 06/30/2016     Allergies as of 08/03/2016      Reactions   Latex Itching, Rash      Medication List       Accurate as of 08/03/16  1:25 PM. Always use your most recent  med list.          albuterol 108 (90 Base) MCG/ACT inhaler Commonly known as:  PROVENTIL HFA;VENTOLIN HFA Inhale 2 puffs into the lungs every 4 (four) hours as needed for wheezing.   HYDROcodone-acetaminophen 5-325 MG tablet Commonly known as:  NORCO/VICODIN Take 2 tablets by mouth every 6 (six) hours as needed for moderate pain.   ibuprofen 600 MG tablet Commonly known as:  ADVIL,MOTRIN Take 1 tablet (600 mg total) by mouth every 8 (eight) hours as needed.   methimazole 10 MG tablet Commonly known as:  TAPAZOLE Take 1 tablet (10 mg total) by mouth 2 (two) times daily.   traZODone 100 MG tablet Commonly known as:  DESYREL Take 1 tablet (100 mg total) by mouth at bedtime.           Past Medical History:  Diagnosis Date  . Allergy   .  Anemia   . Arthritis   . Asthma   . Blood transfusion without reported diagnosis   . Depression   . Frequent headaches   . History of phlebitis   . Hx of adenomatous polyp of colon 08/15/2015  . Palpitations    heart monitor all normal per pt.2017  . PE (pulmonary embolism)     Past Surgical History:  Procedure Laterality Date  . ABDOMINAL HYSTERECTOMY  03/01/2004  . CHOLECYSTECTOMY      Family History  Problem Relation Age of Onset  . Colon polyps Mother   . Ovarian cancer Mother   . Arthritis Mother   . Colon cancer Maternal Aunt   . Esophageal cancer Neg Hx   . Rectal cancer Neg Hx   . Stomach cancer Neg Hx     Social History:  reports that she quit smoking about a year ago. Her smoking use included Cigarettes. She has a 15.00 pack-year smoking history. She has never used smokeless tobacco. She reports that she drinks about 4.2 oz of alcohol per week . She reports that she does not use drugs.  Allergies:  Allergies  Allergen Reactions  . Latex Itching and Rash     Review of Systems  No history of hypertension   Examination:   BP (!) 158/104   Pulse 74   Ht 5\' 6"  (1.676 m)   Wt 255 lb (115.7 kg)   LMP  03/01/2004   SpO2 96%   BMI 41.16 kg/m   Her thyroid is enlarged on the left side, about twice normal Right side is just palpable No tremors are present. Deep tendon reflexes at biceps are slightly brisk.    Assessment/Plan:   Hyperthyroidism, diagnosed in 2/18  She does have Graves' disease and is doing subjectively better with starting 10 mg methimazole Her thyroid enlargement appears to be relatively smaller Her exam shows only mild hyperreflexia  Labs show her free T3 to be improved but not normal as yet   Recommendations: She will increase her methimazole to 10 mg twice a day and follow-up in 1 month Discussed possibility of I-131 treatment if she does not get into remission within a year  Brentney Goldbach 08/03/2016, 1:25 PM    Note: This office note was prepared with Estate agent. Any transcriptional errors that result from this process are unintentional.

## 2016-08-18 ENCOUNTER — Ambulatory Visit: Payer: BLUE CROSS/BLUE SHIELD | Admitting: Endocrinology

## 2016-09-03 ENCOUNTER — Other Ambulatory Visit (INDEPENDENT_AMBULATORY_CARE_PROVIDER_SITE_OTHER): Payer: BLUE CROSS/BLUE SHIELD

## 2016-09-03 DIAGNOSIS — E0590 Thyrotoxicosis, unspecified without thyrotoxic crisis or storm: Secondary | ICD-10-CM | POA: Diagnosis not present

## 2016-09-03 LAB — T4, FREE: FREE T4: 0.94 ng/dL (ref 0.60–1.60)

## 2016-09-08 ENCOUNTER — Encounter: Payer: Self-pay | Admitting: Endocrinology

## 2016-09-08 ENCOUNTER — Ambulatory Visit (INDEPENDENT_AMBULATORY_CARE_PROVIDER_SITE_OTHER): Payer: BLUE CROSS/BLUE SHIELD | Admitting: Endocrinology

## 2016-09-08 VITALS — BP 140/88 | HR 78 | Ht 66.0 in | Wt 258.2 lb

## 2016-09-08 DIAGNOSIS — R030 Elevated blood-pressure reading, without diagnosis of hypertension: Secondary | ICD-10-CM | POA: Diagnosis not present

## 2016-09-08 DIAGNOSIS — E0590 Thyrotoxicosis, unspecified without thyrotoxic crisis or storm: Secondary | ICD-10-CM | POA: Diagnosis not present

## 2016-09-08 NOTE — Patient Instructions (Signed)
Take 1 pill in am and 1/2 in pm

## 2016-09-08 NOTE — Progress Notes (Signed)
Patient ID: Denise Morgan, female   DOB: February 16, 1966, 51 y.o.   MRN: [id number]                                                                                                               Reason for Appointment:  Hyperthyroidism, follow-up  Referring physician: Scarlette Calico   Chief complaint: Tiredness   History of Present Illness:   Background information obtained on initial consultation: For the last several months the patient has been complaining about feeling tired. However last year she was having a rapid heartbeat and palpitations which went on for several weeks and these improved more recently She apparently had lost about 30 pounds last year starting in October although she was supposedly trying to lose weight  She also has had some persistent symptoms of feeling shaky at times  She has had hot flashes and sweating for several years and this may be more prominent more recently She does not complain of increased bowel movements More recently her weight has leveled off  RECENT history: The patient was confirmed to have Graves' disease with high thyrotropin receptor antibody in 5/18 She started taking methimazole and with this her symptoms have progressively improved  Her dose was increased up to 20 mg a day because of a higher T3 level on the last visit She feels fairly good without any fatigue, heat or cold intolerance Concerned about weight gain  No side effects with methimazole  Wt Readings from Last 3 Encounters:  09/08/16 258 lb 3.2 oz (117.1 kg)  08/03/16 255 lb (115.7 kg)  08/02/16 254 lb 4 oz (115.3 kg)     Thyroid function tests as follows:     Lab Results  Component Value Date   FREET4 0.94 09/03/2016   FREET4 1.84 (H) 06/30/2016   T3FREE 5.0 (H) 07/27/2016   T3FREE 6.5 (H) 06/30/2016   TSH <0.01 (L) 06/30/2016   TSH <0.01 (L) 04/21/2016   TSH 0.03 (L) 04/07/2016    Lab Results  Component Value Date   THYROTRECAB 4.43 (H) 06/30/2016      Allergies as of 09/08/2016      Reactions   Latex Itching, Rash      Medication List       Accurate as of 09/08/16  9:59 AM. Always use your most recent med list.          albuterol 108 (90 Base) MCG/ACT inhaler Commonly known as:  PROVENTIL HFA;VENTOLIN HFA Inhale 2 puffs into the lungs every 4 (four) hours as needed for wheezing.   HYDROcodone-acetaminophen 5-325 MG tablet Commonly known as:  NORCO/VICODIN Take 2 tablets by mouth every 6 (six) hours as needed for moderate pain.   ibuprofen 600 MG tablet Commonly known as:  ADVIL,MOTRIN Take 1 tablet (600 mg total) by mouth every 8 (eight) hours as needed.   methimazole 10 MG tablet Commonly known as:  TAPAZOLE Take 1 tablet (10 mg total) by mouth 2 (two) times daily.   traZODone 100 MG tablet Commonly known as:  DESYREL  Take 1 tablet (100 mg total) by mouth at bedtime.           Past Medical History:  Diagnosis Date  . Allergy   . Anemia   . Arthritis   . Asthma   . Blood transfusion without reported diagnosis   . Depression   . Frequent headaches   . History of phlebitis   . Hx of adenomatous polyp of colon 08/15/2015  . Palpitations    heart monitor all normal per pt.2017  . PE (pulmonary embolism)     Past Surgical History:  Procedure Laterality Date  . ABDOMINAL HYSTERECTOMY  03/01/2004  . CHOLECYSTECTOMY      Family History  Problem Relation Age of Onset  . Colon polyps Mother   . Ovarian cancer Mother   . Arthritis Mother   . Colon cancer Maternal Aunt   . Esophageal cancer Neg Hx   . Rectal cancer Neg Hx   . Stomach cancer Neg Hx     Social History:  reports that she quit smoking about 12 months ago. Her smoking use included Cigarettes. She has a 15.00 pack-year smoking history. She has never used smokeless tobacco. She reports that she drinks about 4.2 oz of alcohol per week . She reports that she does not use drugs.  Allergies:  Allergies  Allergen Reactions  . Latex Itching  and Rash     Review of Systems  No history of hypertension  BP Readings from Last 3 Encounters:  09/08/16 140/88  08/03/16 140/84  08/02/16 (!) 148/80      Examination:   BP 140/88   Pulse 78   Ht 5\' 6"  (1.676 m)   Wt 258 lb 3.2 oz (117.1 kg)   LMP 03/01/2004   SpO2 96%   BMI 41.67 kg/m   Her thyroid is Not clearly enlarged, fullness on the right side present  Deep tendon reflexes at biceps are normal. No peripheral edema   Assessment/Plan:   Hyperthyroidism, from Graves' disease diagnosed in 2/18  She does have normal thyroid levels now with taking 10 mg twice a day of methimazole Subjectively doing well also Her thyroid enlargement appears to be relatively smaller She has had mild weight gain as expected   Recommendations: She will decrease her methimazole to 10 mg in the morning and 5 mg in the evening and follow-up in 6 weeks Will check thyrotropin receptor antibody also on the next visit  ?  Hypertension: Discussed cutting back on high sodium foods, regular exercise and low fat meals   Aneisa Karren 09/08/2016, 9:59 AM    Note: This office note was prepared with Estate agent. Any transcriptional errors that result from this process are unintentional.

## 2016-10-22 ENCOUNTER — Other Ambulatory Visit (INDEPENDENT_AMBULATORY_CARE_PROVIDER_SITE_OTHER): Payer: BLUE CROSS/BLUE SHIELD

## 2016-10-22 DIAGNOSIS — E0590 Thyrotoxicosis, unspecified without thyrotoxic crisis or storm: Secondary | ICD-10-CM | POA: Diagnosis not present

## 2016-10-22 LAB — T4, FREE: Free T4: 0.81 ng/dL (ref 0.60–1.60)

## 2016-10-22 LAB — TSH: TSH: 0.04 u[IU]/mL — AB (ref 0.35–4.50)

## 2016-10-26 ENCOUNTER — Other Ambulatory Visit: Payer: Self-pay

## 2016-10-26 ENCOUNTER — Encounter: Payer: Self-pay | Admitting: Endocrinology

## 2016-10-26 ENCOUNTER — Ambulatory Visit (INDEPENDENT_AMBULATORY_CARE_PROVIDER_SITE_OTHER): Payer: BLUE CROSS/BLUE SHIELD | Admitting: Endocrinology

## 2016-10-26 VITALS — BP 142/94 | HR 76 | Ht 66.0 in | Wt 259.6 lb

## 2016-10-26 DIAGNOSIS — E0590 Thyrotoxicosis, unspecified without thyrotoxic crisis or storm: Secondary | ICD-10-CM

## 2016-10-26 DIAGNOSIS — I10 Essential (primary) hypertension: Secondary | ICD-10-CM | POA: Diagnosis not present

## 2016-10-26 LAB — THYROTROPIN RECEPTOR AUTOABS: THYROTROPIN RECEPTOR AB: 3.58 IU/L — AB (ref 0.00–1.75)

## 2016-10-26 MED ORDER — AMLODIPINE BESYLATE 5 MG PO TABS: 5.0000 mg | ORAL_TABLET | Freq: Every day | ORAL | 0 refills | Status: DC

## 2016-10-26 NOTE — Patient Instructions (Signed)
Magox for cramps

## 2016-10-26 NOTE — Progress Notes (Signed)
Patient ID: Denise Morgan, female   DOB: 11/08/1965, 51 y.o.   MRN: [id number]                                                                                                               Reason for Appointment:  Hyperthyroidism, follow-up  Referring physician: Scarlette Calico   Chief complaint: Tiredness   History of Present Illness:   Background information obtained on initial consultation: For the last several months the patient has been complaining about feeling tired. However last year she was having a rapid heartbeat and palpitations which went on for several weeks and these improved more recently She apparently had lost about 30 pounds last year starting in October although she was supposedly trying to lose weight  She also has had some persistent symptoms of feeling shaky at times  She has had hot flashes and sweating for several years and this may be more prominent more recently She does not complain of increased bowel movements  RECENT history: The patient was confirmed to have Graves' disease with high thyrotropin receptor antibody in 5/18 She started taking methimazole and with this her symptoms above improved  Her dose was increased up to 20 mg a day because of a higher T3 level in 5/18  On her last visit in 7/18 her free T4 had come down significantly, she was told to take 15 mg methimazole a day at that time However she misunderstood and is taking only 10 mg in the morning  She feels fairly good without any new fatigue, heat or cold intolerance, no palpitations Her weight has leveled off   Wt Readings from Last 3 Encounters:  10/26/16 259 lb 9.6 oz (117.8 kg)  09/08/16 258 lb 3.2 oz (117.1 kg)  08/03/16 255 lb (115.7 kg)     Thyroid function tests as follows:     Lab Results  Component Value Date   FREET4 0.81 10/22/2016   FREET4 0.94 09/03/2016   FREET4 1.84 (H) 06/30/2016   T3FREE 5.0 (H) 07/27/2016   T3FREE 6.5 (H) 06/30/2016   TSH 0.04 (L)  10/22/2016   TSH <0.01 (L) 06/30/2016   TSH <0.01 (L) 04/21/2016    Lab Results  Component Value Date   THYROTRECAB 3.58 (H) 10/22/2016   THYROTRECAB 4.43 (H) 06/30/2016     Allergies as of 10/26/2016      Reactions   Latex Itching, Rash      Medication List       Accurate as of 10/26/16  4:50 PM. Always use your most recent med list.          albuterol 108 (90 Base) MCG/ACT inhaler Commonly known as:  PROVENTIL HFA;VENTOLIN HFA Inhale 2 puffs into the lungs every 4 (four) hours as needed for wheezing.   amLODipine 5 MG tablet Commonly known as:  NORVASC Take 1 tablet (5 mg total) by mouth daily.   HYDROcodone-acetaminophen 5-325 MG tablet Commonly known as:  NORCO/VICODIN Take 2 tablets by mouth every 6 (six) hours as needed for moderate  pain.   ibuprofen 600 MG tablet Commonly known as:  ADVIL,MOTRIN Take 1 tablet (600 mg total) by mouth every 8 (eight) hours as needed.   methimazole 10 MG tablet Commonly known as:  TAPAZOLE Take 1 tablet (10 mg total) by mouth 2 (two) times daily.   traZODone 100 MG tablet Commonly known as:  DESYREL Take 1 tablet (100 mg total) by mouth at bedtime.            Discharge Care Instructions        Start     Ordered   10/26/16 0000  amLODipine (NORVASC) 5 MG tablet  Daily     10/26/16 0845   10/26/16 0000  TSH     10/26/16 0845   10/26/16 0000  T4, free     10/26/16 0845   10/26/16 0000  T3, free     10/26/16 0845          Past Medical History:  Diagnosis Date  . Allergy   . Anemia   . Arthritis   . Asthma   . Blood transfusion without reported diagnosis   . Depression   . Frequent headaches   . History of phlebitis   . Hx of adenomatous polyp of colon 08/15/2015  . Palpitations    heart monitor all normal per pt.2017  . PE (pulmonary embolism)     Past Surgical History:  Procedure Laterality Date  . ABDOMINAL HYSTERECTOMY  03/01/2004  . CHOLECYSTECTOMY      Family History  Problem Relation Age  of Onset  . Colon polyps Mother   . Ovarian cancer Mother   . Arthritis Mother   . Colon cancer Maternal Aunt   . Esophageal cancer Neg Hx   . Rectal cancer Neg Hx   . Stomach cancer Neg Hx     Social History:  reports that she quit smoking about 14 months ago. Her smoking use included Cigarettes. She has a 15.00 pack-year smoking history. She has never used smokeless tobacco. She reports that she drinks about 4.2 oz of alcohol per week . She reports that she does not use drugs.  Allergies:  Allergies  Allergen Reactions  . Latex Itching and Rash     Review of Systems  Has not been treated for hypertension. She is recently complaining about headaches  BP Readings from Last 3 Encounters:  10/26/16 (!) 142/94  09/08/16 140/88  08/03/16 140/84    She says she has had muscle cramps on and off, more recently, these can occur day or night and some in her neck also    Examination:   BP (!) 142/94   Pulse 76   Ht 5\' 6"  (1.676 m)   Wt 259 lb 9.6 oz (117.8 kg)   LMP 03/01/2004   SpO2 96%   BMI 41.90 kg/m   Repeat blood pressure 142/98   Thyroid not palpable Deep tendon reflexes at biceps are normal. No peripheral edema   Assessment/Plan:   Hyperthyroidism, from Graves' disease diagnosed in 2/18   She has been able to cut back on her methimazole dosage without any recurrence of hyperthyroidism  Although she was supposed to take 15 mg daily of methimazole she is on her own taking only 10 mg She does have normal thyroid levels now with 10 mg a day of methimazole Her thyroid enlargement appears to be relatively smaller  Recommendations: She will continue 10 mg methimazole daily Will check thyrotropin receptor antibody report when available Follow-up in 6 weeks  with repeat labs   Hypertension: She has persistently high blood pressure readings and is not complaining of headaches also She will start Norvasc 5 mg daily She will call to schedule follow-up with PCP    Muscle cramps: Not clear if this is related to her change in thyroid levels.  She can try OTC magnesium empirically and follow-up with PCP She will need to have evaluation of her electrolytes done by PCP   Total visit time for evaluation and management of multiple problems = 25 minutes  Lawsen Arnott 10/26/2016, 4:50 PM   ADDENDUM: Her thyrotropin receptor antibody is still high at 3.58 and will probably not taper off her methimazole as fast in the future   Note: This office note was prepared with Dragon voice recognition system technology. Any transcriptional errors that result from this process are unintentional.

## 2016-11-29 ENCOUNTER — Other Ambulatory Visit (INDEPENDENT_AMBULATORY_CARE_PROVIDER_SITE_OTHER): Payer: BLUE CROSS/BLUE SHIELD

## 2016-11-29 DIAGNOSIS — E0590 Thyrotoxicosis, unspecified without thyrotoxic crisis or storm: Secondary | ICD-10-CM

## 2016-11-29 LAB — T3, FREE: T3 FREE: 2.9 pg/mL (ref 2.3–4.2)

## 2016-11-29 LAB — TSH: TSH: 2.13 u[IU]/mL (ref 0.35–4.50)

## 2016-11-29 LAB — T4, FREE: FREE T4: 0.74 ng/dL (ref 0.60–1.60)

## 2016-12-02 ENCOUNTER — Other Ambulatory Visit: Payer: Self-pay | Admitting: Endocrinology

## 2016-12-02 ENCOUNTER — Ambulatory Visit (INDEPENDENT_AMBULATORY_CARE_PROVIDER_SITE_OTHER): Payer: BLUE CROSS/BLUE SHIELD | Admitting: Endocrinology

## 2016-12-02 ENCOUNTER — Encounter: Payer: Self-pay | Admitting: Endocrinology

## 2016-12-02 VITALS — BP 126/88 | HR 71 | Ht 66.0 in | Wt 260.8 lb

## 2016-12-02 DIAGNOSIS — E0590 Thyrotoxicosis, unspecified without thyrotoxic crisis or storm: Secondary | ICD-10-CM | POA: Diagnosis not present

## 2016-12-02 DIAGNOSIS — I10 Essential (primary) hypertension: Secondary | ICD-10-CM | POA: Diagnosis not present

## 2016-12-02 MED ORDER — HYDROCHLOROTHIAZIDE 12.5 MG PO CAPS: 12.5000 mg | ORAL_CAPSULE | Freq: Every day | ORAL | 1 refills | Status: DC

## 2016-12-02 NOTE — Progress Notes (Signed)
Patient ID: Denise Morgan, female   DOB: Apr 03, 1965, 51 y.o.   MRN: [id number]                                                                                                               Reason for Appointment:  Hyperthyroidism, follow-up  Referring physician: Scarlette Calico   Chief complaint: Tiredness   History of Present Illness:   Background information obtained on initial consultation: For the last several months the patient has been complaining about feeling tired. However last year she was having a rapid heartbeat and palpitations which went on for several weeks and these improved more recently She apparently had lost about 30 pounds last year starting in October although she was supposedly trying to lose weight  She also has had some persistent symptoms of feeling shaky at times  She has had hot flashes and sweating for several years and this may be more prominent more recently She does not complain of increased bowel movements  RECENT history: The patient was confirmed to have Graves' disease with high thyrotropin receptor antibody in 5/18 She started taking methimazole and with this her symptoms above improved Her dose was increased up to 20 mg a day because of a higher T3 level in 5/18  Subsequently her methimazole dose has been progressively lower Since 7/18 has been taking 10 mg daily  She again feels fairly good, not complaining of any change in her energy level or skin or hair changes No cold intolerance  Her free T4 is gradually coming lower and now her TSH is now normal range She is taking her methimazole 10 mg tablet daily regularly  Most recent thyrotropin receptor antibody was still above normal in August  Wt Readings from Last 3 Encounters:  12/02/16 260 lb 12.8 oz (118.3 kg)  10/26/16 259 lb 9.6 oz (117.8 kg)  09/08/16 258 lb 3.2 oz (117.1 kg)     Thyroid function tests as follows:     Lab Results  Component Value Date   FREET4 0.74 11/29/2016     FREET4 0.81 10/22/2016   FREET4 0.94 09/03/2016   T3FREE 2.9 11/29/2016   T3FREE 5.0 (H) 07/27/2016   T3FREE 6.5 (H) 06/30/2016   TSH 2.13 11/29/2016   TSH 0.04 (L) 10/22/2016   TSH <0.01 (L) 06/30/2016    Lab Results  Component Value Date   THYROTRECAB 3.58 (H) 10/22/2016   THYROTRECAB 4.43 (H) 06/30/2016     Allergies as of 12/02/2016      Reactions   Latex Itching, Rash      Medication List       Accurate as of 12/02/16  9:02 AM. Always use your most recent med list.          albuterol 108 (90 Base) MCG/ACT inhaler Commonly known as:  PROVENTIL HFA;VENTOLIN HFA Inhale 2 puffs into the lungs every 4 (four) hours as needed for wheezing.   amLODipine 5 MG tablet Commonly known as:  NORVASC Take 1 tablet (5 mg total) by mouth daily.  HYDROcodone-acetaminophen 5-325 MG tablet Commonly known as:  NORCO/VICODIN Take 2 tablets by mouth every 6 (six) hours as needed for moderate pain.   ibuprofen 600 MG tablet Commonly known as:  ADVIL,MOTRIN Take 1 tablet (600 mg total) by mouth every 8 (eight) hours as needed.   meloxicam 7.5 MG tablet Commonly known as:  MOBIC Take 7.5 mg by mouth daily.   methimazole 10 MG tablet Commonly known as:  TAPAZOLE Take 1 tablet (10 mg total) by mouth 2 (two) times daily.   traZODone 100 MG tablet Commonly known as:  DESYREL Take 1 tablet (100 mg total) by mouth at bedtime.           Past Medical History:  Diagnosis Date  . Allergy   . Anemia   . Arthritis   . Asthma   . Blood transfusion without reported diagnosis   . Depression   . Frequent headaches   . History of phlebitis   . Hx of adenomatous polyp of colon 08/15/2015  . Palpitations    heart monitor all normal per pt.2017  . PE (pulmonary embolism)     Past Surgical History:  Procedure Laterality Date  . ABDOMINAL HYSTERECTOMY  03/01/2004  . CHOLECYSTECTOMY      Family History  Problem Relation Age of Onset  . Colon polyps Mother   . Ovarian cancer  Mother   . Arthritis Mother   . Colon cancer Maternal Aunt   . Esophageal cancer Neg Hx   . Rectal cancer Neg Hx   . Stomach cancer Neg Hx     Social History:  reports that she quit smoking about 15 months ago. Her smoking use included Cigarettes. She has a 15.00 pack-year smoking history. She has never used smokeless tobacco. She reports that she drinks about 4.2 oz of alcohol per week . She reports that she does not use drugs.  Allergies:  Allergies  Allergen Reactions  . Latex Itching and Rash     Review of Systems  She was started on amlodipine 5 mg daily in 8/18 because of persistently high blood pressure readings and complaints of headache No headaches recently  BP Readings from Last 3 Encounters:  12/02/16 126/88  10/26/16 (!) 142/94  09/08/16 140/88    She says she has had muscle cramps on and off, less recently     Examination:   BP 126/88   Pulse 71   Ht 5\' 6"  (1.676 m)   Wt 260 lb 12.8 oz (118.3 kg)   LMP 03/01/2004   SpO2 97%   BMI 42.09 kg/m     Thyroid not palpable No tremor Deep tendon reflexes at biceps are normal. Skin appearance is normal   Assessment/Plan:   Hyperthyroidism, from Graves' disease diagnosed in 2/18   She has been able to cut back on her methimazole dosage gradually now With 10 mg methimazole her free T4 is trending lower and now low-normal along with a normalized TSH level Subjectively doing well also  Recommendations: She will take 10 mg alternating with 5 mg Follow-up in 6 weeks again   Hypertension: She has continued increase blood pressure Discussed low sodium diet Add HCTZ to her Norvasc She will call to schedule follow-up with PCP       [facility] 12/02/2016, 9:02 AM     Note: This office note was prepared with Estate agent. Any transcriptional errors that result from this process are unintentional.

## 2016-12-15 ENCOUNTER — Other Ambulatory Visit (INDEPENDENT_AMBULATORY_CARE_PROVIDER_SITE_OTHER): Payer: BLUE CROSS/BLUE SHIELD

## 2016-12-15 ENCOUNTER — Encounter: Payer: Self-pay | Admitting: Internal Medicine

## 2016-12-15 ENCOUNTER — Ambulatory Visit (INDEPENDENT_AMBULATORY_CARE_PROVIDER_SITE_OTHER): Payer: BLUE CROSS/BLUE SHIELD | Admitting: Internal Medicine

## 2016-12-15 VITALS — BP 124/84 | HR 64 | Temp 98.9°F | Resp 16 | Ht 66.0 in | Wt 260.8 lb

## 2016-12-15 DIAGNOSIS — I10 Essential (primary) hypertension: Secondary | ICD-10-CM

## 2016-12-15 DIAGNOSIS — G8929 Other chronic pain: Secondary | ICD-10-CM

## 2016-12-15 DIAGNOSIS — Z23 Encounter for immunization: Secondary | ICD-10-CM

## 2016-12-15 DIAGNOSIS — M160 Bilateral primary osteoarthritis of hip: Secondary | ICD-10-CM | POA: Diagnosis not present

## 2016-12-15 DIAGNOSIS — G894 Chronic pain syndrome: Secondary | ICD-10-CM

## 2016-12-15 DIAGNOSIS — M545 Low back pain: Secondary | ICD-10-CM

## 2016-12-15 DIAGNOSIS — M170 Bilateral primary osteoarthritis of knee: Secondary | ICD-10-CM | POA: Diagnosis not present

## 2016-12-15 LAB — BASIC METABOLIC PANEL
BUN: 16 mg/dL (ref 6–23)
CALCIUM: 9.2 mg/dL (ref 8.4–10.5)
CHLORIDE: 107 meq/L (ref 96–112)
CO2: 23 meq/L (ref 19–32)
CREATININE: 1.02 mg/dL (ref 0.40–1.20)
GFR: 73.31 mL/min (ref >60.00)
GLUCOSE: 93 mg/dL (ref 70–99)
Potassium: 3.9 mEq/L (ref 3.5–5.1)
Sodium: 137 mEq/L (ref 135–145)

## 2016-12-15 MED ORDER — HYDROCODONE-ACETAMINOPHEN 5-325 MG PO TABS: 2.0000 | ORAL_TABLET | Freq: Four times a day (QID) | ORAL | 0 refills | Status: DC | PRN

## 2016-12-15 MED ORDER — MELOXICAM 7.5 MG PO TABS: 7.5000 mg | ORAL_TABLET | Freq: Every day | ORAL | 1 refills | Status: DC

## 2016-12-15 NOTE — Progress Notes (Signed)
Subjective:  Patient ID: Denise Morgan, female    DOB: 1965/11/19  Age: 51 y.o. MRN: [id number]  CC: Hypertension and Osteoarthritis   HPI Kalyna Paolella presents for f/up - She complains of fatigue and arthritis pain that keeps her awake at night. Unfortunately she had been adding ibuprofen to meloxicam to try to  control the pain. Her blood pressure was not well controlled and her endocrinologist added a thiazide diuretic. Since then her blood pressure has been well controlled. She offers no other complaints today.  Outpatient Medications Prior to Visit  Medication Sig Dispense Refill  . albuterol (PROVENTIL HFA;VENTOLIN HFA) 108 (90 Base) MCG/ACT inhaler Inhale 2 puffs into the lungs every 4 (four) hours as needed for wheezing. 1 Inhaler 11  . amLODipine (NORVASC) 5 MG tablet TAKE 1 TABLET BY MOUTH EVERY DAY 30 tablet 0  . hydrochlorothiazide (MICROZIDE) 12.5 MG capsule Take 1 capsule (12.5 mg total) by mouth daily. 30 capsule 1  . methimazole (TAPAZOLE) 10 MG tablet Take 1 tablet (10 mg total) by mouth 2 (two) times daily. 60 tablet 3  . traZODone (DESYREL) 100 MG tablet Take 1 tablet (100 mg total) by mouth at bedtime. 90 tablet 3  . HYDROcodone-acetaminophen (NORCO/VICODIN) 5-325 MG tablet Take 2 tablets by mouth every 6 (six) hours as needed for moderate pain. 90 tablet 0  . meloxicam (MOBIC) 7.5 MG tablet Take 7.5 mg by mouth daily.    [name] ibuprofen (ADVIL,MOTRIN) 600 MG tablet Take 1 tablet (600 mg total) by mouth every 8 (eight) hours as needed. (Patient not taking: Reported on 12/02/2016) 90 tablet 3   No facility-administered medications prior to visit.     ROS Review of Systems  Constitutional: Positive for fatigue. Negative for appetite change, diaphoresis and unexpected weight change.  HENT: Negative.   Eyes: Negative for visual disturbance.  Respiratory: Negative.  Negative for cough, chest tightness, shortness of breath and wheezing.   Cardiovascular: Negative.   Negative for chest pain, palpitations and leg swelling.  Gastrointestinal: Negative.  Negative for abdominal pain, constipation, diarrhea, nausea and vomiting.  Endocrine: Negative.   Genitourinary: Negative.  Negative for difficulty urinating.  Musculoskeletal: Positive for arthralgias and back pain. Negative for myalgias and neck pain.  Skin: Negative.   Allergic/Immunologic: Negative.   Neurological: Negative.  Negative for dizziness, weakness, numbness and headaches.  Hematological: Negative for adenopathy. Does not bruise/bleed easily.  Psychiatric/Behavioral: Positive for sleep disturbance. Negative for dysphoric mood. The patient is not nervous/anxious.     Objective:  BP 124/84 (BP Location: Left Arm, Patient Position: Sitting, Cuff Size: Large)   Pulse 64   Temp 98.9 F (37.2 C) (Oral)   Resp 16   Ht 5\' 6"  (1.676 m)   Wt 260 lb 12 oz (118.3 kg)   LMP 03/01/2004   SpO2 97%   BMI 42.09 kg/m   BP Readings from Last 3 Encounters:  12/15/16 124/84  12/02/16 126/88  10/26/16 (!) 142/94    Wt Readings from Last 3 Encounters:  12/15/16 260 lb 12 oz (118.3 kg)  12/02/16 260 lb 12.8 oz (118.3 kg)  10/26/16 259 lb 9.6 oz (117.8 kg)    Physical Exam  Constitutional: She is oriented to person, place, and time. No distress.  HENT:  Mouth/Throat: Oropharynx is clear and moist. No oropharyngeal exudate.  Eyes: Conjunctivae are normal. Right eye exhibits no discharge. Left eye exhibits no discharge. No scleral icterus.  Neck: Normal range of motion. Neck supple. No JVD present. No thyromegaly  present.  Cardiovascular: Normal rate, regular rhythm and intact distal pulses.  Exam reveals no gallop and no friction rub.   No murmur heard. Pulmonary/Chest: Effort normal and breath sounds normal. No respiratory distress. She has no wheezes. She has no rales. She exhibits no tenderness.  Abdominal: Soft. Bowel sounds are normal. She exhibits no distension and no mass. There is no  tenderness. There is no rebound and no guarding.  Musculoskeletal: Normal range of motion. She exhibits no edema, tenderness or deformity.  Lymphadenopathy:    She has no cervical adenopathy.  Neurological: She is alert and oriented to person, place, and time.  Skin: Skin is warm and dry. No rash noted. She is not diaphoretic. No erythema. No pallor.  Vitals reviewed.   Lab Results  Component Value Date   WBC 4.3 04/21/2016   HGB 12.1 04/21/2016   HCT 35.9 (L) 04/21/2016   PLT 230.0 04/21/2016   GLUCOSE 93 12/15/2016   CHOL 178 04/21/2016   TRIG 187.0 (H) 04/21/2016   HDL 40.80 04/21/2016   LDLDIRECT 155.6 02/16/2013   LDLCALC 100 (H) 04/21/2016   ALT 16 04/07/2016   AST 18 04/07/2016   NA 137 12/15/2016   K 3.9 12/15/2016   CL 107 12/15/2016   CREATININE 1.02 12/15/2016   BUN 16 12/15/2016   CO2 23 12/15/2016   TSH 2.13 11/29/2016   INR 3.2 11/24/2012    Mm Digital Screening Bilateral  Result Date: 06/30/2015 CLINICAL DATA:  Screening. EXAM: DIGITAL SCREENING BILATERAL MAMMOGRAM WITH CAD COMPARISON:  Previous exam(s). ACR Breast Density Category b: There are scattered areas of fibroglandular density. FINDINGS: There are no findings suspicious for malignancy. Images were processed with CAD. IMPRESSION: No mammographic evidence of malignancy. A result letter of this screening mammogram will be mailed directly to the patient. RECOMMENDATION: Screening mammogram in one year. (Code:SM-B-01Y) BI-RADS CATEGORY  1: Negative. Electronically Signed   By: Lovey Newcomer M.D.   On: 07/01/2015 17:47    Assessment & Plan:   Ellarie was seen today for hypertension and osteoarthritis.  Diagnoses and all orders for this visit:  Essential hypertension- Her blood pressure is well controlled. Electrolytes and renal function are normal. -     Basic metabolic panel; Future  Need for influenza vaccination -     Flu Vaccine QUAD 36+ mos IM  Primary osteoarthritis of both hips- she was advised  not to take 2 anti-inflammatories at the same time. We will continue meloxicam and Norco as needed for pain. -     Discontinue: HYDROcodone-acetaminophen (NORCO/VICODIN) 5-325 MG tablet; Take 2 tablets by mouth every 6 (six) hours as needed for moderate pain. -     meloxicam (MOBIC) 7.5 MG tablet; Take 1 tablet (7.5 mg total) by mouth daily. -     Discontinue: HYDROcodone-acetaminophen (NORCO/VICODIN) 5-325 MG tablet; Take 2 tablets by mouth every 6 (six) hours as needed for moderate pain. -     HYDROcodone-acetaminophen (NORCO/VICODIN) 5-325 MG tablet; Take 2 tablets by mouth every 6 (six) hours as needed for moderate pain.  Primary osteoarthritis of both knees -     Discontinue: HYDROcodone-acetaminophen (NORCO/VICODIN) 5-325 MG tablet; Take 2 tablets by mouth every 6 (six) hours as needed for moderate pain. -     meloxicam (MOBIC) 7.5 MG tablet; Take 1 tablet (7.5 mg total) by mouth daily. -     Discontinue: HYDROcodone-acetaminophen (NORCO/VICODIN) 5-325 MG tablet; Take 2 tablets by mouth every 6 (six) hours as needed for moderate pain. -  HYDROcodone-acetaminophen (NORCO/VICODIN) 5-325 MG tablet; Take 2 tablets by mouth every 6 (six) hours as needed for moderate pain.  Chronic pain syndrome -     Discontinue: HYDROcodone-acetaminophen (NORCO/VICODIN) 5-325 MG tablet; Take 2 tablets by mouth every 6 (six) hours as needed for moderate pain. -     Discontinue: HYDROcodone-acetaminophen (NORCO/VICODIN) 5-325 MG tablet; Take 2 tablets by mouth every 6 (six) hours as needed for moderate pain. -     HYDROcodone-acetaminophen (NORCO/VICODIN) 5-325 MG tablet; Take 2 tablets by mouth every 6 (six) hours as needed for moderate pain.  Chronic bilateral low back pain without sciatica -     Discontinue: HYDROcodone-acetaminophen (NORCO/VICODIN) 5-325 MG tablet; Take 2 tablets by mouth every 6 (six) hours as needed for moderate pain. -     meloxicam (MOBIC) 7.5 MG tablet; Take 1 tablet (7.5 mg total) by  mouth daily. -     Discontinue: HYDROcodone-acetaminophen (NORCO/VICODIN) 5-325 MG tablet; Take 2 tablets by mouth every 6 (six) hours as needed for moderate pain. -     HYDROcodone-acetaminophen (NORCO/VICODIN) 5-325 MG tablet; Take 2 tablets by mouth every 6 (six) hours as needed for moderate pain.   I have discontinued Ms. Kail's ibuprofen. I have also changed her meloxicam. Additionally, I am having her maintain her albuterol, traZODone, methimazole, hydrochlorothiazide, amLODipine, and HYDROcodone-acetaminophen.  Meds ordered this encounter  Medications  . DISCONTD: HYDROcodone-acetaminophen (NORCO/VICODIN) 5-325 MG tablet    Sig: Take 2 tablets by mouth every 6 (six) hours as needed for moderate pain.    Dispense:  90 tablet    Refill:  0  . meloxicam (MOBIC) 7.5 MG tablet    Sig: Take 1 tablet (7.5 mg total) by mouth daily.    Dispense:  90 tablet    Refill:  1  . DISCONTD: HYDROcodone-acetaminophen (NORCO/VICODIN) 5-325 MG tablet    Sig: Take 2 tablets by mouth every 6 (six) hours as needed for moderate pain.    Dispense:  90 tablet    Refill:  0  . HYDROcodone-acetaminophen (NORCO/VICODIN) 5-325 MG tablet    Sig: Take 2 tablets by mouth every 6 (six) hours as needed for moderate pain.    Dispense:  90 tablet    Refill:  0     Follow-up: Return in about 6 months (around 06/15/2017).  Scarlette Calico, MD

## 2016-12-15 NOTE — Patient Instructions (Signed)

## 2016-12-16 ENCOUNTER — Encounter: Payer: Self-pay | Admitting: Internal Medicine

## 2016-12-29 ENCOUNTER — Other Ambulatory Visit: Payer: Self-pay | Admitting: Endocrinology

## 2017-01-12 ENCOUNTER — Other Ambulatory Visit (INDEPENDENT_AMBULATORY_CARE_PROVIDER_SITE_OTHER): Payer: BLUE CROSS/BLUE SHIELD

## 2017-01-12 DIAGNOSIS — E0590 Thyrotoxicosis, unspecified without thyrotoxic crisis or storm: Secondary | ICD-10-CM | POA: Diagnosis not present

## 2017-01-12 LAB — TSH: TSH: 1.23 u[IU]/mL (ref 0.35–4.50)

## 2017-01-12 LAB — T4, FREE: FREE T4: 0.84 ng/dL (ref 0.60–1.60)

## 2017-01-14 ENCOUNTER — Other Ambulatory Visit: Payer: BLUE CROSS/BLUE SHIELD

## 2017-01-18 ENCOUNTER — Encounter: Payer: BLUE CROSS/BLUE SHIELD | Admitting: Endocrinology

## 2017-01-18 NOTE — Progress Notes (Signed)
This encounter was created in error - please disregard.

## 2017-01-25 ENCOUNTER — Ambulatory Visit: Payer: BLUE CROSS/BLUE SHIELD | Admitting: Endocrinology

## 2017-01-25 ENCOUNTER — Encounter: Payer: Self-pay | Admitting: Endocrinology

## 2017-01-25 VITALS — BP 130/82 | HR 67 | Ht 66.0 in | Wt 257.0 lb

## 2017-01-25 DIAGNOSIS — E0590 Thyrotoxicosis, unspecified without thyrotoxic crisis or storm: Secondary | ICD-10-CM | POA: Diagnosis not present

## 2017-01-25 MED ORDER — METHIMAZOLE 10 MG PO TABS: ORAL_TABLET | ORAL | 3 refills | Status: DC

## 2017-01-25 NOTE — Progress Notes (Signed)
Patient ID: Denise Morgan, female   DOB: 29-Aug-1965, 51 y.o.   MRN: [id number]                                                                                                               Reason for Appointment:  Hyperthyroidism, follow-up  Referring physician: Scarlette Calico   Chief complaint: Tiredness   History of Present Illness:   Background information obtained on initial consultation: For the last several months the patient has been complaining about feeling tired. However last year she was having a rapid heartbeat and palpitations which went on for several weeks and these improved more recently She apparently had lost about 30 pounds last year starting in October although she was supposedly trying to lose weight  She also has had some persistent symptoms of feeling shaky at times  She has had hot flashes and sweating for several years and this may be more prominent more recently She does not complain of increased bowel movements  RECENT history: The patient was confirmed to have Graves' disease with high thyrotropin receptor antibody in 5/18 She started taking methimazole and with this her symptoms above improved Her dose was increased up to 20 mg a day because of a higher T3 level in 5/18  Subsequently her methimazole dose has been progressively lower based on her thyroid functions Since 7/18 has been taking 10 mg daily and since 10/18 has been taking 10 mg alternating with 5 mg  She again feels fairly good, however recently has been feeling more tired but she thinks this is from not getting enough rest for her activity level Her weight is slightly lower No cold intolerance, no shakiness  Her free T4 is appearing to be plateaued and TSH is again normal at 1.2  Most recent thyrotropin receptor antibody was still above normal in August  Wt Readings from Last 3 Encounters:  01/25/17 257 lb (116.6 kg)  12/15/16 260 lb 12 oz (118.3 kg)  12/02/16 260 lb 12.8 oz (118.3 kg)      Thyroid function tests as follows:     Lab Results  Component Value Date   FREET4 0.84 01/12/2017   FREET4 0.74 11/29/2016   FREET4 0.81 10/22/2016   T3FREE 2.9 11/29/2016   T3FREE 5.0 (H) 07/27/2016   T3FREE 6.5 (H) 06/30/2016   TSH 1.23 01/12/2017   TSH 2.13 11/29/2016   TSH 0.04 (L) 10/22/2016    Lab Results  Component Value Date   THYROTRECAB 3.58 (H) 10/22/2016   THYROTRECAB 4.43 (H) 06/30/2016     Allergies as of 01/25/2017      Reactions   Latex Itching, Rash      Medication List        Accurate as of 01/25/17 10:46 AM. Always use your most recent med list.          albuterol 108 (90 Base) MCG/ACT inhaler Commonly known as:  PROVENTIL HFA;VENTOLIN HFA Inhale 2 puffs into the lungs every 4 (four) hours as needed for wheezing.   amLODipine 5  MG tablet Commonly known as:  NORVASC TAKE 1 TABLET BY MOUTH EVERY DAY   hydrochlorothiazide 12.5 MG capsule Commonly known as:  MICROZIDE Take 1 capsule (12.5 mg total) by mouth daily.   HYDROcodone-acetaminophen 5-325 MG tablet Commonly known as:  NORCO/VICODIN Take 2 tablets by mouth every 6 (six) hours as needed for moderate pain.   meloxicam 7.5 MG tablet Commonly known as:  MOBIC Take 1 tablet (7.5 mg total) by mouth daily.   methimazole 10 MG tablet Commonly known as:  TAPAZOLE Take 1 tablet (10 mg total) by mouth 2 (two) times daily.   traZODone 100 MG tablet Commonly known as:  DESYREL Take 1 tablet (100 mg total) by mouth at bedtime.           Past Medical History:  Diagnosis Date  . Allergy   . Anemia   . Arthritis   . Asthma   . Blood transfusion without reported diagnosis   . Depression   . Frequent headaches   . History of phlebitis   . Hx of adenomatous polyp of colon 08/15/2015  . Palpitations    heart monitor all normal per pt.2017  . PE (pulmonary embolism)     Past Surgical History:  Procedure Laterality Date  . ABDOMINAL HYSTERECTOMY  03/01/2004  . CHOLECYSTECTOMY       Family History  Problem Relation Age of Onset  . Colon polyps Mother   . Ovarian cancer Mother   . Arthritis Mother   . Colon cancer Maternal Aunt   . Esophageal cancer Neg Hx   . Rectal cancer Neg Hx   . Stomach cancer Neg Hx     Social History:  reports that she quit smoking about 17 months ago. Her smoking use included cigarettes. She has a 15.00 pack-year smoking history. she has never used smokeless tobacco. She reports that she drinks about 4.2 oz of alcohol per week. She reports that she does not use drugs.  Allergies:  Allergies  Allergen Reactions  . Latex Itching and Rash     Review of Systems  She was started on HCTZ in addition to her amlodipine 5 mg daily in 10/18 and her blood pressure appears to be better now   BP Readings from Last 3 Encounters:  01/25/17 130/82  12/15/16 124/84  12/02/16 126/88       Examination:   BP 130/82   Pulse 67   Ht 5\' 6"  (1.676 m)   Wt 257 lb (116.6 kg)   LMP 03/01/2004   SpO2 96%   BMI 41.48 kg/m   Exam not indicated   Assessment/Plan:   Hyperthyroidism, from Graves' disease diagnosed in 2/18   She has been on methimazole out since about 5/18 With her dose of 10 mg methimazole alternating with 5 mg her free T4 level appears to be plateaued and fairly normal along with TSH However since it is does not decreasing any further and her TSH may be slightly lower than the last time will not attempt to reduce her dose at this time Will check thyrotropin receptor antibody on the next visit to assess her progress Discussed possibility of I-131 treatment if her methimazole is not able to get her into remission within a year of treatment, have discussed how this would work and the consequence of hypothyroidism HYPERTENSION: Better controlled and she will follow-up with PCP    Total visit time 15 minutes  Johann Santone 01/25/2017, 10:46 AM     Note: This office note was prepared  with Teacher, early years/pre. Any transcriptional errors that result from this process are unintentional.

## 2017-02-02 ENCOUNTER — Other Ambulatory Visit: Payer: Self-pay | Admitting: Endocrinology

## 2017-03-08 ENCOUNTER — Other Ambulatory Visit: Payer: Self-pay | Admitting: Endocrinology

## 2017-03-09 ENCOUNTER — Telehealth: Payer: Self-pay | Admitting: Internal Medicine

## 2017-03-09 NOTE — Telephone Encounter (Signed)
Copied from [place] [id number]. Topic: Quick Communication - See Telephone Encounter >> Mar 09, 2017 11:36 AM Cleaster Corin, NT wrote: CRM for notification. See Telephone encounter for:   03/09/17. Pt. Has an abscess tooth wanted to know is she can get an rx. For antibiotic until she gets an appt. To the dentist. Pt. Can be reached at [phone]  [id number] [address], [place] [address] Phone: [phone] Fax: [phone]

## 2017-03-10 ENCOUNTER — Other Ambulatory Visit: Payer: Self-pay | Admitting: Internal Medicine

## 2017-03-10 DIAGNOSIS — K047 Periapical abscess without sinus: Secondary | ICD-10-CM | POA: Insufficient documentation

## 2017-03-10 MED ORDER — AMOXICILLIN-POT CLAVULANATE 875-125 MG PO TABS
1.0000 | ORAL_TABLET | Freq: Two times a day (BID) | ORAL | 0 refills | Status: AC
Start: 2017-03-10 — End: 2017-03-20

## 2017-03-10 NOTE — Telephone Encounter (Signed)
Notified pt MD sent antibiotic to [facility]...[name]

## 2017-03-10 NOTE — Telephone Encounter (Signed)
done

## 2017-03-10 NOTE — Telephone Encounter (Signed)
pls advise msg below.Marland KitchenJohny Morgan

## 2017-03-16 ENCOUNTER — Other Ambulatory Visit (INDEPENDENT_AMBULATORY_CARE_PROVIDER_SITE_OTHER): Payer: BLUE CROSS/BLUE SHIELD

## 2017-03-16 DIAGNOSIS — E0590 Thyrotoxicosis, unspecified without thyrotoxic crisis or storm: Secondary | ICD-10-CM

## 2017-03-16 LAB — T4, FREE: FREE T4: 0.85 ng/dL (ref 0.60–1.60)

## 2017-03-16 LAB — TSH: TSH: 0.91 u[IU]/mL (ref 0.35–4.50)

## 2017-03-17 LAB — THYROTROPIN RECEPTOR AUTOABS: Thyrotropin Receptor Ab: 1.92 IU/L — ABNORMAL HIGH (ref 0.00–1.75)

## 2017-03-18 ENCOUNTER — Other Ambulatory Visit: Payer: BLUE CROSS/BLUE SHIELD

## 2017-03-23 ENCOUNTER — Encounter: Payer: Self-pay | Admitting: Endocrinology

## 2017-03-23 ENCOUNTER — Ambulatory Visit: Payer: BLUE CROSS/BLUE SHIELD | Admitting: Endocrinology

## 2017-03-23 ENCOUNTER — Ambulatory Visit (INDEPENDENT_AMBULATORY_CARE_PROVIDER_SITE_OTHER): Payer: BLUE CROSS/BLUE SHIELD | Admitting: Endocrinology

## 2017-03-23 VITALS — BP 130/86 | HR 65 | Wt 261.0 lb

## 2017-03-23 DIAGNOSIS — E0590 Thyrotoxicosis, unspecified without thyrotoxic crisis or storm: Secondary | ICD-10-CM

## 2017-03-23 NOTE — Progress Notes (Signed)
Patient ID: Denise Morgan, female   DOB: 07-30-1965, 52 y.o.   MRN: [id number]                                                                                                               Reason for Appointment:  Hyperthyroidism, follow-up  Referring physician: Scarlette Calico   Chief complaint: Tiredness   History of Present Illness:   Background information obtained on initial consultation: For the last several months the patient has been complaining about feeling tired. However last year she was having a rapid heartbeat and palpitations which went on for several weeks and these improved more recently She apparently had lost about 30 pounds last year starting in October although she was supposedly trying to lose weight  She also has had some persistent symptoms of feeling shaky at times  She has had hot flashes and sweating for several years and this may be more prominent more recently She does not complain of increased bowel movements  RECENT history: The patient was confirmed to have Graves' disease with high thyrotropin receptor antibody in 5/18 She started taking methimazole and with this her symptoms above improved  Her dose has been adjusted, initially taking 20 mg Since 7/18 had been taking 10 mg daily and since 10/18 has been taking 10 mg alternating with 5 mg  She does not complain of any unusual fatigue except the last 3-4 days for unrelated reasons No palpitations or shakiness She is regular with taking her medication  Her free T4 is appearing to be plateaued and TSH is again normal   Most recent thyrotropin receptor antibody is improved although still slightly above normal   Wt Readings from Last 3 Encounters:  03/23/17 261 lb (118.4 kg)  01/25/17 257 lb (116.6 kg)  12/15/16 260 lb 12 oz (118.3 kg)     Thyroid function tests as follows:     Lab Results  Component Value Date   FREET4 0.85 03/16/2017   FREET4 0.84 01/12/2017   FREET4 0.74 11/29/2016   T3FREE 2.9 11/29/2016   T3FREE 5.0 (H) 07/27/2016   T3FREE 6.5 (H) 06/30/2016   TSH 0.91 03/16/2017   TSH 1.23 01/12/2017   TSH 2.13 11/29/2016    Lab Results  Component Value Date   THYROTRECAB 1.92 (H) 03/16/2017   THYROTRECAB 3.58 (H) 10/22/2016   THYROTRECAB 4.43 (H) 06/30/2016     Allergies as of 03/23/2017      Reactions   Latex Itching, Rash      Medication List        Accurate as of 03/23/17  4:04 PM. Always use your most recent med list.          albuterol 108 (90 Base) MCG/ACT inhaler Commonly known as:  PROVENTIL HFA;VENTOLIN HFA Inhale 2 puffs into the lungs every 4 (four) hours as needed for wheezing.   amLODipine 5 MG tablet Commonly known as:  NORVASC TAKE 1 TABLET BY MOUTH EVERY DAY   hydrochlorothiazide 12.5 MG capsule Commonly known as:  MICROZIDE Take 1  capsule (12.5 mg total) by mouth daily.   HYDROcodone-acetaminophen 5-325 MG tablet Commonly known as:  NORCO/VICODIN Take 2 tablets by mouth every 6 (six) hours as needed for moderate pain.   meloxicam 7.5 MG tablet Commonly known as:  MOBIC Take 1 tablet (7.5 mg total) by mouth daily.   methimazole 10 MG tablet Commonly known as:  TAPAZOLE 1 tablet alternating with half tablet   traZODone 100 MG tablet Commonly known as:  DESYREL Take 1 tablet (100 mg total) by mouth at bedtime.           Past Medical History:  Diagnosis Date  . Allergy   . Anemia   . Arthritis   . Asthma   . Blood transfusion without reported diagnosis   . Depression   . Frequent headaches   . History of phlebitis   . Hx of adenomatous polyp of colon 08/15/2015  . Palpitations    heart monitor all normal per pt.2017  . PE (pulmonary embolism)     Past Surgical History:  Procedure Laterality Date  . ABDOMINAL HYSTERECTOMY  03/01/2004  . CHOLECYSTECTOMY      Family History  Problem Relation Age of Onset  . Colon polyps Mother   . Ovarian cancer Mother   . Arthritis Mother   . Colon cancer Maternal  Aunt   . Esophageal cancer Neg Hx   . Rectal cancer Neg Hx   . Stomach cancer Neg Hx     Social History:  reports that she quit smoking about 19 months ago. Her smoking use included cigarettes. She has a 15.00 pack-year smoking history. she has never used smokeless tobacco. She reports that she drinks about 4.2 oz of alcohol per week. She reports that she does not use drugs.  Allergies:  Allergies  Allergen Reactions  . Latex Itching and Rash     Review of Systems  She was started on HCTZ in addition to her amlodipine 5 mg daily in 10/18 Blood pressure also followed by PCP  BP Readings from Last 3 Encounters:  03/23/17 130/86  01/25/17 130/82  12/15/16 124/84       Examination:   BP 130/86   Pulse 65   Wt 261 lb (118.4 kg)   LMP 03/01/2004   SpO2 98%   BMI 42.13 kg/m   No prominence of the eyes Thyroid not enlarged No tremor Skin temperature appears normal   Assessment/Plan:   Hyperthyroidism, from Graves' disease diagnosed in 2/18   She has been on methimazole out since about 5/18 With her dose of 10 mg methimazole alternating with 5 mg her free T4 level is the same as on the last visit TSH is also normal an relatively stable She is doing subjectively well  Her thyrotropin receptor antibody is improved compared to 09/2016 indicating that she may be able to get into remission in the next few months  This was explained to her Otherwise she can continue taking 10 mg methimazole alternating with 5 mg for the next 2 months   HYPERTENSION: Fairly well controlled and she will follow-up with PCP, blood pressure slightly higher today because of feeling stressed    Total visit time 15 minutes  Elayne Snare 03/23/2017, 4:04 PM     Note: This office note was prepared with Dragon voice recognition system technology. Any transcriptional errors that result from this process are unintentional.

## 2017-03-31 ENCOUNTER — Encounter: Payer: Self-pay | Admitting: Internal Medicine

## 2017-03-31 ENCOUNTER — Telehealth: Payer: Self-pay | Admitting: Internal Medicine

## 2017-03-31 ENCOUNTER — Ambulatory Visit: Payer: BLUE CROSS/BLUE SHIELD | Admitting: Internal Medicine

## 2017-03-31 ENCOUNTER — Other Ambulatory Visit (INDEPENDENT_AMBULATORY_CARE_PROVIDER_SITE_OTHER): Payer: BLUE CROSS/BLUE SHIELD

## 2017-03-31 VITALS — BP 138/84 | HR 78 | Temp 98.1°F | Resp 16 | Ht 66.0 in | Wt 259.5 lb

## 2017-03-31 DIAGNOSIS — G8929 Other chronic pain: Secondary | ICD-10-CM | POA: Diagnosis not present

## 2017-03-31 DIAGNOSIS — E785 Hyperlipidemia, unspecified: Secondary | ICD-10-CM

## 2017-03-31 DIAGNOSIS — M160 Bilateral primary osteoarthritis of hip: Secondary | ICD-10-CM

## 2017-03-31 DIAGNOSIS — M545 Low back pain, unspecified: Secondary | ICD-10-CM

## 2017-03-31 DIAGNOSIS — G894 Chronic pain syndrome: Secondary | ICD-10-CM

## 2017-03-31 DIAGNOSIS — I10 Essential (primary) hypertension: Secondary | ICD-10-CM

## 2017-03-31 DIAGNOSIS — M170 Bilateral primary osteoarthritis of knee: Secondary | ICD-10-CM

## 2017-03-31 LAB — COMPREHENSIVE METABOLIC PANEL
ALK PHOS: 90 U/L (ref 39–117)
ALT: 14 U/L (ref 0–35)
AST: 13 U/L (ref 0–37)
Albumin: 4 g/dL (ref 3.5–5.2)
BUN: 12 mg/dL (ref 6–23)
CALCIUM: 9.2 mg/dL (ref 8.4–10.5)
CHLORIDE: 105 meq/L (ref 96–112)
CO2: 26 mEq/L (ref 19–32)
CREATININE: 0.63 mg/dL (ref 0.40–1.20)
GFR: 127.7 mL/min (ref >60.00)
Glucose, Bld: 93 mg/dL (ref 70–99)
POTASSIUM: 3.7 meq/L (ref 3.5–5.1)
Sodium: 138 mEq/L (ref 135–145)
TOTAL PROTEIN: 7.5 g/dL (ref 6.0–8.3)
Total Bilirubin: 0.5 mg/dL (ref 0.2–1.2)

## 2017-03-31 LAB — CBC WITH DIFFERENTIAL/PLATELET
BASOS PCT: 0.9 % (ref 0.0–3.0)
Basophils Absolute: 0 10*3/uL (ref 0.0–0.1)
EOS PCT: 2.5 % (ref 0.0–5.0)
Eosinophils Absolute: 0.1 10*3/uL (ref 0.0–0.7)
HEMATOCRIT: 40.3 % (ref 36.0–46.0)
HEMOGLOBIN: 13.8 g/dL (ref 12.0–15.0)
LYMPHS PCT: 49 % — AB (ref 12.0–46.0)
Lymphs Abs: 2.5 10*3/uL (ref 0.7–4.0)
MCHC: 34.2 g/dL (ref 30.0–36.0)
MCV: 97.5 fl (ref 78.0–100.0)
MONOS PCT: 5.7 % (ref 3.0–12.0)
Monocytes Absolute: 0.3 10*3/uL (ref 0.1–1.0)
Neutro Abs: 2.2 10*3/uL (ref 1.4–7.7)
Neutrophils Relative %: 41.9 % — ABNORMAL LOW (ref 43.0–77.0)
Platelets: 272 10*3/uL (ref 150.0–400.0)
RBC: 4.13 Mil/uL (ref 3.87–5.11)
RDW: 13.8 % (ref 11.5–15.5)
WBC: 5.2 10*3/uL (ref 4.0–10.5)

## 2017-03-31 LAB — LIPID PANEL
Cholesterol: 202 mg/dL — ABNORMAL HIGH (ref 0–200)
HDL: 48.3 mg/dL (ref >39.00)
LDL Cholesterol: 140 mg/dL — ABNORMAL HIGH (ref 0–99)
NONHDL: 153.37
TRIGLYCERIDES: 68 mg/dL (ref 0.0–149.0)
Total CHOL/HDL Ratio: 4
VLDL: 13.6 mg/dL (ref 0.0–40.0)

## 2017-03-31 MED ORDER — AMLODIPINE BESYLATE 5 MG PO TABS: 5.0000 mg | ORAL_TABLET | Freq: Every day | ORAL | 1 refills | Status: DC

## 2017-03-31 MED ORDER — HYDROCODONE-ACETAMINOPHEN 5-325 MG PO TABS: 2.0000 | ORAL_TABLET | Freq: Four times a day (QID) | ORAL | 0 refills | Status: DC | PRN

## 2017-03-31 MED ORDER — HYDROCHLOROTHIAZIDE 12.5 MG PO CAPS: 12.5000 mg | ORAL_CAPSULE | Freq: Every day | ORAL | 1 refills | Status: DC

## 2017-03-31 NOTE — Patient Instructions (Signed)

## 2017-03-31 NOTE — Telephone Encounter (Signed)
Copied from [place]. Topic: Quick Communication - See Telephone Encounter >> Mar 31, 2017  4:35 PM Bea Graff, NT wrote: CRM for notification. See Telephone encounter for: [facility] [address] [facility] and told pt to call office and have the dr to back out that request to the pharmacy and [facility] [address] Pt also states usually she gets 3 refills on this rx and the pharmacy told her that he only sent in the rx with 0 refills. Please let pt know when rx had been sent to different pharmacy.  03/31/17.

## 2017-03-31 NOTE — Telephone Encounter (Signed)
Pt called and needs a refill of her HYDROCODONE, went to the pharmacy today and it was not there  Please advise

## 2017-03-31 NOTE — Progress Notes (Signed)
Subjective:  Patient ID: Denise Morgan, female    DOB: 1965/09/28  Age: 52 y.o. MRN: [id number]  CC: Hypertension; Hyperlipidemia; and Osteoarthritis   HPI Denise Morgan presents for f/up -she complains of chronic pain but otherwise feels well and offers no new complaints.  She tells me her blood pressure has been well controlled and she has had no recent episodes of chest pain, shortness of breath, palpitations, headache, or blurred vision.  Outpatient Medications Prior to Visit  Medication Sig Dispense Refill  . albuterol (PROVENTIL HFA;VENTOLIN HFA) 108 (90 Base) MCG/ACT inhaler Inhale 2 puffs into the lungs every 4 (four) hours as needed for wheezing. 1 Inhaler 11  . meloxicam (MOBIC) 7.5 MG tablet Take 1 tablet (7.5 mg total) by mouth daily. 90 tablet 1  . methimazole (TAPAZOLE) 10 MG tablet 1 tablet alternating with half tablet 45 tablet 3  . traZODone (DESYREL) 100 MG tablet Take 1 tablet (100 mg total) by mouth at bedtime. 90 tablet 3  . amLODipine (NORVASC) 5 MG tablet TAKE 1 TABLET BY MOUTH EVERY DAY 30 tablet 0  . hydrochlorothiazide (MICROZIDE) 12.5 MG capsule Take 1 capsule (12.5 mg total) by mouth daily. 30 capsule 1  . HYDROcodone-acetaminophen (NORCO/VICODIN) 5-325 MG tablet Take 2 tablets by mouth every 6 (six) hours as needed for moderate pain. 90 tablet 0   No facility-administered medications prior to visit.     ROS Review of Systems  Constitutional: Negative for diaphoresis, fatigue and unexpected weight change.  HENT: Negative.  Negative for sore throat and trouble swallowing.   Eyes: Negative for visual disturbance.  Respiratory: Negative for cough, chest tightness, shortness of breath and wheezing.   Cardiovascular: Negative for chest pain, palpitations and leg swelling.  Gastrointestinal: Negative for abdominal pain, constipation, diarrhea, nausea and vomiting.  Genitourinary: Negative.  Negative for difficulty urinating.  Musculoskeletal: Positive for  arthralgias and back pain. Negative for myalgias and neck pain.  Skin: Negative for color change and rash.  Allergic/Immunologic: Negative.   Neurological: Negative.  Negative for dizziness, weakness and headaches.  Hematological: Negative for adenopathy. Does not bruise/bleed easily.  Psychiatric/Behavioral: Negative.     Objective:  BP 138/84 (BP Location: Left Arm, Patient Position: Sitting, Cuff Size: Large)   Pulse 78   Temp 98.1 F (36.7 C) (Oral)   Resp 16   Ht 5\' 6"  (1.676 m)   Wt 259 lb 8 oz (117.7 kg)   LMP 03/01/2004   SpO2 99%   BMI 41.88 kg/m   BP Readings from Last 3 Encounters:  03/31/17 138/84  03/23/17 130/86  01/25/17 130/82    Wt Readings from Last 3 Encounters:  03/31/17 259 lb 8 oz (117.7 kg)  03/23/17 261 lb (118.4 kg)  01/25/17 257 lb (116.6 kg)    Physical Exam  Constitutional: She is oriented to person, place, and time. No distress.  HENT:  Mouth/Throat: Oropharynx is clear and moist. No oropharyngeal exudate.  Eyes: Conjunctivae are normal. Left eye exhibits no discharge. No scleral icterus.  Neck: Normal range of motion. Neck supple. No JVD present. No thyromegaly present.  Cardiovascular: Normal rate, regular rhythm and normal heart sounds. Exam reveals no gallop.  No murmur heard. Pulmonary/Chest: Effort normal and breath sounds normal. No respiratory distress. She has no wheezes. She has no rales.  Abdominal: Soft. She exhibits no distension and no mass. There is no tenderness. There is no guarding.  Musculoskeletal: Normal range of motion. She exhibits no edema or tenderness.  Lymphadenopathy:  She has no cervical adenopathy.  Neurological: She is alert and oriented to person, place, and time.  Skin: Skin is warm and dry. No rash noted. She is not diaphoretic. No erythema. No pallor.  Vitals reviewed.   Lab Results  Component Value Date   WBC 5.2 03/31/2017   HGB 13.8 03/31/2017   HCT 40.3 03/31/2017   PLT 272.0 03/31/2017    GLUCOSE 93 03/31/2017   CHOL 202 (H) 03/31/2017   TRIG 68.0 03/31/2017   HDL 48.30 03/31/2017   LDLDIRECT 155.6 02/16/2013   LDLCALC 140 (H) 03/31/2017   ALT 14 03/31/2017   AST 13 03/31/2017   NA 138 03/31/2017   K 3.7 03/31/2017   CL 105 03/31/2017   CREATININE 0.63 03/31/2017   BUN 12 03/31/2017   CO2 26 03/31/2017   TSH 0.91 03/16/2017   INR 3.2 11/24/2012    Mm Digital Screening Bilateral  Result Date: 06/30/2015 CLINICAL DATA:  Screening. EXAM: DIGITAL SCREENING BILATERAL MAMMOGRAM WITH CAD COMPARISON:  Previous exam(s). ACR Breast Density Category b: There are scattered areas of fibroglandular density. FINDINGS: There are no findings suspicious for malignancy. Images were processed with CAD. IMPRESSION: No mammographic evidence of malignancy. A result letter of this screening mammogram will be mailed directly to the patient. RECOMMENDATION: Screening mammogram in one year. (Code:SM-B-01Y) BI-RADS CATEGORY  1: Negative. Electronically Signed   By: Lovey Newcomer M.D.   On: 07/01/2015 17:47    Assessment & Plan:   Denise Morgan was seen today for hypertension, hyperlipidemia and osteoarthritis.  Diagnoses and all orders for this visit:  Essential hypertension- Her blood pressure is well controlled.  Electrolytes and renal function are normal.  Will continue the combination of amlodipine and hydrochlorothiazide. -     Comprehensive metabolic panel; Future -     CBC with Differential/Platelet; Future -     hydrochlorothiazide (MICROZIDE) 12.5 MG capsule; Take 1 capsule (12.5 mg total) by mouth daily. -     amLODipine (NORVASC) 5 MG tablet; Take 1 tablet (5 mg total) by mouth daily.  Hyperlipidemia with target LDL less than 130- Her ASCVD risk score is only 5.9% so I do not recommend a statin for CV risk reduction. -     Lipid panel; Future  Primary osteoarthritis of both knees -     HYDROcodone-acetaminophen (NORCO/VICODIN) 5-325 MG tablet; Take 2 tablets by mouth every 6 (six) hours  as needed for moderate pain.  Chronic pain syndrome -     HYDROcodone-acetaminophen (NORCO/VICODIN) 5-325 MG tablet; Take 2 tablets by mouth every 6 (six) hours as needed for moderate pain.  Primary osteoarthritis of both hips -     HYDROcodone-acetaminophen (NORCO/VICODIN) 5-325 MG tablet; Take 2 tablets by mouth every 6 (six) hours as needed for moderate pain.  Chronic bilateral low back pain without sciatica -     HYDROcodone-acetaminophen (NORCO/VICODIN) 5-325 MG tablet; Take 2 tablets by mouth every 6 (six) hours as needed for moderate pain.   I have changed Denise Morgan's amLODipine. I am also having her maintain her albuterol, traZODone, meloxicam, methimazole, hydrochlorothiazide, and HYDROcodone-acetaminophen.  Meds ordered this encounter  Medications  . hydrochlorothiazide (MICROZIDE) 12.5 MG capsule    Sig: Take 1 capsule (12.5 mg total) by mouth daily.    Dispense:  90 capsule    Refill:  1  . amLODipine (NORVASC) 5 MG tablet    Sig: Take 1 tablet (5 mg total) by mouth daily.    Dispense:  90 tablet    Refill:  1  . HYDROcodone-acetaminophen (NORCO/VICODIN) 5-325 MG tablet    Sig: Take 2 tablets by mouth every 6 (six) hours as needed for moderate pain.    Dispense:  90 tablet    Refill:  0     Follow-up: Return in about 6 months (around 09/28/2017).  Scarlette Calico, MD

## 2017-03-31 NOTE — Telephone Encounter (Signed)
Seen today 03/31/17 in office  Routing to provider

## 2017-04-01 ENCOUNTER — Encounter: Payer: Self-pay | Admitting: Internal Medicine

## 2017-04-04 ENCOUNTER — Encounter: Payer: Self-pay | Admitting: Internal Medicine

## 2017-04-05 MED ORDER — HYDROCODONE-ACETAMINOPHEN 5-325 MG PO TABS: 2.0000 | ORAL_TABLET | Freq: Four times a day (QID) | ORAL | 0 refills | Status: DC | PRN

## 2017-04-05 NOTE — Addendum Note (Signed)
Addended by: Rosemarie Ax on: 04/05/2017 03:21 PM   Modules accepted: Orders

## 2017-04-05 NOTE — Telephone Encounter (Signed)
Patient is calling upset about this. Can we please send the medication to the pharmacy she is asking.    [id number] [address], [place] [phone] (Phone) [phone] (Fax)

## 2017-04-05 NOTE — Telephone Encounter (Signed)
[facility] [address] stated that the hydrocodone is on back order and the only two [facility] are [facility] and [facility].   Contact patient and she is requesting to pick up script.   Previous Hydrocodone rx has been canceled.

## 2017-04-05 NOTE — Telephone Encounter (Signed)
Pt states she would like a call back when this is done bc she has been trying to get this taking care since the 1/31.

## 2017-04-05 NOTE — Telephone Encounter (Signed)
Hydrocodone is on back order at patients pharmacy. Will print rx so she can fill where supply is available.   Rosemarie Ax, MD [facility] 04/05/2017, 3:20 PM

## 2017-05-07 ENCOUNTER — Other Ambulatory Visit: Payer: Self-pay | Admitting: Internal Medicine

## 2017-05-07 DIAGNOSIS — M160 Bilateral primary osteoarthritis of hip: Secondary | ICD-10-CM

## 2017-05-07 DIAGNOSIS — M170 Bilateral primary osteoarthritis of knee: Secondary | ICD-10-CM

## 2017-05-07 DIAGNOSIS — M545 Low back pain: Secondary | ICD-10-CM

## 2017-05-07 DIAGNOSIS — G8929 Other chronic pain: Secondary | ICD-10-CM

## 2017-05-09 ENCOUNTER — Other Ambulatory Visit: Payer: Self-pay | Admitting: Internal Medicine

## 2017-05-09 DIAGNOSIS — M545 Low back pain, unspecified: Secondary | ICD-10-CM

## 2017-05-09 DIAGNOSIS — G8929 Other chronic pain: Secondary | ICD-10-CM

## 2017-05-09 DIAGNOSIS — M170 Bilateral primary osteoarthritis of knee: Secondary | ICD-10-CM

## 2017-05-09 DIAGNOSIS — M160 Bilateral primary osteoarthritis of hip: Secondary | ICD-10-CM

## 2017-05-09 DIAGNOSIS — G894 Chronic pain syndrome: Secondary | ICD-10-CM

## 2017-05-09 NOTE — Telephone Encounter (Signed)
Pt requesting refill Hydrocodone-Acetaminophen which was not previously filled by PCP.   Prescription for Meloxicam was filled on 05/07/17 and sent to requested pharmacy.   LOV: 03/31/17  Dr[facility] Ronnald Ramp  [facility] [address]

## 2017-05-09 NOTE — Telephone Encounter (Signed)
Copied from [place] [id number]. Topic: Quick Communication - Rx Refill/Question >> May 09, 2017  2:22 PM Celedonio Savage L wrote: Medication: HYDROcodone-acetaminophen (NORCO/VICODIN) 5-325 MG tablet   meloxicam (MOBIC) 7.5 MG tablet     Has the patient contacted their pharmacy? Yes.     (Agent: If no, request that the patient contact the pharmacy for the refill.)   Preferred Pharmacy (with phone number or street name): [id number] [address], [place] [phone] (Phone) [phone] (Fax)     Agent: Please be advised that RX refills may take up to 3 business days. We ask that you follow-up with your pharmacy.

## 2017-05-10 MED ORDER — HYDROCODONE-ACETAMINOPHEN 5-325 MG PO TABS: 2.0000 | ORAL_TABLET | Freq: Four times a day (QID) | ORAL | 0 refills | Status: DC | PRN

## 2017-05-30 ENCOUNTER — Other Ambulatory Visit (INDEPENDENT_AMBULATORY_CARE_PROVIDER_SITE_OTHER): Payer: BLUE CROSS/BLUE SHIELD

## 2017-05-30 ENCOUNTER — Encounter: Payer: Self-pay | Admitting: Internal Medicine

## 2017-05-30 ENCOUNTER — Ambulatory Visit (INDEPENDENT_AMBULATORY_CARE_PROVIDER_SITE_OTHER): Payer: BLUE CROSS/BLUE SHIELD | Admitting: Internal Medicine

## 2017-05-30 VITALS — BP 138/82 | HR 82 | Temp 98.4°F | Resp 16 | Ht 66.0 in | Wt 262.2 lb

## 2017-05-30 DIAGNOSIS — Z0000 Encounter for general adult medical examination without abnormal findings: Secondary | ICD-10-CM

## 2017-05-30 DIAGNOSIS — I10 Essential (primary) hypertension: Secondary | ICD-10-CM

## 2017-05-30 DIAGNOSIS — E785 Hyperlipidemia, unspecified: Secondary | ICD-10-CM | POA: Diagnosis not present

## 2017-05-30 DIAGNOSIS — E0590 Thyrotoxicosis, unspecified without thyrotoxic crisis or storm: Secondary | ICD-10-CM | POA: Diagnosis not present

## 2017-05-30 DIAGNOSIS — Z1231 Encounter for screening mammogram for malignant neoplasm of breast: Secondary | ICD-10-CM

## 2017-05-30 LAB — TSH: TSH: 1.77 u[IU]/mL (ref 0.35–4.50)

## 2017-05-30 LAB — T4, FREE: Free T4: 0.71 ng/dL (ref 0.60–1.60)

## 2017-05-30 MED ORDER — AMLODIPINE BESYLATE 5 MG PO TABS: 5.0000 mg | ORAL_TABLET | Freq: Every day | ORAL | 1 refills | Status: DC

## 2017-05-30 MED ORDER — HYDROCHLOROTHIAZIDE 12.5 MG PO CAPS: 12.5000 mg | ORAL_CAPSULE | Freq: Every day | ORAL | 1 refills | Status: DC

## 2017-05-30 NOTE — Progress Notes (Signed)
Subjective:  Patient ID: Denise Morgan, female    DOB: 05/22/1965  Age: 52 y.o. MRN: [id number]  CC: Hypertension and Annual Exam   HPI Denise Morgan presents for a CPX.  She continues to struggle with chronic musculoskeletal pain and wants a refill on hydrocodone and acetaminophen.  She also takes meloxicam as needed.  She tells me her blood pressure has been well controlled.  She denies any recent episodes of headache/blurred vision/chest pain/shortness of breath/palpitations/fatigue/or edema.  Outpatient Medications Prior to Visit  Medication Sig Dispense Refill  . albuterol (PROVENTIL HFA;VENTOLIN HFA) 108 (90 Base) MCG/ACT inhaler Inhale 2 puffs into the lungs every 4 (four) hours as needed for wheezing. 1 Inhaler 11  . HYDROcodone-acetaminophen (NORCO/VICODIN) 5-325 MG tablet Take 2 tablets by mouth every 6 (six) hours as needed for moderate pain. 90 tablet 0  . meloxicam (MOBIC) 7.5 MG tablet TAKE 1 TABLET BY MOUTH EVERY DAY 90 tablet 1  . methimazole (TAPAZOLE) 10 MG tablet 1 tablet alternating with half tablet 45 tablet 3  . traZODone (DESYREL) 100 MG tablet Take 1 tablet (100 mg total) by mouth at bedtime. 90 tablet 3  . amLODipine (NORVASC) 5 MG tablet Take 1 tablet (5 mg total) by mouth daily. 90 tablet 1  . hydrochlorothiazide (MICROZIDE) 12.5 MG capsule Take 1 capsule (12.5 mg total) by mouth daily. 90 capsule 1   No facility-administered medications prior to visit.     ROS Review of Systems  Constitutional: Negative.  Negative for appetite change, diaphoresis, fatigue and unexpected weight change.  HENT: Negative.  Negative for trouble swallowing.   Eyes: Negative for visual disturbance.  Respiratory: Negative for apnea, cough, chest tightness, shortness of breath and wheezing.   Cardiovascular: Negative for chest pain, palpitations and leg swelling.  Gastrointestinal: Negative for abdominal pain, constipation, nausea and vomiting.  Endocrine: Negative.     Genitourinary: Negative.  Negative for difficulty urinating.  Musculoskeletal: Positive for arthralgias. Negative for myalgias and neck pain.  Skin: Negative.  Negative for color change, pallor and rash.  Allergic/Immunologic: Negative.   Neurological: Negative.  Negative for dizziness, weakness, light-headedness and numbness.  Hematological: Negative for adenopathy. Does not bruise/bleed easily.  Psychiatric/Behavioral: Negative.     Objective:  BP 138/82 (BP Location: Left Arm, Patient Position: Sitting, Cuff Size: Large)   Pulse 82   Temp 98.4 F (36.9 C) (Oral)   Resp 16   Ht 5\' 6"  (1.676 m)   Wt 262 lb 4 oz (119 kg)   LMP 03/01/2004   SpO2 97%   BMI 42.33 kg/m   BP Readings from Last 3 Encounters:  05/30/17 138/82  03/31/17 138/84  03/23/17 130/86    Wt Readings from Last 3 Encounters:  05/30/17 262 lb 4 oz (119 kg)  03/31/17 259 lb 8 oz (117.7 kg)  03/23/17 261 lb (118.4 kg)    Physical Exam  Constitutional: She is oriented to person, place, and time. No distress.  HENT:  Mouth/Throat: Oropharynx is clear and moist. No oropharyngeal exudate.  Eyes: Conjunctivae are normal. Left eye exhibits no discharge. No scleral icterus.  Neck: Normal range of motion. Neck supple. No JVD present. No thyromegaly present.  Cardiovascular: Normal rate, regular rhythm and normal heart sounds. Exam reveals no gallop and no friction rub.  No murmur heard. Pulmonary/Chest: Effort normal and breath sounds normal. No respiratory distress. She has no wheezes. She has no rales.  Abdominal: Soft. Bowel sounds are normal. She exhibits no distension and no mass. There  is no tenderness. There is no guarding.  Musculoskeletal: Normal range of motion. She exhibits no edema, tenderness or deformity.  Lymphadenopathy:    She has no cervical adenopathy.  Neurological: She is alert and oriented to person, place, and time.  Skin: Skin is warm and dry. No rash noted. She is not diaphoretic. No  erythema. No pallor.  Psychiatric: She has a normal mood and affect. Her behavior is normal. Judgment and thought content normal.  Vitals reviewed.   Lab Results  Component Value Date   WBC 5.2 03/31/2017   HGB 13.8 03/31/2017   HCT 40.3 03/31/2017   PLT 272.0 03/31/2017   GLUCOSE 93 03/31/2017   CHOL 202 (H) 03/31/2017   TRIG 68.0 03/31/2017   HDL 48.30 03/31/2017   LDLDIRECT 155.6 02/16/2013   LDLCALC 140 (H) 03/31/2017   ALT 14 03/31/2017   AST 13 03/31/2017   NA 138 03/31/2017   K 3.7 03/31/2017   CL 105 03/31/2017   CREATININE 0.63 03/31/2017   BUN 12 03/31/2017   CO2 26 03/31/2017   TSH 1.77 05/30/2017   INR 3.2 11/24/2012    Mm Digital Screening Bilateral  Result Date: 06/30/2015 CLINICAL DATA:  Screening. EXAM: DIGITAL SCREENING BILATERAL MAMMOGRAM WITH CAD COMPARISON:  Previous exam(s). ACR Breast Density Category b: There are scattered areas of fibroglandular density. FINDINGS: There are no findings suspicious for malignancy. Images were processed with CAD. IMPRESSION: No mammographic evidence of malignancy. A result letter of this screening mammogram will be mailed directly to the patient. RECOMMENDATION: Screening mammogram in one year. (Code:SM-B-01Y) BI-RADS CATEGORY  1: Negative. Electronically Signed   By: Lovey Newcomer M.D.   On: 07/01/2015 17:47    Assessment & Plan:   Denise Morgan was seen today for hypertension and annual exam.  Diagnoses and all orders for this visit:  Essential hypertension- Her blood pressure is well controlled.  Recent electrolytes and renal function were normal. -     amLODipine (NORVASC) 5 MG tablet; Take 1 tablet (5 mg total) by mouth daily. -     hydrochlorothiazide (MICROZIDE) 12.5 MG capsule; Take 1 capsule (12.5 mg total) by mouth daily.  Hyperlipidemia with target LDL less than 130- Her ASCVD risk score is not elevated so I do not recommend that she take a statin for CV risk reduction.  Visit for screening mammogram -     MM  DIGITAL SCREENING BILATERAL; Future  Routine general medical examination at a health care facility- Exam completed, labs reviewed, vaccines reviewed and updated, she is referred for a mammogram, screening for colon cancer/polyps is up-to-date, her Pap smear is up-to-date, patient education material was given.   I am having Denise Morgan maintain her albuterol, traZODone, methimazole, meloxicam, HYDROcodone-acetaminophen, amLODipine, and hydrochlorothiazide.  Meds ordered this encounter  Medications  . amLODipine (NORVASC) 5 MG tablet    Sig: Take 1 tablet (5 mg total) by mouth daily.    Dispense:  90 tablet    Refill:  1  . hydrochlorothiazide (MICROZIDE) 12.5 MG capsule    Sig: Take 1 capsule (12.5 mg total) by mouth daily.    Dispense:  90 capsule    Refill:  1     Follow-up: Return in about 6 months (around 11/29/2017).  Denise Calico, MD

## 2017-05-30 NOTE — Patient Instructions (Signed)

## 2017-06-01 ENCOUNTER — Ambulatory Visit: Payer: BLUE CROSS/BLUE SHIELD | Admitting: Endocrinology

## 2017-06-01 ENCOUNTER — Encounter: Payer: Self-pay | Admitting: Endocrinology

## 2017-06-01 VITALS — BP 132/78 | HR 77 | Ht 66.0 in | Wt 263.0 lb

## 2017-06-01 DIAGNOSIS — E0590 Thyrotoxicosis, unspecified without thyrotoxic crisis or storm: Secondary | ICD-10-CM | POA: Diagnosis not present

## 2017-06-01 MED ORDER — METHIMAZOLE 5 MG PO TABS: 5.0000 mg | ORAL_TABLET | Freq: Every day | ORAL | 2 refills | Status: DC

## 2017-06-01 NOTE — Progress Notes (Signed)
Patient ID: Denise Morgan, female   DOB: 01-Oct-1965, 52 y.o.   MRN: [id number]                                                                                                               Reason for Appointment:  Hyperthyroidism, follow-up  Referring physician: Scarlette Calico   Chief complaint: Tiredness   History of Present Illness:   Background information obtained on initial consultation: For the last several months the patient has been complaining about feeling tired. However last year she was having a rapid heartbeat and palpitations which went on for several weeks and these improved more recently She apparently had lost about 30 pounds last year starting in October although she was supposedly trying to lose weight  She also has had some persistent symptoms of feeling shaky at times  She has had hot flashes and sweating for several years  She does not complain of increased bowel movements  RECENT history: The patient was confirmed to have Graves' disease with high thyrotropin receptor antibody in 5/18 She started taking methimazole and with this her symptoms above improved  Her dose has been adjusted, initially taking 20 mg Since 7/18 had been taking 10 mg daily and since 10/18 has been taking 10 mg alternating with 5 mg  Her last visit was in 03/2017 and her dosage was continued unchanged She is feeling fairly good, not fatigued or lethargic, no cold intolerance or weight change  She is regular with taking her medication as directed  Her free T4 is trending slightly lower with TSH relatively higher than before also  Last thyrotropin receptor antibody is improved although still slightly above normal   Wt Readings from Last 3 Encounters:  05/30/17 262 lb 4 oz (119 kg)  03/31/17 259 lb 8 oz (117.7 kg)  03/23/17 261 lb (118.4 kg)     Thyroid function tests as follows:     Lab Results  Component Value Date   FREET4 0.71 05/30/2017   FREET4 0.85 03/16/2017   FREET4  0.84 01/12/2017   T3FREE 2.9 11/29/2016   T3FREE 5.0 (H) 07/27/2016   T3FREE 6.5 (H) 06/30/2016   TSH 1.77 05/30/2017   TSH 0.91 03/16/2017   TSH 1.23 01/12/2017    Lab Results  Component Value Date   THYROTRECAB 1.92 (H) 03/16/2017   THYROTRECAB 3.58 (H) 10/22/2016   THYROTRECAB 4.43 (H) 06/30/2016     Allergies as of 06/01/2017      Reactions   Latex Itching, Rash      Medication List        Accurate as of 06/01/17  8:07 AM. Always use your most recent med list.          albuterol 108 (90 Base) MCG/ACT inhaler Commonly known as:  PROVENTIL HFA;VENTOLIN HFA Inhale 2 puffs into the lungs every 4 (four) hours as needed for wheezing.   amLODipine 5 MG tablet Commonly known as:  NORVASC Take 1 tablet (5 mg total) by mouth daily.   hydrochlorothiazide 12.5 MG capsule  Commonly known as:  MICROZIDE Take 1 capsule (12.5 mg total) by mouth daily.   HYDROcodone-acetaminophen 5-325 MG tablet Commonly known as:  NORCO/VICODIN Take 2 tablets by mouth every 6 (six) hours as needed for moderate pain.   meloxicam 7.5 MG tablet Commonly known as:  MOBIC TAKE 1 TABLET BY MOUTH EVERY DAY   methimazole 10 MG tablet Commonly known as:  TAPAZOLE 1 tablet alternating with half tablet   traZODone 100 MG tablet Commonly known as:  DESYREL Take 1 tablet (100 mg total) by mouth at bedtime.           Past Medical History:  Diagnosis Date  . Allergy   . Anemia   . Arthritis   . Asthma   . Blood transfusion without reported diagnosis   . Depression   . Frequent headaches   . History of phlebitis   . Hx of adenomatous polyp of colon 08/15/2015  . Palpitations    heart monitor all normal per pt.2017  . PE (pulmonary embolism)     Past Surgical History:  Procedure Laterality Date  . ABDOMINAL HYSTERECTOMY  03/01/2004  . CHOLECYSTECTOMY      Family History  Problem Relation Age of Onset  . Colon polyps Mother   . Ovarian cancer Mother   . Arthritis Mother   . Colon  cancer Maternal Aunt   . Esophageal cancer Neg Hx   . Rectal cancer Neg Hx   . Stomach cancer Neg Hx     Social History:  reports that she quit smoking about 21 months ago. Her smoking use included cigarettes. She has a 15.00 pack-year smoking history. She has never used smokeless tobacco. She reports that she drinks about 4.2 oz of alcohol per week. She reports that she does not use drugs.  Allergies:  Allergies  Allergen Reactions  . Latex Itching and Rash     Review of Systems  She was started on HCTZ in addition to her amlodipine 5 mg daily in 10/18 with better control Blood pressure also followed by PCP  BP Readings from Last 3 Encounters:  06/01/17 132/78  05/30/17 138/82  03/31/17 138/84       Examination:   BP 132/78 (BP Location: Right Arm, Patient Position: Sitting, Cuff Size: Large)   Pulse 77   Ht 5\' 6"  (1.676 m)   LMP 03/01/2004   SpO2 98%   BMI 42.33 kg/m   No puffiness of the eyes, no swelling of the hands Thyroid not enlarged No skin changes seen   Assessment/Plan:   Hyperthyroidism, from Graves' disease diagnosed in 2/18   She has been on methimazole treatment since about 5/18 With her dose of 10 mg methimazole alternating with 5 mg her hyperthyroidism is well controlled Currently she feels fairly good and has no goiter She is showing a lower free T4 level compared to the last visit and this indicates we can start cutting back on her methimazole  She will take 5 mg methimazole for now on and follow-up in 6 weeks Discussed that we may be able to taper off her methimazole further on her next visit    HYPERTENSION: Controlled     Total visit time 15 minutes  Leonor Darnell 06/01/2017, 8:07 AM     Note: This office note was prepared with Dragon voice recognition system technology. Any transcriptional errors that result from this process are unintentional.

## 2017-06-13 ENCOUNTER — Telehealth: Payer: Self-pay | Admitting: Internal Medicine

## 2017-06-13 ENCOUNTER — Other Ambulatory Visit: Payer: Self-pay | Admitting: Internal Medicine

## 2017-06-13 DIAGNOSIS — G8929 Other chronic pain: Secondary | ICD-10-CM

## 2017-06-13 DIAGNOSIS — M545 Low back pain, unspecified: Secondary | ICD-10-CM

## 2017-06-13 DIAGNOSIS — M170 Bilateral primary osteoarthritis of knee: Secondary | ICD-10-CM

## 2017-06-13 DIAGNOSIS — M160 Bilateral primary osteoarthritis of hip: Secondary | ICD-10-CM

## 2017-06-13 DIAGNOSIS — G894 Chronic pain syndrome: Secondary | ICD-10-CM

## 2017-06-13 MED ORDER — HYDROCODONE-ACETAMINOPHEN 5-325 MG PO TABS: 2.0000 | ORAL_TABLET | Freq: Four times a day (QID) | ORAL | 0 refills | Status: DC | PRN

## 2017-06-13 NOTE — Telephone Encounter (Signed)
Copied from [place] [id number]. Topic: Quick Communication - Rx Refill/Question >> Jun 13, 2017  8:40 AM Scherrie Gerlach wrote: Medication: HYDROcodone-acetaminophen (NORCO/VICODIN) 5-325 MG tablet Has the patient contacted their pharmacy? yes  [id number] [place], [place] [phone] (Phone) [phone] (Fax)

## 2017-07-13 ENCOUNTER — Telehealth: Payer: Self-pay | Admitting: Internal Medicine

## 2017-07-13 DIAGNOSIS — G8929 Other chronic pain: Secondary | ICD-10-CM

## 2017-07-13 DIAGNOSIS — M170 Bilateral primary osteoarthritis of knee: Secondary | ICD-10-CM

## 2017-07-13 DIAGNOSIS — M545 Low back pain: Secondary | ICD-10-CM

## 2017-07-13 DIAGNOSIS — M160 Bilateral primary osteoarthritis of hip: Secondary | ICD-10-CM

## 2017-07-13 DIAGNOSIS — G894 Chronic pain syndrome: Secondary | ICD-10-CM

## 2017-07-13 MED ORDER — HYDROCODONE-ACETAMINOPHEN 5-325 MG PO TABS: 2.0000 | ORAL_TABLET | Freq: Four times a day (QID) | ORAL | 0 refills | Status: DC | PRN

## 2017-07-13 NOTE — Telephone Encounter (Signed)
Done erx 

## 2017-07-13 NOTE — Telephone Encounter (Signed)
Check [place] registry last filled 06/13/2017. MD is out of the office pls advise on refill.Marland KitchenJohny Morgan

## 2017-07-13 NOTE — Telephone Encounter (Signed)
Copied from [place] [id number]. Topic: Quick Communication - Rx Refill/Question >> Jul 13, 2017 12:09 PM Margot Ables wrote: Medication: HYDROcodone-acetaminophen (NORCO/VICODIN) 5-325 MG tablet - pt has been out for a couple of days - takes 2 pills Q6H prn Has the patient contacted their pharmacy? No - controlled Preferred Pharmacy (with phone number or street name): [id number] [place], [place] [phone] (Phone) [phone] (Fax)

## 2017-07-22 ENCOUNTER — Encounter: Payer: Self-pay | Admitting: Internal Medicine

## 2017-07-23 ENCOUNTER — Other Ambulatory Visit: Payer: Self-pay | Admitting: Internal Medicine

## 2017-07-23 DIAGNOSIS — G894 Chronic pain syndrome: Secondary | ICD-10-CM

## 2017-07-26 ENCOUNTER — Other Ambulatory Visit (INDEPENDENT_AMBULATORY_CARE_PROVIDER_SITE_OTHER): Payer: BLUE CROSS/BLUE SHIELD

## 2017-07-26 DIAGNOSIS — E0590 Thyrotoxicosis, unspecified without thyrotoxic crisis or storm: Secondary | ICD-10-CM | POA: Diagnosis not present

## 2017-07-26 LAB — TSH: TSH: 2.56 u[IU]/mL (ref 0.35–4.50)

## 2017-07-26 LAB — T4, FREE: Free T4: 0.72 ng/dL (ref 0.60–1.60)

## 2017-07-27 DIAGNOSIS — Z0289 Encounter for other administrative examinations: Secondary | ICD-10-CM

## 2017-07-28 ENCOUNTER — Ambulatory Visit: Payer: BLUE CROSS/BLUE SHIELD | Admitting: Endocrinology

## 2017-08-12 ENCOUNTER — Telehealth: Payer: Self-pay | Admitting: Internal Medicine

## 2017-08-12 NOTE — Telephone Encounter (Signed)
Check [place] registry last filled 07/13/2017../lmb  

## 2017-08-12 NOTE — Telephone Encounter (Signed)
Patient request refill for Norco     Last OV 06/01/17 with Dr.  Scarlette Calico    Last refill:  07/13/17  [facility] [place]

## 2017-08-12 NOTE — Telephone Encounter (Signed)
Copied from [place] [id number]. Topic: Quick Communication - Rx Refill/Question >> Aug 12, 2017  9:49 AM Yvette Rack wrote: Medication: HYDROcodone-acetaminophen (NORCO/VICODIN) 5-325 MG tablet  Has the patient contacted their pharmacy? No. Pt states that knowone ever told her to call the pharmacy for a control drug (Agent: If no, request that the patient contact the pharmacy for the refill.) (Agent: If yes, when and what did the pharmacy advise?)  Preferred Pharmacy (with phone number or street name): [id number] [place], [place] [phone] (Phone) [phone] (Fax)      Agent: Please be advised that RX refills may take up to 3 business days. We ask that you follow-up with your pharmacy.

## 2017-08-13 ENCOUNTER — Other Ambulatory Visit: Payer: Self-pay | Admitting: Internal Medicine

## 2017-08-13 DIAGNOSIS — G894 Chronic pain syndrome: Secondary | ICD-10-CM

## 2017-08-13 DIAGNOSIS — M545 Low back pain, unspecified: Secondary | ICD-10-CM

## 2017-08-13 DIAGNOSIS — M170 Bilateral primary osteoarthritis of knee: Secondary | ICD-10-CM

## 2017-08-13 DIAGNOSIS — M160 Bilateral primary osteoarthritis of hip: Secondary | ICD-10-CM

## 2017-08-13 DIAGNOSIS — G8929 Other chronic pain: Secondary | ICD-10-CM

## 2017-08-13 DIAGNOSIS — Z79891 Long term (current) use of opiate analgesic: Secondary | ICD-10-CM

## 2017-08-13 MED ORDER — NALOXONE HCL 4 MG/0.1ML NA LIQD: 1.0000 | Freq: Once | NASAL | 2 refills | Status: AC

## 2017-08-13 MED ORDER — HYDROCODONE-ACETAMINOPHEN 5-325 MG PO TABS: 2.0000 | ORAL_TABLET | Freq: Four times a day (QID) | ORAL | 0 refills | Status: DC | PRN

## 2017-08-15 NOTE — Telephone Encounter (Signed)
[facility]

## 2017-08-19 ENCOUNTER — Ambulatory Visit: Payer: BLUE CROSS/BLUE SHIELD

## 2017-08-25 ENCOUNTER — Ambulatory Visit
Admission: RE | Admit: 2017-08-25 | Discharge: 2017-08-25 | Disposition: A | Discharge status: Home / Self Care | Payer: BLUE CROSS/BLUE SHIELD | Source: Ambulatory Visit | Attending: Internal Medicine | Admitting: Internal Medicine

## 2017-08-25 DIAGNOSIS — Z1231 Encounter for screening mammogram for malignant neoplasm of breast: Secondary | ICD-10-CM | POA: Diagnosis not present

## 2017-08-26 LAB — HM MAMMOGRAPHY

## 2017-08-31 ENCOUNTER — Other Ambulatory Visit: Payer: Self-pay | Admitting: Endocrinology

## 2017-09-30 ENCOUNTER — Telehealth: Payer: Self-pay | Admitting: Internal Medicine

## 2017-09-30 NOTE — Telephone Encounter (Addendum)
Copied from [place] [phone]. Topic: General - Other >> Sep 30, 2017  8:13 AM Lennox Solders wrote: Reason for CRM: pt is calling and needs new rx hydrocodone. [facility] [address] Pt said pharm always tell her to call her doctor office

## 2017-10-04 ENCOUNTER — Other Ambulatory Visit: Payer: Self-pay | Admitting: Internal Medicine

## 2017-10-04 DIAGNOSIS — M545 Low back pain, unspecified: Secondary | ICD-10-CM

## 2017-10-04 DIAGNOSIS — G894 Chronic pain syndrome: Secondary | ICD-10-CM

## 2017-10-04 DIAGNOSIS — M160 Bilateral primary osteoarthritis of hip: Secondary | ICD-10-CM

## 2017-10-04 DIAGNOSIS — G8929 Other chronic pain: Secondary | ICD-10-CM

## 2017-10-04 DIAGNOSIS — M170 Bilateral primary osteoarthritis of knee: Secondary | ICD-10-CM

## 2017-10-04 MED ORDER — HYDROCODONE-ACETAMINOPHEN 5-325 MG PO TABS: 2.0000 | ORAL_TABLET | Freq: Four times a day (QID) | ORAL | 0 refills | Status: DC | PRN

## 2017-11-09 ENCOUNTER — Other Ambulatory Visit: Payer: Self-pay | Admitting: Internal Medicine

## 2017-11-09 DIAGNOSIS — G8929 Other chronic pain: Secondary | ICD-10-CM

## 2017-11-09 DIAGNOSIS — M170 Bilateral primary osteoarthritis of knee: Secondary | ICD-10-CM

## 2017-11-09 DIAGNOSIS — M545 Low back pain: Secondary | ICD-10-CM

## 2017-11-09 DIAGNOSIS — M160 Bilateral primary osteoarthritis of hip: Secondary | ICD-10-CM

## 2017-12-15 ENCOUNTER — Telehealth: Payer: Self-pay | Admitting: Internal Medicine

## 2017-12-15 NOTE — Telephone Encounter (Unsigned)
Copied from [place] [phone]. Topic: Quick Communication - Rx Refill/Question >> Dec 15, 2017  9:45 AM Judyann Munson wrote: Medication: HYDROcodone-acetaminophen (NORCO/VICODIN) 5-325 MG tablet  Has the patient contacted their pharmacy?no  Preferred Pharmacy (with phone number or street name): [id number] [place], [place] [address] Phone: [phone] Fax: [phone]    Agent: Please be advised that RX refills may take up to 3 business days. We ask that you follow-up with your pharmacy.

## 2017-12-16 ENCOUNTER — Other Ambulatory Visit: Payer: Self-pay | Admitting: Internal Medicine

## 2017-12-16 DIAGNOSIS — M545 Low back pain: Secondary | ICD-10-CM

## 2017-12-16 DIAGNOSIS — M170 Bilateral primary osteoarthritis of knee: Secondary | ICD-10-CM

## 2017-12-16 DIAGNOSIS — M160 Bilateral primary osteoarthritis of hip: Secondary | ICD-10-CM

## 2017-12-16 DIAGNOSIS — G894 Chronic pain syndrome: Secondary | ICD-10-CM

## 2017-12-16 DIAGNOSIS — G8929 Other chronic pain: Secondary | ICD-10-CM

## 2017-12-16 MED ORDER — HYDROCODONE-ACETAMINOPHEN 5-325 MG PO TABS: 2.0000 | ORAL_TABLET | Freq: Four times a day (QID) | ORAL | 0 refills | Status: DC | PRN

## 2017-12-16 NOTE — Telephone Encounter (Signed)
Routing to dr jones, please advise, thanks 

## 2018-01-19 ENCOUNTER — Other Ambulatory Visit: Payer: Self-pay | Admitting: Endocrinology

## 2018-01-20 ENCOUNTER — Ambulatory Visit (INDEPENDENT_AMBULATORY_CARE_PROVIDER_SITE_OTHER): Payer: BLUE CROSS/BLUE SHIELD

## 2018-01-20 DIAGNOSIS — Z23 Encounter for immunization: Secondary | ICD-10-CM

## 2018-02-13 ENCOUNTER — Telehealth: Payer: Self-pay | Admitting: Internal Medicine

## 2018-02-13 DIAGNOSIS — M170 Bilateral primary osteoarthritis of knee: Secondary | ICD-10-CM

## 2018-02-13 DIAGNOSIS — M545 Low back pain: Secondary | ICD-10-CM

## 2018-02-13 DIAGNOSIS — G8929 Other chronic pain: Secondary | ICD-10-CM

## 2018-02-13 DIAGNOSIS — M160 Bilateral primary osteoarthritis of hip: Secondary | ICD-10-CM

## 2018-02-13 DIAGNOSIS — G894 Chronic pain syndrome: Secondary | ICD-10-CM

## 2018-02-13 NOTE — Telephone Encounter (Signed)
Copied from [place] [phone]. Topic: Quick Communication - Rx Refill/Question >> Feb 13, 2018  8:08 AM Reyne Dumas L wrote: Medication: HYDROcodone-acetaminophen (NORCO/VICODIN) 5-325 MG tablet  Has the patient contacted their pharmacy? No - pt states pharmacy always tells her to call.  Advised pt to call pharmacy next time. (Agent: If no, request that the patient contact the pharmacy for the refill.) (Agent: If yes, when and what did the pharmacy advise?)  Preferred Pharmacy (with phone number or street name): [id number] [place], [place] [phone] (Phone) [phone] (Fax)  Agent: Please be advised that RX refills may take up to 3 business days. We ask that you follow-up with your pharmacy.

## 2018-02-14 NOTE — Telephone Encounter (Signed)
Pt is past due for an appt.

## 2018-02-15 NOTE — Telephone Encounter (Signed)
Appt made

## 2018-02-17 NOTE — Telephone Encounter (Signed)
Pt wants to know why this was denied

## 2018-02-17 NOTE — Telephone Encounter (Addendum)
Contacted pt and informed pt that the rx was denied due to pt needing an appt. I explained that follow appt was needed in October. Informed pt that rx refill will be addressed at the 03/20/2018 visit.   I tried to inform pt that PCP denied the refill after he knew the appt was made.

## 2018-03-20 ENCOUNTER — Encounter: Payer: Self-pay | Admitting: Internal Medicine

## 2018-03-20 ENCOUNTER — Other Ambulatory Visit (INDEPENDENT_AMBULATORY_CARE_PROVIDER_SITE_OTHER): Payer: BLUE CROSS/BLUE SHIELD

## 2018-03-20 ENCOUNTER — Ambulatory Visit: Payer: BLUE CROSS/BLUE SHIELD | Admitting: Internal Medicine

## 2018-03-20 VITALS — BP 136/86 | HR 74 | Temp 98.0°F | Resp 16 | Ht 66.0 in | Wt 274.0 lb

## 2018-03-20 DIAGNOSIS — E0590 Thyrotoxicosis, unspecified without thyrotoxic crisis or storm: Secondary | ICD-10-CM | POA: Diagnosis not present

## 2018-03-20 DIAGNOSIS — Z79891 Long term (current) use of opiate analgesic: Secondary | ICD-10-CM | POA: Insufficient documentation

## 2018-03-20 DIAGNOSIS — M545 Low back pain: Secondary | ICD-10-CM

## 2018-03-20 DIAGNOSIS — I10 Essential (primary) hypertension: Secondary | ICD-10-CM | POA: Diagnosis not present

## 2018-03-20 DIAGNOSIS — G8929 Other chronic pain: Secondary | ICD-10-CM

## 2018-03-20 DIAGNOSIS — Z0000 Encounter for general adult medical examination without abnormal findings: Secondary | ICD-10-CM

## 2018-03-20 DIAGNOSIS — M170 Bilateral primary osteoarthritis of knee: Secondary | ICD-10-CM

## 2018-03-20 DIAGNOSIS — G894 Chronic pain syndrome: Secondary | ICD-10-CM

## 2018-03-20 DIAGNOSIS — M160 Bilateral primary osteoarthritis of hip: Secondary | ICD-10-CM

## 2018-03-20 LAB — BASIC METABOLIC PANEL
BUN: 16 mg/dL (ref 6–23)
CALCIUM: 9.7 mg/dL (ref 8.4–10.5)
CO2: 22 mEq/L (ref 19–32)
Chloride: 102 mEq/L (ref 96–112)
Creatinine, Ser: 0.8 mg/dL (ref 0.40–1.20)
GFR: 90.85 mL/min (ref >60.00)
Glucose, Bld: 91 mg/dL (ref 70–99)
Potassium: 3.8 mEq/L (ref 3.5–5.1)
SODIUM: 138 meq/L (ref 135–145)

## 2018-03-20 LAB — TSH: TSH: 1.6 u[IU]/mL (ref 0.35–4.50)

## 2018-03-20 MED ORDER — AMLODIPINE BESYLATE 5 MG PO TABS: 5.0000 mg | ORAL_TABLET | Freq: Every day | ORAL | 1 refills | Status: DC

## 2018-03-20 MED ORDER — HYDROCHLOROTHIAZIDE 12.5 MG PO CAPS: 12.5000 mg | ORAL_CAPSULE | Freq: Every day | ORAL | 1 refills | Status: DC

## 2018-03-20 MED ORDER — HYDROCODONE-ACETAMINOPHEN 5-325 MG PO TABS: 2.0000 | ORAL_TABLET | Freq: Four times a day (QID) | ORAL | 0 refills | Status: DC | PRN

## 2018-03-20 NOTE — Progress Notes (Signed)
Subjective:  Patient ID: Denise Morgan, female    DOB: May 28, 1965  Age: 53 y.o. MRN: [id number]  CC: Hypertension and Osteoarthritis   HPI [name] presents for f/up - She complains of pain.  She tells me she ran out of Norco about a month ago and since then she has been adding Aleve to meloxicam to try to control her pain.  She is not getting much symptom relief.  She complains that the pain interferes with her activities of daily living and she has not been able to exercise.  She has subsequently gained weight.  Outpatient Medications Prior to Visit  Medication Sig Dispense Refill  . albuterol (PROVENTIL HFA;VENTOLIN HFA) 108 (90 Base) MCG/ACT inhaler Inhale 2 puffs into the lungs every 4 (four) hours as needed for wheezing. 1 Inhaler 11  . meloxicam (MOBIC) 7.5 MG tablet TAKE 1 TABLET BY MOUTH EVERY DAY 90 tablet 1  . methimazole (TAPAZOLE) 5 MG tablet TAKE 1 TABLET BY MOUTH EVERY DAY 90 tablet 0  . traZODone (DESYREL) 100 MG tablet TAKE 1 TABLET BY MOUTH EVERYDAY AT BEDTIME 90 tablet 3  . amLODipine (NORVASC) 5 MG tablet Take 1 tablet (5 mg total) by mouth daily. 90 tablet 1  . hydrochlorothiazide (MICROZIDE) 12.5 MG capsule Take 1 capsule (12.5 mg total) by mouth daily. 90 capsule 1  . HYDROcodone-acetaminophen (NORCO/VICODIN) 5-325 MG tablet Take 2 tablets by mouth every 6 (six) hours as needed for moderate pain. 90 tablet 0  . methimazole (TAPAZOLE) 10 MG tablet 1 TABLET ALTERNATING WITH HALF TABLET 45 tablet 3   No facility-administered medications prior to visit.     ROS Review of Systems  Constitutional: Positive for unexpected weight change (wt gain). Negative for appetite change, diaphoresis and fatigue.  HENT: Negative.   Eyes: Negative for visual disturbance.  Respiratory: Negative for cough, chest tightness, shortness of breath and wheezing.   Cardiovascular: Negative for chest pain, palpitations and leg swelling.  Gastrointestinal: Negative for abdominal pain,  constipation, diarrhea, nausea and vomiting.  Endocrine: Negative for cold intolerance and heat intolerance.  Genitourinary: Negative.   Musculoskeletal: Positive for arthralgias and back pain. Negative for myalgias and neck pain.  Neurological: Negative.  Negative for dizziness, weakness, light-headedness and headaches.  Hematological: Negative for adenopathy. Does not bruise/bleed easily.  Psychiatric/Behavioral: Negative.     Objective:  BP 136/86 (BP Location: Left Arm, Patient Position: Sitting, Cuff Size: Large)   Pulse 74   Temp 98 F (36.7 C) (Oral)   Resp 16   Ht 5\' 6"  (1.676 m)   Wt 274 lb (124.3 kg)   LMP 03/01/2004   SpO2 98%   BMI 44.22 kg/m   BP Readings from Last 3 Encounters:  03/20/18 136/86  06/01/17 132/78  05/30/17 138/82    Wt Readings from Last 3 Encounters:  03/20/18 274 lb (124.3 kg)  06/01/17 263 lb (119.3 kg)  05/30/17 262 lb 4 oz (119 kg)    Physical Exam Vitals signs reviewed.  Constitutional:      Appearance: She is obese. She is ill-appearing (she occasionally winces in pain ). She is not diaphoretic.  HENT:     Right Ear: There is no impacted cerumen.     Left Ear: There is no impacted cerumen.     Nose: Nose normal. No congestion or rhinorrhea.     Mouth/Throat:     Mouth: Mucous membranes are moist.     Pharynx: Oropharynx is clear. No oropharyngeal exudate or posterior oropharyngeal erythema.  Eyes:     General: No scleral icterus.    Conjunctiva/sclera: Conjunctivae normal.  Neck:     Musculoskeletal: Normal range of motion and neck supple. No neck rigidity or muscular tenderness.  Cardiovascular:     Rate and Rhythm: Normal rate and regular rhythm.     Heart sounds: No murmur. No gallop.   Pulmonary:     Effort: Pulmonary effort is normal.     Breath sounds: No stridor. No wheezing, rhonchi or rales.  Abdominal:     General: Bowel sounds are normal.     Palpations: There is no hepatomegaly, splenomegaly or mass.      Tenderness: There is no abdominal tenderness.     Hernia: No hernia is present.  Musculoskeletal: Normal range of motion.        General: No swelling.     Right lower leg: No edema.     Left lower leg: No edema.  Skin:    General: Skin is warm and dry.     Coloration: Skin is not pale.     Findings: No rash.  Neurological:     General: No focal deficit present.     Mental Status: She is oriented to person, place, and time. Mental status is at baseline.     Lab Results  Component Value Date   WBC 5.2 03/31/2017   HGB 13.8 03/31/2017   HCT 40.3 03/31/2017   PLT 272.0 03/31/2017   GLUCOSE 91 03/20/2018   CHOL 202 (H) 03/31/2017   TRIG 68.0 03/31/2017   HDL 48.30 03/31/2017   LDLDIRECT 155.6 02/16/2013   LDLCALC 140 (H) 03/31/2017   ALT 14 03/31/2017   AST 13 03/31/2017   NA 138 03/20/2018   K 3.8 03/20/2018   CL 102 03/20/2018   CREATININE 0.80 03/20/2018   BUN 16 03/20/2018   CO2 22 03/20/2018   TSH 1.60 03/20/2018   INR 3.2 11/24/2012    Mm Digital Screening Bilateral  Result Date: 08/26/2017 CLINICAL DATA:  Screening. EXAM: DIGITAL SCREENING BILATERAL MAMMOGRAM WITH CAD COMPARISON:  Previous exam(s). ACR Breast Density Category a: The breast tissue is almost entirely fatty. FINDINGS: There are no findings suspicious for malignancy. Images were processed with CAD. IMPRESSION: No mammographic evidence of malignancy. A result letter of this screening mammogram will be mailed directly to the patient. RECOMMENDATION: Screening mammogram in one year. (Code:SM-B-01Y) BI-RADS CATEGORY  1: Negative. Electronically Signed   By: Ammie Ferrier M.D.   On: 08/26/2017 09:30    Assessment & Plan:   Anique was seen today for hypertension and osteoarthritis.  Diagnoses and all orders for this visit:  Essential hypertension- Her blood pressure is not quite adequately well controlled.  I think part of the problem is that she is taking 2 NSAIDs.  I have asked her to stop taking  Aleve and to only take meloxicam.  Will continue the current antihypertensive regimen for now.  Her electrolytes and renal function are normal. -     Basic metabolic panel; Future -     amLODipine (NORVASC) 5 MG tablet; Take 1 tablet (5 mg total) by mouth daily. -     hydrochlorothiazide (MICROZIDE) 12.5 MG capsule; Take 1 capsule (12.5 mg total) by mouth daily.  Routine general medical examination at a health care facility  Hyperthyroidism- Her TSH is in the normal range.  She will remain on the current dose of levothyroxine. -     TSH; Future  Encounter for long-term opiate analgesic use- I  will check a urine drug screen to screen for substance abuse. -     Pain Mgmt, Profile 8 w/Conf, U; Future  Primary osteoarthritis of both knees -     HYDROcodone-acetaminophen (NORCO/VICODIN) 5-325 MG tablet; Take 2 tablets by mouth every 6 (six) hours as needed for moderate pain.  Chronic pain syndrome -     HYDROcodone-acetaminophen (NORCO/VICODIN) 5-325 MG tablet; Take 2 tablets by mouth every 6 (six) hours as needed for moderate pain.  Primary osteoarthritis of both hips -     HYDROcodone-acetaminophen (NORCO/VICODIN) 5-325 MG tablet; Take 2 tablets by mouth every 6 (six) hours as needed for moderate pain.  Chronic bilateral low back pain without sciatica -     HYDROcodone-acetaminophen (NORCO/VICODIN) 5-325 MG tablet; Take 2 tablets by mouth every 6 (six) hours as needed for moderate pain.   I am having Chaquetta Courington maintain her albuterol, traZODone, methimazole, meloxicam, amLODipine, hydrochlorothiazide, and HYDROcodone-acetaminophen.  Meds ordered this encounter  Medications  . amLODipine (NORVASC) 5 MG tablet    Sig: Take 1 tablet (5 mg total) by mouth daily.    Dispense:  90 tablet    Refill:  1  . hydrochlorothiazide (MICROZIDE) 12.5 MG capsule    Sig: Take 1 capsule (12.5 mg total) by mouth daily.    Dispense:  90 capsule    Refill:  1  . HYDROcodone-acetaminophen  (NORCO/VICODIN) 5-325 MG tablet    Sig: Take 2 tablets by mouth every 6 (six) hours as needed for moderate pain.    Dispense:  90 tablet    Refill:  0     Follow-up: Return in about 4 months (around 07/19/2018).  Scarlette Calico, MD

## 2018-03-20 NOTE — Patient Instructions (Signed)

## 2018-03-21 LAB — PAIN MGMT, PROFILE 8 W/CONF, U
6 Acetylmorphine: NEGATIVE ng/mL (ref <10)
Alcohol Metabolites: NEGATIVE ng/mL (ref <500)
Amphetamines: NEGATIVE ng/mL (ref <500)
BENZODIAZEPINES: NEGATIVE ng/mL (ref <100)
Buprenorphine, Urine: NEGATIVE ng/mL (ref <5)
COCAINE METABOLITE: NEGATIVE ng/mL (ref <150)
CREATININE: 88.7 mg/dL
MDMA: NEGATIVE ng/mL (ref <500)
Marijuana Metabolite: NEGATIVE ng/mL (ref <20)
OPIATES: NEGATIVE ng/mL (ref <100)
OXIDANT: NEGATIVE ug/mL (ref <200)
OXYCODONE: NEGATIVE ng/mL (ref <100)
PH: 6.27 (ref 4.5–9.0)

## 2018-05-09 ENCOUNTER — Other Ambulatory Visit: Payer: Self-pay | Admitting: Internal Medicine

## 2018-05-09 DIAGNOSIS — M160 Bilateral primary osteoarthritis of hip: Secondary | ICD-10-CM

## 2018-05-09 DIAGNOSIS — G8929 Other chronic pain: Secondary | ICD-10-CM

## 2018-05-09 DIAGNOSIS — M545 Low back pain: Secondary | ICD-10-CM

## 2018-05-09 DIAGNOSIS — M170 Bilateral primary osteoarthritis of knee: Secondary | ICD-10-CM

## 2018-05-09 DIAGNOSIS — G894 Chronic pain syndrome: Secondary | ICD-10-CM

## 2018-05-09 MED ORDER — HYDROCODONE-ACETAMINOPHEN 5-325 MG PO TABS: 2.0000 | ORAL_TABLET | Freq: Four times a day (QID) | ORAL | 0 refills | Status: DC | PRN

## 2018-05-27 ENCOUNTER — Other Ambulatory Visit: Payer: Self-pay | Admitting: Internal Medicine

## 2018-05-27 DIAGNOSIS — M160 Bilateral primary osteoarthritis of hip: Secondary | ICD-10-CM

## 2018-05-27 DIAGNOSIS — M170 Bilateral primary osteoarthritis of knee: Secondary | ICD-10-CM

## 2018-05-27 DIAGNOSIS — M545 Low back pain, unspecified: Secondary | ICD-10-CM

## 2018-05-27 DIAGNOSIS — G8929 Other chronic pain: Secondary | ICD-10-CM

## 2018-06-28 ENCOUNTER — Other Ambulatory Visit: Payer: Self-pay | Admitting: Internal Medicine

## 2018-06-28 DIAGNOSIS — G8929 Other chronic pain: Secondary | ICD-10-CM

## 2018-06-28 DIAGNOSIS — G894 Chronic pain syndrome: Secondary | ICD-10-CM

## 2018-06-28 DIAGNOSIS — M545 Low back pain: Secondary | ICD-10-CM

## 2018-06-28 DIAGNOSIS — M170 Bilateral primary osteoarthritis of knee: Secondary | ICD-10-CM

## 2018-06-28 DIAGNOSIS — M160 Bilateral primary osteoarthritis of hip: Secondary | ICD-10-CM

## 2018-06-29 ENCOUNTER — Other Ambulatory Visit: Payer: Self-pay | Admitting: Internal Medicine

## 2018-06-29 DIAGNOSIS — M545 Low back pain, unspecified: Secondary | ICD-10-CM

## 2018-06-29 DIAGNOSIS — M170 Bilateral primary osteoarthritis of knee: Secondary | ICD-10-CM

## 2018-06-29 DIAGNOSIS — G894 Chronic pain syndrome: Secondary | ICD-10-CM

## 2018-06-29 DIAGNOSIS — G8929 Other chronic pain: Secondary | ICD-10-CM

## 2018-06-29 DIAGNOSIS — M160 Bilateral primary osteoarthritis of hip: Secondary | ICD-10-CM

## 2018-06-29 MED ORDER — HYDROCODONE-ACETAMINOPHEN 5-325 MG PO TABS: 2.0000 | ORAL_TABLET | Freq: Four times a day (QID) | ORAL | 0 refills | Status: DC | PRN

## 2018-06-29 NOTE — Telephone Encounter (Signed)
Per database, hydrocodone last filled on 05/09/2018. LOV with PCP 03/20/2018

## 2018-07-21 ENCOUNTER — Other Ambulatory Visit: Payer: Self-pay | Admitting: Internal Medicine

## 2018-07-21 DIAGNOSIS — G894 Chronic pain syndrome: Secondary | ICD-10-CM

## 2018-08-27 ENCOUNTER — Other Ambulatory Visit: Payer: Self-pay | Admitting: Internal Medicine

## 2018-08-27 DIAGNOSIS — M545 Low back pain, unspecified: Secondary | ICD-10-CM

## 2018-08-27 DIAGNOSIS — G894 Chronic pain syndrome: Secondary | ICD-10-CM

## 2018-08-27 DIAGNOSIS — M160 Bilateral primary osteoarthritis of hip: Secondary | ICD-10-CM

## 2018-08-27 DIAGNOSIS — G8929 Other chronic pain: Secondary | ICD-10-CM

## 2018-08-27 DIAGNOSIS — M170 Bilateral primary osteoarthritis of knee: Secondary | ICD-10-CM

## 2018-08-28 MED ORDER — HYDROCODONE-ACETAMINOPHEN 5-325 MG PO TABS: 2.0000 | ORAL_TABLET | Freq: Four times a day (QID) | ORAL | 0 refills | Status: DC | PRN

## 2018-09-21 ENCOUNTER — Other Ambulatory Visit: Payer: Self-pay | Admitting: Internal Medicine

## 2018-09-21 DIAGNOSIS — I10 Essential (primary) hypertension: Secondary | ICD-10-CM

## 2018-10-13 ENCOUNTER — Other Ambulatory Visit: Payer: Self-pay | Admitting: Internal Medicine

## 2018-10-13 DIAGNOSIS — Z1231 Encounter for screening mammogram for malignant neoplasm of breast: Secondary | ICD-10-CM

## 2018-10-16 ENCOUNTER — Other Ambulatory Visit: Payer: Self-pay | Admitting: Internal Medicine

## 2018-10-16 DIAGNOSIS — M170 Bilateral primary osteoarthritis of knee: Secondary | ICD-10-CM

## 2018-10-16 DIAGNOSIS — G894 Chronic pain syndrome: Secondary | ICD-10-CM

## 2018-10-16 DIAGNOSIS — G8929 Other chronic pain: Secondary | ICD-10-CM

## 2018-10-16 DIAGNOSIS — M160 Bilateral primary osteoarthritis of hip: Secondary | ICD-10-CM

## 2018-10-16 MED ORDER — HYDROCODONE-ACETAMINOPHEN 5-325 MG PO TABS: 2.0000 | ORAL_TABLET | Freq: Four times a day (QID) | ORAL | 0 refills | Status: DC | PRN

## 2018-11-24 ENCOUNTER — Other Ambulatory Visit: Payer: Self-pay | Admitting: Internal Medicine

## 2018-11-24 DIAGNOSIS — G8929 Other chronic pain: Secondary | ICD-10-CM

## 2018-11-24 DIAGNOSIS — G894 Chronic pain syndrome: Secondary | ICD-10-CM

## 2018-11-24 DIAGNOSIS — M170 Bilateral primary osteoarthritis of knee: Secondary | ICD-10-CM

## 2018-11-24 DIAGNOSIS — M545 Low back pain, unspecified: Secondary | ICD-10-CM

## 2018-11-24 DIAGNOSIS — M160 Bilateral primary osteoarthritis of hip: Secondary | ICD-10-CM

## 2018-11-24 MED ORDER — HYDROCODONE-ACETAMINOPHEN 5-325 MG PO TABS: 2.0000 | ORAL_TABLET | Freq: Four times a day (QID) | ORAL | 0 refills | Status: DC | PRN

## 2018-11-30 ENCOUNTER — Ambulatory Visit: Payer: BLUE CROSS/BLUE SHIELD

## 2018-12-27 ENCOUNTER — Other Ambulatory Visit: Payer: Self-pay | Admitting: Internal Medicine

## 2018-12-27 DIAGNOSIS — M545 Low back pain, unspecified: Secondary | ICD-10-CM

## 2018-12-27 DIAGNOSIS — M170 Bilateral primary osteoarthritis of knee: Secondary | ICD-10-CM

## 2018-12-27 DIAGNOSIS — G894 Chronic pain syndrome: Secondary | ICD-10-CM

## 2018-12-27 DIAGNOSIS — M160 Bilateral primary osteoarthritis of hip: Secondary | ICD-10-CM

## 2018-12-27 DIAGNOSIS — G8929 Other chronic pain: Secondary | ICD-10-CM

## 2018-12-27 MED ORDER — HYDROCODONE-ACETAMINOPHEN 5-325 MG PO TABS: 2.0000 | ORAL_TABLET | Freq: Four times a day (QID) | ORAL | 0 refills | Status: DC | PRN

## 2019-01-12 ENCOUNTER — Ambulatory Visit
Admission: RE | Admit: 2019-01-12 | Discharge: 2019-01-12 | Disposition: A | Discharge status: Home / Self Care | Payer: BLUE CROSS/BLUE SHIELD | Source: Ambulatory Visit | Attending: Internal Medicine | Admitting: Internal Medicine

## 2019-01-12 ENCOUNTER — Other Ambulatory Visit: Payer: Self-pay

## 2019-01-12 DIAGNOSIS — Z1231 Encounter for screening mammogram for malignant neoplasm of breast: Secondary | ICD-10-CM | POA: Diagnosis not present

## 2019-01-16 ENCOUNTER — Other Ambulatory Visit: Payer: Self-pay | Admitting: Internal Medicine

## 2019-01-16 DIAGNOSIS — R928 Other abnormal and inconclusive findings on diagnostic imaging of breast: Secondary | ICD-10-CM

## 2019-01-18 ENCOUNTER — Ambulatory Visit
Admission: RE | Admit: 2019-01-18 | Discharge: 2019-01-18 | Disposition: A | Discharge status: Home / Self Care | Payer: BC Managed Care – PPO | Source: Ambulatory Visit | Attending: Internal Medicine | Admitting: Internal Medicine

## 2019-01-18 ENCOUNTER — Other Ambulatory Visit: Payer: Self-pay

## 2019-01-18 DIAGNOSIS — R928 Other abnormal and inconclusive findings on diagnostic imaging of breast: Secondary | ICD-10-CM

## 2019-01-18 DIAGNOSIS — N6002 Solitary cyst of left breast: Secondary | ICD-10-CM | POA: Diagnosis not present

## 2019-02-16 ENCOUNTER — Other Ambulatory Visit: Payer: Self-pay | Admitting: Internal Medicine

## 2019-02-16 DIAGNOSIS — M160 Bilateral primary osteoarthritis of hip: Secondary | ICD-10-CM

## 2019-02-16 DIAGNOSIS — G8929 Other chronic pain: Secondary | ICD-10-CM

## 2019-02-16 DIAGNOSIS — M170 Bilateral primary osteoarthritis of knee: Secondary | ICD-10-CM

## 2019-02-16 DIAGNOSIS — G894 Chronic pain syndrome: Secondary | ICD-10-CM

## 2019-02-18 ENCOUNTER — Other Ambulatory Visit: Payer: Self-pay | Admitting: Internal Medicine

## 2019-02-18 DIAGNOSIS — G894 Chronic pain syndrome: Secondary | ICD-10-CM

## 2019-02-18 DIAGNOSIS — M170 Bilateral primary osteoarthritis of knee: Secondary | ICD-10-CM

## 2019-02-18 DIAGNOSIS — M160 Bilateral primary osteoarthritis of hip: Secondary | ICD-10-CM

## 2019-02-18 DIAGNOSIS — G8929 Other chronic pain: Secondary | ICD-10-CM

## 2019-02-20 ENCOUNTER — Other Ambulatory Visit: Payer: Self-pay | Admitting: Endocrinology

## 2019-02-20 ENCOUNTER — Other Ambulatory Visit: Payer: Self-pay | Admitting: Internal Medicine

## 2019-02-20 DIAGNOSIS — G894 Chronic pain syndrome: Secondary | ICD-10-CM

## 2019-02-20 NOTE — Telephone Encounter (Signed)
Pt was to follow up in May. Pt will need to schedule an appt before refills can be sent. The encounter will not let me refuse medication.

## 2019-02-20 NOTE — Telephone Encounter (Signed)
Patient calling to check status of this request. Patient is out of medication.

## 2019-02-21 ENCOUNTER — Other Ambulatory Visit: Payer: Self-pay

## 2019-02-21 ENCOUNTER — Ambulatory Visit (INDEPENDENT_AMBULATORY_CARE_PROVIDER_SITE_OTHER): Payer: BC Managed Care – PPO | Admitting: Internal Medicine

## 2019-02-21 ENCOUNTER — Encounter: Payer: Self-pay | Admitting: Internal Medicine

## 2019-02-21 VITALS — BP 142/92 | HR 79 | Temp 98.2°F | Resp 16 | Ht 66.0 in | Wt 270.0 lb

## 2019-02-21 DIAGNOSIS — G8929 Other chronic pain: Secondary | ICD-10-CM

## 2019-02-21 DIAGNOSIS — M545 Low back pain, unspecified: Secondary | ICD-10-CM

## 2019-02-21 DIAGNOSIS — M170 Bilateral primary osteoarthritis of knee: Secondary | ICD-10-CM

## 2019-02-21 DIAGNOSIS — I10 Essential (primary) hypertension: Secondary | ICD-10-CM

## 2019-02-21 DIAGNOSIS — E785 Hyperlipidemia, unspecified: Secondary | ICD-10-CM | POA: Diagnosis not present

## 2019-02-21 DIAGNOSIS — Z0000 Encounter for general adult medical examination without abnormal findings: Secondary | ICD-10-CM | POA: Diagnosis not present

## 2019-02-21 DIAGNOSIS — G894 Chronic pain syndrome: Secondary | ICD-10-CM

## 2019-02-21 DIAGNOSIS — M160 Bilateral primary osteoarthritis of hip: Secondary | ICD-10-CM

## 2019-02-21 DIAGNOSIS — E0590 Thyrotoxicosis, unspecified without thyrotoxic crisis or storm: Secondary | ICD-10-CM

## 2019-02-21 DIAGNOSIS — Z79891 Long term (current) use of opiate analgesic: Secondary | ICD-10-CM

## 2019-02-21 LAB — LIPID PANEL
Cholesterol: 253 mg/dL — ABNORMAL HIGH (ref 0–200)
HDL: 67.3 mg/dL (ref >39.00)
LDL Cholesterol: 171 mg/dL — ABNORMAL HIGH (ref 0–99)
NonHDL: 185.88
Total CHOL/HDL Ratio: 4
Triglycerides: 73 mg/dL (ref 0.0–149.0)
VLDL: 14.6 mg/dL (ref 0.0–40.0)

## 2019-02-21 LAB — CBC WITH DIFFERENTIAL/PLATELET
Basophils Absolute: 0.1 10*3/uL (ref 0.0–0.1)
Basophils Relative: 1.1 % (ref 0.0–3.0)
Eosinophils Absolute: 0.1 10*3/uL (ref 0.0–0.7)
Eosinophils Relative: 1.1 % (ref 0.0–5.0)
HCT: 41.7 % (ref 36.0–46.0)
Hemoglobin: 14 g/dL (ref 12.0–15.0)
Lymphocytes Relative: 41.4 % (ref 12.0–46.0)
Lymphs Abs: 3.2 10*3/uL (ref 0.7–4.0)
MCHC: 33.6 g/dL (ref 30.0–36.0)
MCV: 101.4 fl — ABNORMAL HIGH (ref 78.0–100.0)
Monocytes Absolute: 0.4 10*3/uL (ref 0.1–1.0)
Monocytes Relative: 5.1 % (ref 3.0–12.0)
Neutro Abs: 3.9 10*3/uL (ref 1.4–7.7)
Neutrophils Relative %: 51.3 % (ref 43.0–77.0)
Platelets: 268 10*3/uL (ref 150.0–400.0)
RBC: 4.11 Mil/uL (ref 3.87–5.11)
RDW: 13.9 % (ref 11.5–15.5)
WBC: 7.7 10*3/uL (ref 4.0–10.5)

## 2019-02-21 LAB — URINALYSIS, ROUTINE W REFLEX MICROSCOPIC
Bilirubin Urine: NEGATIVE
Ketones, ur: NEGATIVE
Leukocytes,Ua: NEGATIVE
Nitrite: NEGATIVE
Specific Gravity, Urine: 1.025 (ref 1.000–1.030)
Total Protein, Urine: NEGATIVE
Urine Glucose: NEGATIVE
Urobilinogen, UA: 0.2 (ref 0.0–1.0)
WBC, UA: NONE SEEN (ref 0–?)
pH: 6 (ref 5.0–8.0)

## 2019-02-21 LAB — BASIC METABOLIC PANEL
BUN: 15 mg/dL (ref 6–23)
CO2: 22 mEq/L (ref 19–32)
Calcium: 9.3 mg/dL (ref 8.4–10.5)
Chloride: 104 mEq/L (ref 96–112)
Creatinine, Ser: 0.69 mg/dL (ref 0.40–1.20)
GFR: 107.39 mL/min (ref >60.00)
Glucose, Bld: 92 mg/dL (ref 70–99)
Potassium: 3.8 mEq/L (ref 3.5–5.1)
Sodium: 136 mEq/L (ref 135–145)

## 2019-02-21 LAB — HEPATIC FUNCTION PANEL
ALT: 24 U/L (ref 0–35)
AST: 24 U/L (ref 0–37)
Albumin: 4.4 g/dL (ref 3.5–5.2)
Alkaline Phosphatase: 79 U/L (ref 39–117)
Bilirubin, Direct: 0.1 mg/dL (ref 0.0–0.3)
Total Bilirubin: 0.6 mg/dL (ref 0.2–1.2)
Total Protein: 7.7 g/dL (ref 6.0–8.3)

## 2019-02-21 MED ORDER — AMLODIPINE BESYLATE 5 MG PO TABS: 5.0000 mg | ORAL_TABLET | Freq: Every day | ORAL | 1 refills | Status: DC

## 2019-02-21 MED ORDER — HYDROCODONE-ACETAMINOPHEN 5-325 MG PO TABS: 2.0000 | ORAL_TABLET | Freq: Four times a day (QID) | ORAL | 0 refills | Status: DC | PRN

## 2019-02-21 MED ORDER — MELOXICAM 15 MG PO TABS: 15.0000 mg | ORAL_TABLET | Freq: Every day | ORAL | 1 refills | Status: DC

## 2019-02-21 MED ORDER — INDAPAMIDE 2.5 MG PO TABS: 2.5000 mg | ORAL_TABLET | Freq: Every day | ORAL | 0 refills | Status: DC

## 2019-02-21 NOTE — Progress Notes (Signed)
Subjective:  Patient ID: Denise Morgan, female    DOB: 1965-09-08  Age: 53 y.o. MRN: [id number]  CC: Annual Exam and Hypertension  This visit occurred during the SARS-CoV-2 public health emergency.  Safety protocols were in place, including screening questions prior to the visit, additional usage of staff PPE, and extensive cleaning of exam room while observing appropriate contact time as indicated for disinfecting solutions.   HPI ELISCIA HARTIN presents for a CPX.  She does not monitor her BP. Based on RX's she would have run out of her antihypertensives months ago. She denies HA, BV, CP, DOE, edema, or fatigue.  She has been taking 2 of the mobic each day to control her MSK pain.  Outpatient Medications Prior to Visit  Medication Sig Dispense Refill  . albuterol (PROVENTIL HFA;VENTOLIN HFA) 108 (90 Base) MCG/ACT inhaler Inhale 2 puffs into the lungs every 4 (four) hours as needed for wheezing. 1 Inhaler 11  . methimazole (TAPAZOLE) 5 MG tablet TAKE 1 TABLET BY MOUTH EVERY DAY 90 tablet 0  . traZODone (DESYREL) 100 MG tablet TAKE 1 TABLET BY MOUTH EVERYDAY AT BEDTIME 90 tablet 1  . amLODipine (NORVASC) 5 MG tablet Take 1 tablet (5 mg total) by mouth daily. 90 tablet 1  . hydrochlorothiazide (MICROZIDE) 12.5 MG capsule Take 1 capsule (12.5 mg total) by mouth daily. 90 capsule 1  . HYDROcodone-acetaminophen (NORCO/VICODIN) 5-325 MG tablet Take 2 tablets by mouth every 6 (six) hours as needed for moderate pain. 90 tablet 0  . meloxicam (MOBIC) 7.5 MG tablet TAKE 1 TABLET BY MOUTH EVERY DAY 90 tablet 1  . methimazole (TAPAZOLE) 10 MG tablet 1 TABLET ALTERNATING WITH HALF TABLET (Patient not taking: Reported on 02/21/2019) 45 tablet 3   No facility-administered medications prior to visit.    ROS Review of Systems  Constitutional: Negative for diaphoresis, fatigue and unexpected weight change.  HENT: Negative.   Eyes: Negative.   Respiratory: Negative for apnea, cough, chest  tightness, shortness of breath and wheezing.   Cardiovascular: Negative for chest pain, palpitations and leg swelling.  Gastrointestinal: Negative for abdominal pain, constipation, diarrhea, nausea and vomiting.  Endocrine: Negative.  Negative for cold intolerance and heat intolerance.  Genitourinary: Negative.  Negative for difficulty urinating.  Musculoskeletal: Positive for arthralgias and back pain. Negative for myalgias.  Skin: Negative.   Neurological: Negative.  Negative for dizziness, weakness, light-headedness and headaches.  Hematological: Negative for adenopathy. Does not bruise/bleed easily.  Psychiatric/Behavioral: Negative.     Objective:  BP (!) 142/92 (BP Location: Left Arm, Patient Position: Sitting, Cuff Size: Large)   Pulse 79   Temp 98.2 F (36.8 C) (Oral)   Resp 16   Ht 5\' 6"  (1.676 m)   Wt 270 lb (122.5 kg)   LMP 03/01/2004   SpO2 97%   BMI 43.58 kg/m   BP Readings from Last 3 Encounters:  02/21/19 (!) 142/92  03/20/18 136/86  06/01/17 132/78    Wt Readings from Last 3 Encounters:  02/21/19 270 lb (122.5 kg)  03/20/18 274 lb (124.3 kg)  06/01/17 263 lb (119.3 kg)    Physical Exam Vitals reviewed.  Constitutional:      Appearance: Normal appearance. She is obese.  HENT:     Nose: Nose normal.     Mouth/Throat:     Mouth: Mucous membranes are moist.  Eyes:     General: No scleral icterus.    Conjunctiva/sclera: Conjunctivae normal.  Cardiovascular:     Rate and  Rhythm: Normal rate and regular rhythm.     Heart sounds: No murmur.  Pulmonary:     Effort: Pulmonary effort is normal.     Breath sounds: No stridor. No wheezing, rhonchi or rales.  Abdominal:     General: Abdomen is protuberant. Bowel sounds are normal. There is no distension.     Palpations: Abdomen is soft. There is no hepatomegaly or mass.     Tenderness: There is no abdominal tenderness.  Musculoskeletal:        General: Normal range of motion.     Cervical back: Neck  supple.     Right lower leg: No edema.     Left lower leg: No edema.  Lymphadenopathy:     Cervical: No cervical adenopathy.  Skin:    General: Skin is warm and dry.  Neurological:     General: No focal deficit present.     Mental Status: She is alert.  Psychiatric:        Mood and Affect: Mood normal.        Behavior: Behavior normal.     Lab Results  Component Value Date   WBC 7.7 02/21/2019   HGB 14.0 02/21/2019   HCT 41.7 02/21/2019   PLT 268.0 02/21/2019   GLUCOSE 92 02/21/2019   CHOL 253 (H) 02/21/2019   TRIG 73.0 02/21/2019   HDL 67.30 02/21/2019   LDLDIRECT 155.6 02/16/2013   LDLCALC 171 (H) 02/21/2019   ALT 24 02/21/2019   AST 24 02/21/2019   NA 136 02/21/2019   K 3.8 02/21/2019   CL 104 02/21/2019   CREATININE 0.69 02/21/2019   BUN 15 02/21/2019   CO2 22 02/21/2019   TSH 1.27 02/21/2019   INR 3.2 11/24/2012    US BREAST LTD UNI LEFT INC AXILLA  Result Date: 01/18/2019 CLINICAL DATA:  The patient was called back for a left breast mass. EXAM: DIGITAL DIAGNOSTIC LEFT MAMMOGRAM ULTRASOUND LEFT BREAST COMPARISON:  Previous exam(s). ACR Breast Density Category b: There are scattered areas of fibroglandular density. FINDINGS: The mass in the lateral left breast represents change mammographically and persists. On physical exam, no suspicious lumps are identified. Targeted ultrasound is performed, showing a simple cyst in the left breast at 3 o'clock accounting for the mammographic findings. IMPRESSION: Fibrocystic changes.  No evidence of malignancy. RECOMMENDATION: Annual screening mammography. I have discussed the findings and recommendations with the patient. If applicable, a reminder letter will be sent to the patient regarding the next appointment. BI-RADS CATEGORY  2: Benign. Electronically Signed   By: Dorise Bullion III M.D   On: 01/18/2019 10:23   MM DIAG BREAST TOMO UNI LEFT  Result Date: 01/18/2019 CLINICAL DATA:  The patient was called back for a left  breast mass. EXAM: DIGITAL DIAGNOSTIC LEFT MAMMOGRAM ULTRASOUND LEFT BREAST COMPARISON:  Previous exam(s). ACR Breast Density Category b: There are scattered areas of fibroglandular density. FINDINGS: The mass in the lateral left breast represents change mammographically and persists. On physical exam, no suspicious lumps are identified. Targeted ultrasound is performed, showing a simple cyst in the left breast at 3 o'clock accounting for the mammographic findings. IMPRESSION: Fibrocystic changes.  No evidence of malignancy. RECOMMENDATION: Annual screening mammography. I have discussed the findings and recommendations with the patient. If applicable, a reminder letter will be sent to the patient regarding the next appointment. BI-RADS CATEGORY  2: Benign. Electronically Signed   By: Dorise Bullion III M.D   On: 01/18/2019 10:23    Assessment &  Plan:   Casyn was seen today for annual exam and hypertension.  Diagnoses and all orders for this visit:  Essential hypertension- She has not achieved her BP goal of 130/80. Will restart the CCB and upgrade to a more potent thiazine diuretic. -     CBC with Differential -     Basic metabolic panel -     Urinalysis, Routine w reflex microscopic -     amLODipine (NORVASC) 5 MG tablet; Take 1 tablet (5 mg total) by mouth daily. -     indapamide (LOZOL) 2.5 MG tablet; Take 1 tablet (2.5 mg total) by mouth daily.  Routine general medical examination at a health care facility- Exam completed, labs reviewed, vaccines were reviewed, cancer screenings are UTD, pt ed was given. -     Lipid panel -     HIV antibody (Reflex)  Hyperlipidemia with target LDL less than 130- She does not have an elevated ASCVD risk score so I did not recommend she start a statin. -     Hepatic function panel  Encounter for long-term opiate analgesic use- Will check a UDS to monitor for compliance and screen for substance abuse.  -     Pain Mgmt, Profile 8 w/Conf,  U  Hyperthyroidism- Her TFT's are normal now. -     Thyroid Panel With TSH; Future -     Thyroid Panel With TSH  Primary osteoarthritis of both knees -     meloxicam (MOBIC) 15 MG tablet; Take 1 tablet (15 mg total) by mouth daily. -     HYDROcodone-acetaminophen (NORCO/VICODIN) 5-325 MG tablet; Take 2 tablets by mouth every 6 (six) hours as needed for moderate pain.  Chronic bilateral low back pain without sciatica -     meloxicam (MOBIC) 15 MG tablet; Take 1 tablet (15 mg total) by mouth daily. -     HYDROcodone-acetaminophen (NORCO/VICODIN) 5-325 MG tablet; Take 2 tablets by mouth every 6 (six) hours as needed for moderate pain.  Chronic pain syndrome -     HYDROcodone-acetaminophen (NORCO/VICODIN) 5-325 MG tablet; Take 2 tablets by mouth every 6 (six) hours as needed for moderate pain.  Primary osteoarthritis of both hips -     HYDROcodone-acetaminophen (NORCO/VICODIN) 5-325 MG tablet; Take 2 tablets by mouth every 6 (six) hours as needed for moderate pain.   I have discontinued Zoraida R. Merriweather's hydrochlorothiazide and meloxicam. I am also having her start on meloxicam and indapamide. Additionally, I am having her maintain her albuterol, methimazole, traZODone, methimazole, amLODipine, and HYDROcodone-acetaminophen.  Meds ordered this encounter  Medications  . meloxicam (MOBIC) 15 MG tablet    Sig: Take 1 tablet (15 mg total) by mouth daily.    Dispense:  90 tablet    Refill:  1  . amLODipine (NORVASC) 5 MG tablet    Sig: Take 1 tablet (5 mg total) by mouth daily.    Dispense:  90 tablet    Refill:  1  . indapamide (LOZOL) 2.5 MG tablet    Sig: Take 1 tablet (2.5 mg total) by mouth daily.    Dispense:  90 tablet    Refill:  0  . HYDROcodone-acetaminophen (NORCO/VICODIN) 5-325 MG tablet    Sig: Take 2 tablets by mouth every 6 (six) hours as needed for moderate pain.    Dispense:  90 tablet    Refill:  0     Follow-up: Return in about 3 months (around  05/22/2019).  Scarlette Calico, MD

## 2019-02-21 NOTE — Patient Instructions (Signed)

## 2019-02-22 ENCOUNTER — Telehealth: Payer: Self-pay | Admitting: *Deleted

## 2019-02-22 ENCOUNTER — Encounter: Payer: Self-pay | Admitting: Internal Medicine

## 2019-02-22 LAB — THYROID PANEL WITH TSH
Free Thyroxine Index: 3 (ref 1.4–3.8)
T3 Uptake: 31 % (ref 22–35)
T4, Total: 9.7 ug/dL (ref 5.1–11.9)
TSH: 1.27 mIU/L

## 2019-02-22 LAB — HIV ANTIBODY (ROUTINE TESTING W REFLEX): HIV 1&2 Ab, 4th Generation: NONREACTIVE

## 2019-02-22 NOTE — Telephone Encounter (Signed)
I have called and spoke with Tiffany at [facility] & she is going to fax over the additional lab report. Some labs are still pending.  She also gave me a verbal- TSH: 1.27

## 2019-02-24 LAB — PAIN MGMT, PROFILE 8 W/CONF, U
6 Acetylmorphine: NEGATIVE ng/mL
Alcohol Metabolites: POSITIVE ng/mL — AB (ref <500)
Amphetamines: NEGATIVE ng/mL
Benzodiazepines: NEGATIVE ng/mL
Buprenorphine, Urine: NEGATIVE ng/mL
Cocaine Metabolite: NEGATIVE ng/mL
Codeine: NEGATIVE ng/mL
Creatinine: 205 mg/dL
Ethyl Glucuronide (ETG): 22661 ng/mL
Ethyl Sulfate (ETS): 5841 ng/mL
Hydrocodone: NEGATIVE ng/mL
Hydromorphone: NEGATIVE ng/mL
MDMA: NEGATIVE ng/mL
Marijuana Metabolite: NEGATIVE ng/mL
Morphine: NEGATIVE ng/mL
Norhydrocodone: NEGATIVE ng/mL
Opiates: NEGATIVE ng/mL
Oxidant: NEGATIVE ug/mL
Oxycodone: NEGATIVE ng/mL
pH: 6 (ref 4.5–9.0)

## 2019-02-26 ENCOUNTER — Encounter: Payer: Self-pay | Admitting: Internal Medicine

## 2019-03-04 ENCOUNTER — Other Ambulatory Visit: Payer: Self-pay

## 2019-03-04 ENCOUNTER — Encounter (HOSPITAL_COMMUNITY): Payer: Self-pay

## 2019-03-04 ENCOUNTER — Emergency Department (HOSPITAL_COMMUNITY)
Admission: EM | Admit: 2019-03-04 | Discharge: 2019-03-04 | Disposition: A | Discharge status: Home / Self Care | Payer: 59 | Attending: Emergency Medicine | Admitting: Emergency Medicine

## 2019-03-04 DIAGNOSIS — Z87891 Personal history of nicotine dependence: Secondary | ICD-10-CM | POA: Insufficient documentation

## 2019-03-04 DIAGNOSIS — R040 Epistaxis: Secondary | ICD-10-CM | POA: Diagnosis present

## 2019-03-04 DIAGNOSIS — I10 Essential (primary) hypertension: Secondary | ICD-10-CM | POA: Diagnosis not present

## 2019-03-04 DIAGNOSIS — Z79899 Other long term (current) drug therapy: Secondary | ICD-10-CM | POA: Diagnosis not present

## 2019-03-04 DIAGNOSIS — J4520 Mild intermittent asthma, uncomplicated: Secondary | ICD-10-CM | POA: Insufficient documentation

## 2019-03-04 MED ORDER — OXYMETAZOLINE HCL 0.05 % NA SOLN
1.0000 | Freq: Once | NASAL | Status: AC
  Administered 2019-03-04: 10:00:00 1 via NASAL
  Filled 2019-03-04: qty 30

## 2019-03-04 NOTE — Discharge Instructions (Addendum)
°  Should rebleeding occur, apply pressure to either side of the nose, blocking blood from exiting in the nostrils, consistently for 15 minutes.   If bleeding continues, reapply pressure for another 10 to 15 minutes. If bleeding still continues, may try sprays of Afrin nasal spray. Avoid blowing your nose and do not stick anything into the nostrils, including your finger. Please follow-up with the ear nose and throat specialist.  Call to make an appointment. Return to the ED for persistent bleeding.

## 2019-03-04 NOTE — ED Provider Notes (Signed)
[facility] EMERGENCY DEPARTMENT Provider Note   CSN: [id number] Arrival date & time: 03/04/19  [id number]     History Chief Complaint  Patient presents with  . Epistaxis    Denise Morgan is a 54 y.o. female.  HPI      Denise Morgan is a 54 y.o. female, with a history of asthma, PE, and anemia, presenting to the ED with epistaxis beginning last night.  Patient states she has had intermittent difficulty with nosebleeds that arise especially in winter.  Her bleeding will start typically after blowing her nose or wiping her nose.  She states her nose has recently felt dry. She notes she has had bleeding intermittently throughout the night, but it resolved spontaneously when she came to the ED. She has been previously seen by ENT years ago for nosebleeds, was told she may need further intervention if her nosebleeds keep bothering her, but she states she does not have them frequently enough to give her concern. She states though she has a history of anemia this has not been a recent issue.  Denies fever/chills, shortness of breath, facial swelling, nasal pain, facial pain, facial or nasal trauma, dizziness, headaches, vision changes, chest pain, shortness of breath, or any other complaints.    Past Medical History:  Diagnosis Date  . Allergy   . Anemia   . Arthritis   . Asthma   . Blood transfusion without reported diagnosis   . Depression   . Frequent headaches   . History of phlebitis   . Hx of adenomatous polyp of colon 08/15/2015  . Palpitations    heart monitor all normal per pt.2017  . PE (pulmonary embolism)     Patient Active Problem List   Diagnosis Date Noted  . Encounter for long-term opiate analgesic use 03/20/2018  . Hyperthyroidism 04/21/2016  . Essential hypertension 04/07/2016  . Snoring 04/07/2016  . Hx of adenomatous polyp of colon 08/15/2015  . Asthma, mild intermittent 05/29/2015  . Hyperlipidemia with target LDL less than 130  05/29/2015  . Subacromial bursitis 04/18/2014  . Obesity, morbid ([place]) 02/16/2013  . Routine general medical examination at a health care facility 02/16/2013  . Back pain, chronic 02/16/2013  . Degenerative joint disease (DJD) of hip 02/16/2013  . Tobacco abuse disorder 11/19/2012  . Visit for screening mammogram 11/17/2012  . DJD (degenerative joint disease) of knee 11/17/2012  . Chronic pain syndrome 11/17/2012    Past Surgical History:  Procedure Laterality Date  . ABDOMINAL HYSTERECTOMY  03/01/2004  . CHOLECYSTECTOMY       OB History   No obstetric history on file.     Family History  Problem Relation Age of Onset  . Colon polyps Mother   . Ovarian cancer Mother   . Arthritis Mother   . Colon cancer Maternal Aunt   . Esophageal cancer Neg Hx   . Rectal cancer Neg Hx   . Stomach cancer Neg Hx     Social History   Tobacco Use  . Smoking status: Former Smoker    Packs/day: 0.50    Years: 30.00    Pack years: 15.00    Types: Cigarettes    Quit date: 08/22/2015    Years since quitting: 3.5  . Smokeless tobacco: Never Used  Substance Use Topics  . Alcohol use: Yes    Alcohol/week: 7.0 standard drinks    Types: 2 Glasses of wine, 5 Shots of liquor per week    Comment: social  .  Drug use: No    Home Medications Prior to Admission medications   Medication Sig Start Date End Date Taking? Authorizing Provider  albuterol (PROVENTIL HFA;VENTOLIN HFA) 108 (90 Base) MCG/ACT inhaler Inhale 2 puffs into the lungs every 4 (four) hours as needed for wheezing. 05/29/15   Janith Lima, MD  amLODipine (NORVASC) 5 MG tablet Take 1 tablet (5 mg total) by mouth daily. 02/21/19   Janith Lima, MD  HYDROcodone-acetaminophen (NORCO/VICODIN) 5-325 MG tablet Take 2 tablets by mouth every 6 (six) hours as needed for moderate pain. 02/21/19   Janith Lima, MD  indapamide (LOZOL) 2.5 MG tablet Take 1 tablet (2.5 mg total) by mouth daily. 02/21/19   Janith Lima, MD  meloxicam  (MOBIC) 15 MG tablet Take 1 tablet (15 mg total) by mouth daily. 02/21/19   Janith Lima, MD  methimazole (TAPAZOLE) 10 MG tablet 1 TABLET ALTERNATING WITH HALF TABLET Patient not taking: Reported on 02/21/2019 02/20/19   Elayne Snare, MD  methimazole (TAPAZOLE) 5 MG tablet TAKE 1 TABLET BY MOUTH EVERY DAY 08/31/17   Elayne Snare, MD  traZODone (DESYREL) 100 MG tablet TAKE 1 TABLET BY MOUTH EVERYDAY AT BEDTIME 02/28/19   Janith Lima, MD    Allergies    Latex  Review of Systems   Review of Systems  Constitutional: Negative for chills, diaphoresis and fever.  HENT: Positive for nosebleeds. Negative for drooling, facial swelling, sinus pressure, sinus pain, sore throat and trouble swallowing.   Eyes: Negative for visual disturbance.  Respiratory: Negative for cough and shortness of breath.   Cardiovascular: Negative for chest pain.  Neurological: Negative for dizziness, syncope, light-headedness and headaches.  All other systems reviewed and are negative.   Physical Exam Updated Vital Signs BP (!) 133/94 (BP Location: Left Arm)   Pulse 79   Temp (!) 97.5 F (36.4 C) (Oral)   Resp 19   LMP 03/01/2004   SpO2 100%   Physical Exam Vitals and nursing note reviewed.  Constitutional:      General: She is not in acute distress.    Appearance: She is well-developed. She is not diaphoretic.  HENT:     Head: Normocephalic and atraumatic.     Nose:     Comments: Some dried blood in the left nare with some prominence to the nasal mucosa.  No septal hematomas noted.  No active hemorrhage noted. No noted tenderness, swelling, color change, or deformity to the nose or face. No pain with EOMs.    Mouth/Throat:     Mouth: Mucous membranes are moist.     Pharynx: Oropharynx is clear.     Comments: No noted blood in the mouth.  Handles oral secretions without noted difficulty.  No phonation abnormalities.  No noted difficulties with speech. Eyes:     Conjunctiva/sclera: Conjunctivae  normal.  Cardiovascular:     Rate and Rhythm: Normal rate and regular rhythm.     Pulses: Normal pulses.          Radial pulses are 2+ on the right side and 2+ on the left side.       Posterior tibial pulses are 2+ on the right side and 2+ on the left side.     Comments: Tactile temperature in the extremities appropriate and equal bilaterally. Pulmonary:     Effort: Pulmonary effort is normal. No respiratory distress.  Abdominal:     Tenderness: There is no guarding.  Musculoskeletal:     Cervical back: Neck  supple.     Right lower leg: No edema.     Left lower leg: No edema.  Lymphadenopathy:     Cervical: No cervical adenopathy.  Skin:    General: Skin is warm and dry.  Neurological:     Mental Status: She is alert.     Comments: No noted acute cognitive deficit. Sensation grossly intact to light touch in the extremities.   Grip strengths equal bilaterally.   Strength 5/5 in all extremities.  No gait disturbance.  Coordination intact.  Cranial nerves III-XII grossly intact.  Handles oral secretions without noted difficulty.  No noted phonation or speech deficit. No facial droop.   Psychiatric:        Mood and Affect: Mood and affect normal.        Speech: Speech normal.        Behavior: Behavior normal.     ED Results / Procedures / Treatments   Labs (all labs ordered are listed, but only abnormal results are displayed) Labs Reviewed - No data to display  EKG None  Radiology No results found.  Procedures Procedures (including critical care time)  Medications Ordered in ED Medications  oxymetazoline (AFRIN) 0.05 % nasal spray 1 spray (1 spray Each Nare Given 03/04/19 1006)    ED Course  I have reviewed the triage vital signs and the nursing notes.  Pertinent labs & imaging results that were available during my care of the patient were reviewed by me and considered in my medical decision making (see chart for details).    MDM Rules/Calculators/A&P                       Patient presents with epistaxis.  This resolved prior to my assessment of the patient. We discussed evaluating the patient's hemoglobin today with explanation, but patient declined. Hemoglobin on February 21, 2019 was 14.0. Patient to follow-up with ENT on this matter. The patient was given instructions for home care as well as return precautions. Patient voices understanding of these instructions, accepts the plan, and is comfortable with discharge.  Final Clinical Impression(s) / ED Diagnoses Final diagnoses:  Epistaxis    Rx / DC Orders ED Discharge Orders    None       Layla Maw 03/04/19 1140    Virgel Manifold, MD 03/04/19 1307

## 2019-03-04 NOTE — ED Triage Notes (Signed)
Pt states that her nose has been bleeding since yesterday, denies thinners, not bleeding at this time

## 2019-04-01 ENCOUNTER — Other Ambulatory Visit: Payer: Self-pay | Admitting: Internal Medicine

## 2019-04-01 DIAGNOSIS — G8929 Other chronic pain: Secondary | ICD-10-CM

## 2019-04-01 DIAGNOSIS — M160 Bilateral primary osteoarthritis of hip: Secondary | ICD-10-CM

## 2019-04-01 DIAGNOSIS — M170 Bilateral primary osteoarthritis of knee: Secondary | ICD-10-CM

## 2019-04-01 DIAGNOSIS — M545 Low back pain, unspecified: Secondary | ICD-10-CM

## 2019-04-01 DIAGNOSIS — G894 Chronic pain syndrome: Secondary | ICD-10-CM

## 2019-04-02 ENCOUNTER — Other Ambulatory Visit: Payer: Self-pay | Admitting: Internal Medicine

## 2019-04-02 DIAGNOSIS — M160 Bilateral primary osteoarthritis of hip: Secondary | ICD-10-CM

## 2019-04-02 DIAGNOSIS — G8929 Other chronic pain: Secondary | ICD-10-CM

## 2019-04-02 DIAGNOSIS — M170 Bilateral primary osteoarthritis of knee: Secondary | ICD-10-CM

## 2019-04-02 DIAGNOSIS — G894 Chronic pain syndrome: Secondary | ICD-10-CM

## 2019-04-02 MED ORDER — HYDROCODONE-ACETAMINOPHEN 5-325 MG PO TABS: 2.0000 | ORAL_TABLET | Freq: Four times a day (QID) | ORAL | 0 refills | Status: DC | PRN

## 2019-04-06 ENCOUNTER — Telehealth: Payer: Self-pay

## 2019-04-06 NOTE — Telephone Encounter (Signed)
Key: [id number]

## 2019-05-12 ENCOUNTER — Other Ambulatory Visit: Payer: Self-pay | Admitting: Internal Medicine

## 2019-05-12 DIAGNOSIS — I10 Essential (primary) hypertension: Secondary | ICD-10-CM

## 2019-05-22 ENCOUNTER — Ambulatory Visit: Payer: BC Managed Care – PPO | Admitting: Internal Medicine

## 2019-05-22 ENCOUNTER — Ambulatory Visit (INDEPENDENT_AMBULATORY_CARE_PROVIDER_SITE_OTHER): Payer: 59

## 2019-05-22 ENCOUNTER — Other Ambulatory Visit: Payer: Self-pay

## 2019-05-22 ENCOUNTER — Ambulatory Visit (INDEPENDENT_AMBULATORY_CARE_PROVIDER_SITE_OTHER): Payer: 59 | Admitting: Internal Medicine

## 2019-05-22 ENCOUNTER — Encounter: Payer: Self-pay | Admitting: Internal Medicine

## 2019-05-22 ENCOUNTER — Other Ambulatory Visit: Payer: Self-pay | Admitting: Internal Medicine

## 2019-05-22 VITALS — BP 138/80 | HR 77 | Temp 98.3°F | Ht 66.0 in | Wt 264.0 lb

## 2019-05-22 DIAGNOSIS — I10 Essential (primary) hypertension: Secondary | ICD-10-CM

## 2019-05-22 DIAGNOSIS — M25552 Pain in left hip: Secondary | ICD-10-CM

## 2019-05-22 DIAGNOSIS — M545 Low back pain: Secondary | ICD-10-CM

## 2019-05-22 DIAGNOSIS — M170 Bilateral primary osteoarthritis of knee: Secondary | ICD-10-CM | POA: Diagnosis not present

## 2019-05-22 DIAGNOSIS — E785 Hyperlipidemia, unspecified: Secondary | ICD-10-CM

## 2019-05-22 DIAGNOSIS — M160 Bilateral primary osteoarthritis of hip: Secondary | ICD-10-CM

## 2019-05-22 DIAGNOSIS — G894 Chronic pain syndrome: Secondary | ICD-10-CM

## 2019-05-22 DIAGNOSIS — G8929 Other chronic pain: Secondary | ICD-10-CM | POA: Insufficient documentation

## 2019-05-22 MED ORDER — HYDROCODONE-ACETAMINOPHEN 5-325 MG PO TABS: 2.0000 | ORAL_TABLET | Freq: Four times a day (QID) | ORAL | 0 refills | Status: DC | PRN

## 2019-05-22 NOTE — Progress Notes (Signed)
Subjective:  Patient ID: Denise Morgan, female    DOB: 09/03/1965  Age: 54 y.o. MRN: [id number]  CC: Hypertension and Osteoarthritis  This visit occurred during the SARS-CoV-2 public health emergency.  Safety protocols were in place, including screening questions prior to the visit, additional usage of staff PPE, and extensive cleaning of exam room while observing appropriate contact time as indicated for disinfecting solutions.    HPI Denise Morgan presents for f/up - She has her usual degree of musculoskeletal pain but for the last week or 2 she has had pain in her left hip that is more prominent than usual.  She denies any trauma or injury.  She is getting adequate symptom relief with hydrocodone, acetaminophen, and meloxicam.  Outpatient Medications Prior to Visit  Medication Sig Dispense Refill  . albuterol (PROVENTIL HFA;VENTOLIN HFA) 108 (90 Base) MCG/ACT inhaler Inhale 2 puffs into the lungs every 4 (four) hours as needed for wheezing. 1 Inhaler 11  . amLODipine (NORVASC) 5 MG tablet Take 1 tablet (5 mg total) by mouth daily. 90 tablet 1  . indapamide (LOZOL) 2.5 MG tablet TAKE 1 TABLET BY MOUTH EVERY DAY 90 tablet 0  . meloxicam (MOBIC) 15 MG tablet Take 1 tablet (15 mg total) by mouth daily. 90 tablet 1  . traZODone (DESYREL) 100 MG tablet TAKE 1 TABLET BY MOUTH EVERYDAY AT BEDTIME 90 tablet 1  . HYDROcodone-acetaminophen (NORCO/VICODIN) 5-325 MG tablet Take 2 tablets by mouth every 6 (six) hours as needed for moderate pain. 90 tablet 0  . methimazole (TAPAZOLE) 10 MG tablet 1 TABLET ALTERNATING WITH HALF TABLET (Patient not taking: Reported on 02/21/2019) 45 tablet 3  . methimazole (TAPAZOLE) 5 MG tablet TAKE 1 TABLET BY MOUTH EVERY DAY 90 tablet 0   No facility-administered medications prior to visit.    ROS Review of Systems  Constitutional: Negative.  Negative for diaphoresis, fatigue and unexpected weight change.  HENT: Negative.   Eyes: Negative.   Respiratory:  Negative.  Negative for cough, chest tightness, shortness of breath and wheezing.   Cardiovascular: Negative for chest pain, palpitations and leg swelling.  Gastrointestinal: Negative for abdominal pain, constipation, diarrhea, nausea and vomiting.  Endocrine: Negative.   Genitourinary: Negative for difficulty urinating.  Musculoskeletal: Positive for arthralgias and back pain. Negative for myalgias and neck pain.  Skin: Negative.  Negative for color change, pallor and rash.  Neurological: Negative.  Negative for dizziness, weakness and light-headedness.  Hematological: Negative for adenopathy. Does not bruise/bleed easily.  Psychiatric/Behavioral: Negative.     Objective:  BP 138/80 (BP Location: Left Arm, Patient Position: Sitting, Cuff Size: Large)   Pulse 77   Temp 98.3 F (36.8 C) (Oral)   Ht 5\' 6"  (1.676 m)   Wt 264 lb (119.7 kg)   LMP 03/01/2004   SpO2 97%   BMI 42.61 kg/m   BP Readings from Last 3 Encounters:  05/22/19 138/80  03/04/19 (!) 133/94  02/21/19 (!) 142/92    Wt Readings from Last 3 Encounters:  05/22/19 264 lb (119.7 kg)  02/21/19 270 lb (122.5 kg)  03/20/18 274 lb (124.3 kg)    Physical Exam Vitals reviewed.  Constitutional:      Appearance: She is obese.  HENT:     Nose: Nose normal.     Mouth/Throat:     Mouth: Mucous membranes are moist.  Eyes:     General: No scleral icterus.    Conjunctiva/sclera: Conjunctivae normal.  Cardiovascular:     Rate and  Rhythm: Normal rate and regular rhythm.     Heart sounds: No murmur.  Pulmonary:     Effort: Pulmonary effort is normal.     Breath sounds: No stridor. No wheezing, rhonchi or rales.  Abdominal:     General: Abdomen is protuberant. Bowel sounds are normal. There is no distension.     Palpations: Abdomen is soft. There is no hepatomegaly, splenomegaly or mass.     Tenderness: There is no abdominal tenderness.  Musculoskeletal:        General: Normal range of motion.     Cervical back: Neck  supple.     Right hip: Normal.     Left hip: Normal. No deformity, tenderness, bony tenderness or crepitus. Normal range of motion.     Right lower leg: No edema.     Left lower leg: No edema.  Lymphadenopathy:     Cervical: No cervical adenopathy.  Skin:    General: Skin is warm and dry.  Neurological:     General: No focal deficit present.     Mental Status: She is alert.     Lab Results  Component Value Date   WBC 7.7 02/21/2019   HGB 14.0 02/21/2019   HCT 41.7 02/21/2019   PLT 268.0 02/21/2019   GLUCOSE 92 02/21/2019   CHOL 253 (H) 02/21/2019   TRIG 73.0 02/21/2019   HDL 67.30 02/21/2019   LDLDIRECT 155.6 02/16/2013   LDLCALC 171 (H) 02/21/2019   ALT 24 02/21/2019   AST 24 02/21/2019   NA 136 02/21/2019   K 3.8 02/21/2019   CL 104 02/21/2019   CREATININE 0.69 02/21/2019   BUN 15 02/21/2019   CO2 22 02/21/2019   TSH 1.27 02/21/2019   INR 3.2 11/24/2012    DG HIP UNILAT WITH PELVIS 2-3 VIEWS LEFT  Result Date: 05/23/2019 CLINICAL DATA:  Hip pain EXAM: DG HIP (WITH OR WITHOUT PELVIS) 2-3V LEFT COMPARISON:  2014 FINDINGS: Alignment is anatomic. No acute fracture. Mild degenerative changes are present without substantial progression since 2014. IMPRESSION: Mild degenerative changes at the left hip without substantial change since 2014. Electronically Signed   By: Macy Mis M.D.   On: 05/23/2019 09:01    Assessment & Plan:   Denise Morgan was seen today for hypertension and osteoarthritis.  Diagnoses and all orders for this visit:  Essential hypertension- Her BP is adequately well controlled.  Hip pain, acute, left- Xray is positive for DJD, will cont the current meds -     Cancel: DG HIP UNILAT WITH PELVIS MIN 4 VIEWS LEFT; Future  Hyperlipidemia with target LDL less than 130  Primary osteoarthritis of both knees -     HYDROcodone-acetaminophen (NORCO/VICODIN) 5-325 MG tablet; Take 2 tablets by mouth every 6 (six) hours as needed for moderate pain.  Chronic  bilateral low back pain without sciatica -     HYDROcodone-acetaminophen (NORCO/VICODIN) 5-325 MG tablet; Take 2 tablets by mouth every 6 (six) hours as needed for moderate pain.  Chronic pain syndrome -     HYDROcodone-acetaminophen (NORCO/VICODIN) 5-325 MG tablet; Take 2 tablets by mouth every 6 (six) hours as needed for moderate pain.  Primary osteoarthritis of both hips -     HYDROcodone-acetaminophen (NORCO/VICODIN) 5-325 MG tablet; Take 2 tablets by mouth every 6 (six) hours as needed for moderate pain.   I have discontinued Denise Morgan's methimazole and methimazole. I am also having her maintain her albuterol, traZODone, meloxicam, amLODipine, indapamide, and HYDROcodone-acetaminophen.  Meds ordered this encounter  Medications  . HYDROcodone-acetaminophen (NORCO/VICODIN) 5-325 MG tablet    Sig: Take 2 tablets by mouth every 6 (six) hours as needed for moderate pain.    Dispense:  90 tablet    Refill:  0     Follow-up: Return in about 6 weeks (around 07/03/2019).  Scarlette Calico, MD

## 2019-05-22 NOTE — Patient Instructions (Signed)
Hip Pain The hip is the joint between the upper legs and the lower pelvis. The bones, cartilage, tendons, and muscles of your hip joint support your body and allow you to move around. Hip pain can range from a minor ache to severe pain in one or both of your hips. The pain may be felt on the inside of the hip joint near the groin, or on the outside near the buttocks and upper thigh. You may also have swelling or stiffness in your hip area. Follow these instructions at home: Managing pain, stiffness, and swelling      If directed, put ice on the painful area. To do this: ? Put ice in a plastic bag. ? Place a towel between your skin and the bag. ? Leave the ice on for 20 minutes, 2-3 times a day.  If directed, apply heat to the affected area as often as told by your health care provider. Use the heat source that your health care provider recommends, such as a moist heat pack or a heating pad. ? Place a towel between your skin and the heat source. ? Leave the heat on for 20-30 minutes. ? Remove the heat if your skin turns bright red. This is especially important if you are unable to feel pain, heat, or cold. You may have a greater risk of getting burned. Activity  Do exercises as told by your health care provider.  Avoid activities that cause pain. General instructions   Take over-the-counter and prescription medicines only as told by your health care provider.  Keep a journal of your symptoms. Write down: ? How often you have hip pain. ? The location of your pain. ? What the pain feels like. ? What makes the pain worse.  Sleep with a pillow between your legs on your most comfortable side.  Keep all follow-up visits as told by your health care provider. This is important. Contact a health care provider if:  You cannot put weight on your leg.  Your pain or swelling continues or gets worse after one week.  It gets harder to walk.  You have a fever. Get help right away  if:  You fall.  You have a sudden increase in pain and swelling in your hip.  Your hip is red or swollen or very tender to touch. Summary  Hip pain can range from a minor ache to severe pain in one or both of your hips.  The pain may be felt on the inside of the hip joint near the groin, or on the outside near the buttocks and upper thigh.  Avoid activities that cause pain.  Write down how often you have hip pain, the location of the pain, what makes it worse, and what it feels like. This information is not intended to replace advice given to you by your health care provider. Make sure you discuss any questions you have with your health care provider. Document Revised: 07/03/2018 Document Reviewed: 07/03/2018 Elsevier Patient Education  2020 [employer] -- 

## 2019-05-23 ENCOUNTER — Encounter: Payer: Self-pay | Admitting: Internal Medicine

## 2019-07-10 ENCOUNTER — Other Ambulatory Visit: Payer: Self-pay | Admitting: Internal Medicine

## 2019-07-10 DIAGNOSIS — M160 Bilateral primary osteoarthritis of hip: Secondary | ICD-10-CM

## 2019-07-10 DIAGNOSIS — G8929 Other chronic pain: Secondary | ICD-10-CM

## 2019-07-10 DIAGNOSIS — M545 Low back pain, unspecified: Secondary | ICD-10-CM

## 2019-07-10 DIAGNOSIS — M170 Bilateral primary osteoarthritis of knee: Secondary | ICD-10-CM

## 2019-07-10 DIAGNOSIS — G894 Chronic pain syndrome: Secondary | ICD-10-CM

## 2019-07-10 MED ORDER — HYDROCODONE-ACETAMINOPHEN 5-325 MG PO TABS: 2.0000 | ORAL_TABLET | Freq: Four times a day (QID) | ORAL | 0 refills | Status: DC | PRN

## 2019-07-10 NOTE — Telephone Encounter (Signed)
Check [place] registry last filled 05/22/2019.MD is out of the office this week pls advise on refill...Denise Morgan

## 2019-07-10 NOTE — Telephone Encounter (Signed)
Done erx 

## 2019-08-09 ENCOUNTER — Other Ambulatory Visit: Payer: Self-pay | Admitting: Internal Medicine

## 2019-08-09 DIAGNOSIS — I10 Essential (primary) hypertension: Secondary | ICD-10-CM

## 2019-08-09 DIAGNOSIS — M170 Bilateral primary osteoarthritis of knee: Secondary | ICD-10-CM

## 2019-08-09 DIAGNOSIS — G894 Chronic pain syndrome: Secondary | ICD-10-CM

## 2019-08-09 DIAGNOSIS — M160 Bilateral primary osteoarthritis of hip: Secondary | ICD-10-CM

## 2019-08-09 DIAGNOSIS — G8929 Other chronic pain: Secondary | ICD-10-CM

## 2019-08-09 MED ORDER — HYDROCODONE-ACETAMINOPHEN 5-325 MG PO TABS: 2.0000 | ORAL_TABLET | Freq: Four times a day (QID) | ORAL | 0 refills | Status: DC | PRN

## 2019-08-09 NOTE — Telephone Encounter (Signed)
Check [place] registry last filled 07/10/2019../lmb ° °

## 2019-08-16 ENCOUNTER — Other Ambulatory Visit: Payer: Self-pay | Admitting: Internal Medicine

## 2019-08-16 DIAGNOSIS — M170 Bilateral primary osteoarthritis of knee: Secondary | ICD-10-CM

## 2019-08-16 DIAGNOSIS — G8929 Other chronic pain: Secondary | ICD-10-CM

## 2019-08-26 ENCOUNTER — Other Ambulatory Visit: Payer: Self-pay | Admitting: Internal Medicine

## 2019-08-26 DIAGNOSIS — G894 Chronic pain syndrome: Secondary | ICD-10-CM

## 2019-10-05 ENCOUNTER — Other Ambulatory Visit: Payer: Self-pay | Admitting: Internal Medicine

## 2019-10-05 DIAGNOSIS — G8929 Other chronic pain: Secondary | ICD-10-CM

## 2019-10-05 DIAGNOSIS — M160 Bilateral primary osteoarthritis of hip: Secondary | ICD-10-CM

## 2019-10-05 DIAGNOSIS — M545 Low back pain, unspecified: Secondary | ICD-10-CM

## 2019-10-05 DIAGNOSIS — M170 Bilateral primary osteoarthritis of knee: Secondary | ICD-10-CM

## 2019-10-05 DIAGNOSIS — G894 Chronic pain syndrome: Secondary | ICD-10-CM

## 2019-10-06 MED ORDER — HYDROCODONE-ACETAMINOPHEN 5-325 MG PO TABS: 2.0000 | ORAL_TABLET | Freq: Four times a day (QID) | ORAL | 0 refills | Status: DC | PRN

## 2019-11-01 ENCOUNTER — Other Ambulatory Visit: Payer: Self-pay | Admitting: Internal Medicine

## 2019-11-01 DIAGNOSIS — I10 Essential (primary) hypertension: Secondary | ICD-10-CM

## 2019-11-07 ENCOUNTER — Other Ambulatory Visit: Payer: Self-pay | Admitting: Internal Medicine

## 2019-11-07 DIAGNOSIS — M170 Bilateral primary osteoarthritis of knee: Secondary | ICD-10-CM

## 2019-11-07 DIAGNOSIS — G894 Chronic pain syndrome: Secondary | ICD-10-CM

## 2019-11-07 DIAGNOSIS — M160 Bilateral primary osteoarthritis of hip: Secondary | ICD-10-CM

## 2019-11-07 DIAGNOSIS — G8929 Other chronic pain: Secondary | ICD-10-CM

## 2019-11-07 MED ORDER — HYDROCODONE-ACETAMINOPHEN 5-325 MG PO TABS: 2.0000 | ORAL_TABLET | Freq: Four times a day (QID) | ORAL | 0 refills | Status: DC | PRN

## 2019-12-06 ENCOUNTER — Other Ambulatory Visit: Payer: Self-pay | Admitting: Internal Medicine

## 2019-12-06 DIAGNOSIS — M160 Bilateral primary osteoarthritis of hip: Secondary | ICD-10-CM

## 2019-12-06 DIAGNOSIS — G8929 Other chronic pain: Secondary | ICD-10-CM

## 2019-12-06 DIAGNOSIS — M170 Bilateral primary osteoarthritis of knee: Secondary | ICD-10-CM

## 2019-12-06 DIAGNOSIS — G894 Chronic pain syndrome: Secondary | ICD-10-CM

## 2019-12-06 DIAGNOSIS — M545 Low back pain, unspecified: Secondary | ICD-10-CM

## 2019-12-06 NOTE — Telephone Encounter (Signed)
Check [place] registry last filled 11/07/2019.Marland KitchenJohny Morgan

## 2019-12-07 ENCOUNTER — Other Ambulatory Visit: Payer: Self-pay | Admitting: Internal Medicine

## 2019-12-07 DIAGNOSIS — G894 Chronic pain syndrome: Secondary | ICD-10-CM

## 2019-12-07 DIAGNOSIS — M170 Bilateral primary osteoarthritis of knee: Secondary | ICD-10-CM

## 2019-12-07 DIAGNOSIS — M160 Bilateral primary osteoarthritis of hip: Secondary | ICD-10-CM

## 2019-12-07 DIAGNOSIS — G8929 Other chronic pain: Secondary | ICD-10-CM

## 2019-12-07 MED ORDER — HYDROCODONE-ACETAMINOPHEN 5-325 MG PO TABS: 2.0000 | ORAL_TABLET | Freq: Four times a day (QID) | ORAL | 0 refills | Status: DC | PRN

## 2020-01-06 ENCOUNTER — Other Ambulatory Visit: Payer: Self-pay | Admitting: Internal Medicine

## 2020-01-06 DIAGNOSIS — M545 Low back pain, unspecified: Secondary | ICD-10-CM

## 2020-01-06 DIAGNOSIS — M160 Bilateral primary osteoarthritis of hip: Secondary | ICD-10-CM

## 2020-01-06 DIAGNOSIS — M170 Bilateral primary osteoarthritis of knee: Secondary | ICD-10-CM

## 2020-01-06 DIAGNOSIS — G8929 Other chronic pain: Secondary | ICD-10-CM

## 2020-01-06 DIAGNOSIS — G894 Chronic pain syndrome: Secondary | ICD-10-CM

## 2020-01-08 MED ORDER — HYDROCODONE-ACETAMINOPHEN 5-325 MG PO TABS: 2.0000 | ORAL_TABLET | Freq: Four times a day (QID) | ORAL | 0 refills | Status: DC | PRN

## 2020-01-23 ENCOUNTER — Other Ambulatory Visit: Payer: Self-pay | Admitting: Internal Medicine

## 2020-01-23 DIAGNOSIS — I10 Essential (primary) hypertension: Secondary | ICD-10-CM

## 2020-02-11 ENCOUNTER — Other Ambulatory Visit: Payer: Self-pay

## 2020-02-12 ENCOUNTER — Other Ambulatory Visit: Payer: Self-pay | Admitting: Endocrinology

## 2020-02-12 ENCOUNTER — Other Ambulatory Visit: Payer: Self-pay | Admitting: Internal Medicine

## 2020-02-12 ENCOUNTER — Encounter: Payer: Self-pay | Admitting: Internal Medicine

## 2020-02-12 ENCOUNTER — Ambulatory Visit (INDEPENDENT_AMBULATORY_CARE_PROVIDER_SITE_OTHER): Payer: 59 | Admitting: Internal Medicine

## 2020-02-12 VITALS — BP 128/78 | HR 69 | Temp 98.1°F | Resp 16 | Ht 66.0 in | Wt 248.0 lb

## 2020-02-12 DIAGNOSIS — E785 Hyperlipidemia, unspecified: Secondary | ICD-10-CM

## 2020-02-12 DIAGNOSIS — G894 Chronic pain syndrome: Secondary | ICD-10-CM | POA: Diagnosis not present

## 2020-02-12 DIAGNOSIS — Z23 Encounter for immunization: Secondary | ICD-10-CM | POA: Diagnosis not present

## 2020-02-12 DIAGNOSIS — Z79891 Long term (current) use of opiate analgesic: Secondary | ICD-10-CM

## 2020-02-12 DIAGNOSIS — M545 Low back pain, unspecified: Secondary | ICD-10-CM

## 2020-02-12 DIAGNOSIS — E039 Hypothyroidism, unspecified: Secondary | ICD-10-CM

## 2020-02-12 DIAGNOSIS — T502X5A Adverse effect of carbonic-anhydrase inhibitors, benzothiadiazides and other diuretics, initial encounter: Secondary | ICD-10-CM

## 2020-02-12 DIAGNOSIS — I10 Essential (primary) hypertension: Secondary | ICD-10-CM | POA: Diagnosis not present

## 2020-02-12 DIAGNOSIS — G8929 Other chronic pain: Secondary | ICD-10-CM

## 2020-02-12 DIAGNOSIS — E0590 Thyrotoxicosis, unspecified without thyrotoxic crisis or storm: Secondary | ICD-10-CM | POA: Diagnosis not present

## 2020-02-12 DIAGNOSIS — M170 Bilateral primary osteoarthritis of knee: Secondary | ICD-10-CM

## 2020-02-12 DIAGNOSIS — E876 Hypokalemia: Secondary | ICD-10-CM | POA: Insufficient documentation

## 2020-02-12 DIAGNOSIS — M160 Bilateral primary osteoarthritis of hip: Secondary | ICD-10-CM

## 2020-02-12 LAB — LIPID PANEL
Cholesterol: 244 mg/dL — ABNORMAL HIGH (ref 0–200)
HDL: 54.5 mg/dL (ref >39.00)
LDL Cholesterol: 170 mg/dL — ABNORMAL HIGH (ref 0–99)
NonHDL: 189.25
Total CHOL/HDL Ratio: 4
Triglycerides: 95 mg/dL (ref 0.0–149.0)
VLDL: 19 mg/dL (ref 0.0–40.0)

## 2020-02-12 LAB — HEPATIC FUNCTION PANEL
ALT: 20 U/L (ref 0–35)
AST: 20 U/L (ref 0–37)
Albumin: 4.1 g/dL (ref 3.5–5.2)
Alkaline Phosphatase: 56 U/L (ref 39–117)
Bilirubin, Direct: 0.1 mg/dL (ref 0.0–0.3)
Total Bilirubin: 0.5 mg/dL (ref 0.2–1.2)
Total Protein: 7.4 g/dL (ref 6.0–8.3)

## 2020-02-12 LAB — BASIC METABOLIC PANEL
BUN: 18 mg/dL (ref 6–23)
CO2: 27 mEq/L (ref 19–32)
Calcium: 9.2 mg/dL (ref 8.4–10.5)
Chloride: 100 mEq/L (ref 96–112)
Creatinine, Ser: 0.89 mg/dL (ref 0.40–1.20)
GFR: 73.35 mL/min (ref >60.00)
Glucose, Bld: 83 mg/dL (ref 70–99)
Potassium: 3.4 mEq/L — ABNORMAL LOW (ref 3.5–5.1)
Sodium: 136 mEq/L (ref 135–145)

## 2020-02-12 LAB — CBC WITH DIFFERENTIAL/PLATELET
Basophils Absolute: 0.1 10*3/uL (ref 0.0–0.1)
Basophils Relative: 1.1 % (ref 0.0–3.0)
Eosinophils Absolute: 0.1 10*3/uL (ref 0.0–0.7)
Eosinophils Relative: 1.1 % (ref 0.0–5.0)
HCT: 39.3 % (ref 36.0–46.0)
Hemoglobin: 13.4 g/dL (ref 12.0–15.0)
Lymphocytes Relative: 43.2 % (ref 12.0–46.0)
Lymphs Abs: 2.7 10*3/uL (ref 0.7–4.0)
MCHC: 34.2 g/dL (ref 30.0–36.0)
MCV: 100.5 fl — ABNORMAL HIGH (ref 78.0–100.0)
Monocytes Absolute: 0.4 10*3/uL (ref 0.1–1.0)
Monocytes Relative: 6.2 % (ref 3.0–12.0)
Neutro Abs: 3.1 10*3/uL (ref 1.4–7.7)
Neutrophils Relative %: 48.4 % (ref 43.0–77.0)
Platelets: 261 10*3/uL (ref 150.0–400.0)
RBC: 3.91 Mil/uL (ref 3.87–5.11)
RDW: 13.2 % (ref 11.5–15.5)
WBC: 6.3 10*3/uL (ref 4.0–10.5)

## 2020-02-12 LAB — TSH: TSH: 5.74 u[IU]/mL — ABNORMAL HIGH (ref 0.35–4.50)

## 2020-02-12 MED ORDER — LEVOTHYROXINE SODIUM 50 MCG PO TABS: 50.0000 ug | ORAL_TABLET | Freq: Every day | ORAL | 0 refills | Status: DC

## 2020-02-12 MED ORDER — HYDROCODONE-ACETAMINOPHEN 5-325 MG PO TABS: 1.0000 | ORAL_TABLET | Freq: Four times a day (QID) | ORAL | 0 refills | Status: DC | PRN

## 2020-02-12 MED ORDER — INDAPAMIDE 2.5 MG PO TABS
2.5000 mg | ORAL_TABLET | Freq: Every day | ORAL | 1 refills | Status: DC
Start: 2020-02-12 — End: 2020-06-13

## 2020-02-12 MED ORDER — POTASSIUM CHLORIDE CRYS ER 20 MEQ PO TBCR: 20.0000 meq | EXTENDED_RELEASE_TABLET | Freq: Two times a day (BID) | ORAL | 1 refills | Status: DC

## 2020-02-12 MED ORDER — TRAZODONE HCL 100 MG PO TABS: 100.0000 mg | ORAL_TABLET | Freq: Every day | ORAL | 1 refills | Status: DC

## 2020-02-12 MED ORDER — MELOXICAM 15 MG PO TABS: 15.0000 mg | ORAL_TABLET | Freq: Every day | ORAL | 1 refills | Status: DC

## 2020-02-12 NOTE — Progress Notes (Signed)
Subjective:  Patient ID: Denise Morgan, female    DOB: 07/30/1965  Age: 54 y.o. MRN: [id number]  CC: Hypertension, Hyperlipidemia, Back Pain, and Osteoarthritis  This visit occurred during the SARS-CoV-2 public health emergency.  Safety protocols were in place, including screening questions prior to the visit, additional usage of staff PPE, and extensive cleaning of exam room while observing appropriate contact time as indicated for disinfecting solutions.    HPI Denise Morgan presents for f/up - She continues to complain of musculoskeletal pain and requests a refill of hydrocodone/acetaminophen.  She has had no recent changes in her joint or back pain.  She has a history of hyperthyroidism.  She has intentionally lost weight recently with lifestyle modifications.  She tells me her blood pressure has been well controlled.  She denies any recent episodes of headache, blurred vision, chest pain, shortness of breath, dyspnea on exertion, edema, or fatigue.  Outpatient Medications Prior to Visit  Medication Sig Dispense Refill  . albuterol (PROVENTIL HFA;VENTOLIN HFA) 108 (90 Base) MCG/ACT inhaler Inhale 2 puffs into the lungs every 4 (four) hours as needed for wheezing. 1 Inhaler 11  . amLODipine (NORVASC) 5 MG tablet Take 1 tablet (5 mg total) by mouth daily. 90 tablet 1  . HYDROcodone-acetaminophen (NORCO/VICODIN) 5-325 MG tablet Take 2 tablets by mouth every 6 (six) hours as needed for moderate pain. 90 tablet 0  . indapamide (LOZOL) 2.5 MG tablet TAKE 1 TABLET BY MOUTH EVERY DAY 90 tablet 0  . meloxicam (MOBIC) 15 MG tablet TAKE 1 TABLET BY MOUTH EVERY DAY 90 tablet 1  . traZODone (DESYREL) 100 MG tablet TAKE 1 TABLET BY MOUTH EVERYDAY AT BEDTIME 90 tablet 1   No facility-administered medications prior to visit.    ROS Review of Systems  Constitutional: Negative for appetite change, chills, diaphoresis, fatigue and fever.  HENT: Negative.   Eyes: Negative for visual disturbance.   Respiratory: Negative for cough, chest tightness, shortness of breath and wheezing.   Cardiovascular: Negative for chest pain, palpitations and leg swelling.  Gastrointestinal: Negative for abdominal pain, constipation, diarrhea, nausea and vomiting.  Endocrine: Negative for cold intolerance and heat intolerance.  Genitourinary: Negative.  Negative for difficulty urinating, dysuria and hematuria.  Musculoskeletal: Positive for arthralgias and back pain. Negative for myalgias and neck pain.  Skin: Negative.   Neurological: Negative.  Negative for dizziness, weakness and light-headedness.  Hematological: Negative for adenopathy. Does not bruise/bleed easily.  Psychiatric/Behavioral: Negative.  Negative for behavioral problems, decreased concentration and sleep disturbance. The patient is not nervous/anxious.     Objective:  BP 128/78   Pulse 69   Temp 98.1 F (36.7 C) (Oral)   Resp 16   Ht 5\' 6"  (1.676 m)   Wt 248 lb (112.5 kg)   LMP 03/01/2004   SpO2 98%   BMI 40.03 kg/m   BP Readings from Last 3 Encounters:  02/12/20 128/78  05/22/19 138/80  03/04/19 (!) 133/94    Wt Readings from Last 3 Encounters:  02/12/20 248 lb (112.5 kg)  05/22/19 264 lb (119.7 kg)  02/21/19 270 lb (122.5 kg)    Physical Exam Vitals reviewed.  Constitutional:      Appearance: Normal appearance.  HENT:     Nose: Nose normal.     Mouth/Throat:     Mouth: Mucous membranes are moist.  Eyes:     General: No scleral icterus.    Conjunctiva/sclera: Conjunctivae normal.  Cardiovascular:     Rate and Rhythm: Normal  rate and regular rhythm.     Pulses: Normal pulses.     Heart sounds: No murmur heard.   Pulmonary:     Effort: Pulmonary effort is normal.     Breath sounds: No stridor. No wheezing, rhonchi or rales.  Abdominal:     General: Abdomen is protuberant. Bowel sounds are normal. There is no distension.     Palpations: Abdomen is soft. There is no hepatomegaly, splenomegaly or mass.      Tenderness: There is no abdominal tenderness.  Musculoskeletal:        General: Normal range of motion.     Cervical back: Neck supple.     Right lower leg: No edema.     Left lower leg: No edema.  Lymphadenopathy:     Cervical: No cervical adenopathy.  Skin:    General: Skin is warm and dry.     Coloration: Skin is not pale.  Neurological:     General: No focal deficit present.     Mental Status: She is alert.  Psychiatric:        Mood and Affect: Mood normal.        Behavior: Behavior normal.     Lab Results  Component Value Date   WBC 6.3 02/12/2020   HGB 13.4 02/12/2020   HCT 39.3 02/12/2020   PLT 261.0 02/12/2020   GLUCOSE 83 02/12/2020   CHOL 244 (H) 02/12/2020   TRIG 95.0 02/12/2020   HDL 54.50 02/12/2020   LDLDIRECT 155.6 02/16/2013   LDLCALC 170 (H) 02/12/2020   ALT 20 02/12/2020   AST 20 02/12/2020   NA 136 02/12/2020   K 3.4 (L) 02/12/2020   CL 100 02/12/2020   CREATININE 0.89 02/12/2020   BUN 18 02/12/2020   CO2 27 02/12/2020   TSH 5.74 (H) 02/12/2020   INR 3.2 11/24/2012    No results found.  Assessment & Plan:   Denise Morgan was seen today for hypertension, hyperlipidemia, back pain and osteoarthritis.  Diagnoses and all orders for this visit:  Hyperthyroidism- She has developed hypothyroidism. -     TSH; Future -     TSH  Primary osteoarthritis of both knees -     HYDROcodone-acetaminophen (NORCO/VICODIN) 5-325 MG tablet; Take 1-2 tablets by mouth every 6 (six) hours as needed for moderate pain. -     meloxicam (MOBIC) 15 MG tablet; Take 1 tablet (15 mg total) by mouth daily.  Chronic bilateral low back pain without sciatica -     HYDROcodone-acetaminophen (NORCO/VICODIN) 5-325 MG tablet; Take 1-2 tablets by mouth every 6 (six) hours as needed for moderate pain. -     meloxicam (MOBIC) 15 MG tablet; Take 1 tablet (15 mg total) by mouth daily.  Chronic pain syndrome -     HYDROcodone-acetaminophen (NORCO/VICODIN) 5-325 MG tablet; Take 1-2  tablets by mouth every 6 (six) hours as needed for moderate pain. -     meloxicam (MOBIC) 15 MG tablet; Take 1 tablet (15 mg total) by mouth daily. -     traZODone (DESYREL) 100 MG tablet; Take 1 tablet (100 mg total) by mouth at bedtime.  Primary osteoarthritis of both hips -     HYDROcodone-acetaminophen (NORCO/VICODIN) 5-325 MG tablet; Take 1-2 tablets by mouth every 6 (six) hours as needed for moderate pain. -     meloxicam (MOBIC) 15 MG tablet; Take 1 tablet (15 mg total) by mouth daily.  Essential hypertension- Her blood pressure is well controlled but she has developed hypokalemia from the  thiazide diuretic.  I recommended that she start a potassium supplement. -     CBC with Differential/Platelet; Future -     Basic metabolic panel; Future -     CBC with Differential/Platelet -     Basic metabolic panel -     indapamide (LOZOL) 2.5 MG tablet; Take 1 tablet (2.5 mg total) by mouth daily. -     potassium chloride SA (KLOR-CON) 20 MEQ tablet; Take 1 tablet (20 mEq total) by mouth 2 (two) times daily.  Hyperlipidemia with target LDL less than 130- Her ASCVD risk or is less than 15% so I did not recommend a statin for CV risk reduction. -     Lipid panel; Future -     Hepatic function panel; Future -     Lipid panel -     Hepatic function panel  Acquired hypothyroidism -     Discontinue: levothyroxine (SYNTHROID) 50 MCG tablet; Take 1 tablet (50 mcg total) by mouth daily before breakfast. -     levothyroxine (SYNTHROID) 50 MCG tablet; Take 1 tablet (50 mcg total) by mouth daily before breakfast.  Diuretic-induced hypokalemia -     potassium chloride SA (KLOR-CON) 20 MEQ tablet; Take 1 tablet (20 mEq total) by mouth 2 (two) times daily.  Encounter for long-term use of opiate analgesic -     naloxone (NARCAN) nasal spray 4 mg/0.1 mL; Place 1 spray into the nose once for 1 dose.  Other orders -     Flu Vaccine QUAD 6+ mos PF IM (Fluarix Quad PF)   I have discontinued Yvette R.  Gwilliam's amLODipine. I have also changed her HYDROcodone-acetaminophen, meloxicam, indapamide, and traZODone. Additionally, I am having her start on potassium chloride SA and naloxone. Lastly, I am having her maintain her albuterol and levothyroxine.  Meds ordered this encounter  Medications  . HYDROcodone-acetaminophen (NORCO/VICODIN) 5-325 MG tablet    Sig: Take 1-2 tablets by mouth every 6 (six) hours as needed for moderate pain.    Dispense:  100 tablet    Refill:  0  . meloxicam (MOBIC) 15 MG tablet    Sig: Take 1 tablet (15 mg total) by mouth daily.    Dispense:  90 tablet    Refill:  1  . indapamide (LOZOL) 2.5 MG tablet    Sig: Take 1 tablet (2.5 mg total) by mouth daily.    Dispense:  90 tablet    Refill:  1  . traZODone (DESYREL) 100 MG tablet    Sig: Take 1 tablet (100 mg total) by mouth at bedtime.    Dispense:  90 tablet    Refill:  1  . DISCONTD: levothyroxine (SYNTHROID) 50 MCG tablet    Sig: Take 1 tablet (50 mcg total) by mouth daily before breakfast.    Dispense:  90 tablet    Refill:  0  . levothyroxine (SYNTHROID) 50 MCG tablet    Sig: Take 1 tablet (50 mcg total) by mouth daily before breakfast.    Dispense:  90 tablet    Refill:  0  . potassium chloride SA (KLOR-CON) 20 MEQ tablet    Sig: Take 1 tablet (20 mEq total) by mouth 2 (two) times daily.    Dispense:  180 tablet    Refill:  1  . naloxone (NARCAN) nasal spray 4 mg/0.1 mL    Sig: Place 1 spray into the nose once for 1 dose.    Dispense:  2 each    Refill:  3  I spent 50 minutes in preparing to see the patient by review of recent labs, imaging and procedures, obtaining and reviewing separately obtained history, communicating with the patient and family or caregiver, ordering medications, tests or procedures, and documenting clinical information in the EHR including the differential Dx, treatment, and any further evaluation and other management of 1. Primary osteoarthritis of both knees 2. Chronic  bilateral low back pain without sciatica 3. Chronic pain syndrome 4. Primary osteoarthritis of both hips 5. Hyperthyroidism 6. Essential hypertension 7. Hyperlipidemia with target LDL less than 130 8. Acquired hypothyroidism 9. Diuretic-induced hypokalemia 10. Encounter for long-term use of opiate analgesic      Follow-up: Return in about 6 months (around 08/12/2020).  Scarlette Calico, MD

## 2020-02-12 NOTE — Patient Instructions (Signed)

## 2020-02-16 DIAGNOSIS — Z79891 Long term (current) use of opiate analgesic: Secondary | ICD-10-CM | POA: Insufficient documentation

## 2020-02-16 MED ORDER — NALOXONE HCL 4 MG/0.1ML NA LIQD: 1.0000 | Freq: Once | NASAL | 3 refills | Status: AC

## 2020-03-14 ENCOUNTER — Other Ambulatory Visit: Payer: Self-pay | Admitting: Internal Medicine

## 2020-03-14 DIAGNOSIS — G894 Chronic pain syndrome: Secondary | ICD-10-CM

## 2020-03-14 DIAGNOSIS — M160 Bilateral primary osteoarthritis of hip: Secondary | ICD-10-CM

## 2020-03-14 DIAGNOSIS — M545 Low back pain, unspecified: Secondary | ICD-10-CM

## 2020-03-14 DIAGNOSIS — M170 Bilateral primary osteoarthritis of knee: Secondary | ICD-10-CM

## 2020-03-14 DIAGNOSIS — G8929 Other chronic pain: Secondary | ICD-10-CM

## 2020-03-15 ENCOUNTER — Other Ambulatory Visit: Payer: Self-pay | Admitting: Internal Medicine

## 2020-03-15 DIAGNOSIS — M170 Bilateral primary osteoarthritis of knee: Secondary | ICD-10-CM

## 2020-03-15 DIAGNOSIS — M160 Bilateral primary osteoarthritis of hip: Secondary | ICD-10-CM

## 2020-03-15 DIAGNOSIS — G894 Chronic pain syndrome: Secondary | ICD-10-CM

## 2020-03-15 DIAGNOSIS — G8929 Other chronic pain: Secondary | ICD-10-CM

## 2020-03-15 MED ORDER — HYDROCODONE-ACETAMINOPHEN 5-325 MG PO TABS: 1.0000 | ORAL_TABLET | Freq: Four times a day (QID) | ORAL | 0 refills | Status: DC | PRN

## 2020-03-24 ENCOUNTER — Other Ambulatory Visit: Payer: Self-pay | Admitting: Internal Medicine

## 2020-03-24 DIAGNOSIS — Z1231 Encounter for screening mammogram for malignant neoplasm of breast: Secondary | ICD-10-CM

## 2020-04-02 ENCOUNTER — Other Ambulatory Visit: Payer: Self-pay | Admitting: Internal Medicine

## 2020-04-02 DIAGNOSIS — I10 Essential (primary) hypertension: Secondary | ICD-10-CM

## 2020-04-14 ENCOUNTER — Other Ambulatory Visit: Payer: Self-pay | Admitting: Internal Medicine

## 2020-04-14 DIAGNOSIS — G894 Chronic pain syndrome: Secondary | ICD-10-CM

## 2020-04-14 DIAGNOSIS — G8929 Other chronic pain: Secondary | ICD-10-CM

## 2020-04-14 DIAGNOSIS — M545 Low back pain, unspecified: Secondary | ICD-10-CM

## 2020-04-14 DIAGNOSIS — M160 Bilateral primary osteoarthritis of hip: Secondary | ICD-10-CM

## 2020-04-14 DIAGNOSIS — M170 Bilateral primary osteoarthritis of knee: Secondary | ICD-10-CM

## 2020-04-17 ENCOUNTER — Other Ambulatory Visit: Payer: Self-pay | Admitting: Internal Medicine

## 2020-04-17 DIAGNOSIS — G894 Chronic pain syndrome: Secondary | ICD-10-CM

## 2020-04-17 DIAGNOSIS — M545 Low back pain, unspecified: Secondary | ICD-10-CM

## 2020-04-17 DIAGNOSIS — M160 Bilateral primary osteoarthritis of hip: Secondary | ICD-10-CM

## 2020-04-17 DIAGNOSIS — M170 Bilateral primary osteoarthritis of knee: Secondary | ICD-10-CM

## 2020-04-17 DIAGNOSIS — G8929 Other chronic pain: Secondary | ICD-10-CM

## 2020-04-17 MED ORDER — HYDROCODONE-ACETAMINOPHEN 5-325 MG PO TABS: 1.0000 | ORAL_TABLET | Freq: Four times a day (QID) | ORAL | 0 refills | Status: DC | PRN

## 2020-04-17 NOTE — Telephone Encounter (Signed)
Check [place] registry last filled 03/15/2020. MD is out of the office this week. Pls advise.Marland KitchenJohny Chess

## 2020-04-17 NOTE — Telephone Encounter (Signed)
Done erx  [email or web address] revewed  mme 60

## 2020-04-17 NOTE — Telephone Encounter (Signed)
Patient calling to check the status of the medication

## 2020-05-09 ENCOUNTER — Ambulatory Visit: Payer: 59

## 2020-05-10 ENCOUNTER — Other Ambulatory Visit: Payer: Self-pay | Admitting: Internal Medicine

## 2020-05-10 DIAGNOSIS — G894 Chronic pain syndrome: Secondary | ICD-10-CM

## 2020-05-10 DIAGNOSIS — G8929 Other chronic pain: Secondary | ICD-10-CM

## 2020-05-10 DIAGNOSIS — M170 Bilateral primary osteoarthritis of knee: Secondary | ICD-10-CM

## 2020-05-10 DIAGNOSIS — M545 Low back pain, unspecified: Secondary | ICD-10-CM

## 2020-05-10 DIAGNOSIS — M160 Bilateral primary osteoarthritis of hip: Secondary | ICD-10-CM

## 2020-05-13 ENCOUNTER — Other Ambulatory Visit: Payer: Self-pay | Admitting: Internal Medicine

## 2020-05-13 DIAGNOSIS — M170 Bilateral primary osteoarthritis of knee: Secondary | ICD-10-CM

## 2020-05-13 DIAGNOSIS — G894 Chronic pain syndrome: Secondary | ICD-10-CM

## 2020-05-13 DIAGNOSIS — M160 Bilateral primary osteoarthritis of hip: Secondary | ICD-10-CM

## 2020-05-13 DIAGNOSIS — M545 Low back pain, unspecified: Secondary | ICD-10-CM

## 2020-05-13 DIAGNOSIS — G8929 Other chronic pain: Secondary | ICD-10-CM

## 2020-05-13 MED ORDER — HYDROCODONE-ACETAMINOPHEN 5-325 MG PO TABS: 1.0000 | ORAL_TABLET | Freq: Four times a day (QID) | ORAL | 0 refills | Status: DC | PRN

## 2020-06-11 ENCOUNTER — Other Ambulatory Visit: Payer: Self-pay | Admitting: Internal Medicine

## 2020-06-11 DIAGNOSIS — G8929 Other chronic pain: Secondary | ICD-10-CM

## 2020-06-11 DIAGNOSIS — M170 Bilateral primary osteoarthritis of knee: Secondary | ICD-10-CM

## 2020-06-11 DIAGNOSIS — M545 Low back pain, unspecified: Secondary | ICD-10-CM

## 2020-06-11 DIAGNOSIS — G894 Chronic pain syndrome: Secondary | ICD-10-CM

## 2020-06-11 DIAGNOSIS — M160 Bilateral primary osteoarthritis of hip: Secondary | ICD-10-CM

## 2020-06-12 ENCOUNTER — Other Ambulatory Visit: Payer: Self-pay | Admitting: Internal Medicine

## 2020-06-12 ENCOUNTER — Other Ambulatory Visit: Payer: Self-pay | Admitting: Endocrinology

## 2020-06-12 DIAGNOSIS — G8929 Other chronic pain: Secondary | ICD-10-CM

## 2020-06-12 DIAGNOSIS — I10 Essential (primary) hypertension: Secondary | ICD-10-CM

## 2020-06-12 DIAGNOSIS — M160 Bilateral primary osteoarthritis of hip: Secondary | ICD-10-CM

## 2020-06-12 DIAGNOSIS — M170 Bilateral primary osteoarthritis of knee: Secondary | ICD-10-CM

## 2020-06-12 DIAGNOSIS — G894 Chronic pain syndrome: Secondary | ICD-10-CM

## 2020-06-12 DIAGNOSIS — E876 Hypokalemia: Secondary | ICD-10-CM

## 2020-06-12 DIAGNOSIS — T502X5A Adverse effect of carbonic-anhydrase inhibitors, benzothiadiazides and other diuretics, initial encounter: Secondary | ICD-10-CM

## 2020-06-12 MED ORDER — HYDROCODONE-ACETAMINOPHEN 5-325 MG PO TABS: 1.0000 | ORAL_TABLET | Freq: Four times a day (QID) | ORAL | 0 refills | Status: DC | PRN

## 2020-07-04 ENCOUNTER — Other Ambulatory Visit: Payer: Self-pay | Admitting: Internal Medicine

## 2020-07-04 ENCOUNTER — Other Ambulatory Visit: Payer: Self-pay

## 2020-07-04 ENCOUNTER — Ambulatory Visit
Admission: RE | Admit: 2020-07-04 | Discharge: 2020-07-04 | Disposition: A | Discharge status: Home / Self Care | Payer: 59 | Source: Ambulatory Visit | Attending: Internal Medicine | Admitting: Internal Medicine

## 2020-07-04 DIAGNOSIS — Z1231 Encounter for screening mammogram for malignant neoplasm of breast: Secondary | ICD-10-CM

## 2020-07-10 ENCOUNTER — Other Ambulatory Visit: Payer: Self-pay | Admitting: Internal Medicine

## 2020-07-10 DIAGNOSIS — G894 Chronic pain syndrome: Secondary | ICD-10-CM

## 2020-07-10 DIAGNOSIS — M170 Bilateral primary osteoarthritis of knee: Secondary | ICD-10-CM

## 2020-07-10 DIAGNOSIS — G8929 Other chronic pain: Secondary | ICD-10-CM

## 2020-07-10 DIAGNOSIS — M545 Low back pain, unspecified: Secondary | ICD-10-CM

## 2020-07-10 DIAGNOSIS — M160 Bilateral primary osteoarthritis of hip: Secondary | ICD-10-CM

## 2020-07-11 MED ORDER — HYDROCODONE-ACETAMINOPHEN 5-325 MG PO TABS: 1.0000 | ORAL_TABLET | Freq: Four times a day (QID) | ORAL | 0 refills | Status: DC | PRN

## 2020-07-30 HISTORY — PX: COLONOSCOPY W/ POLYPECTOMY: SHX1380

## 2020-08-07 ENCOUNTER — Other Ambulatory Visit: Payer: Self-pay | Admitting: Internal Medicine

## 2020-08-07 DIAGNOSIS — M160 Bilateral primary osteoarthritis of hip: Secondary | ICD-10-CM

## 2020-08-07 DIAGNOSIS — G8929 Other chronic pain: Secondary | ICD-10-CM

## 2020-08-07 DIAGNOSIS — M170 Bilateral primary osteoarthritis of knee: Secondary | ICD-10-CM

## 2020-08-07 DIAGNOSIS — G894 Chronic pain syndrome: Secondary | ICD-10-CM

## 2020-08-07 DIAGNOSIS — M545 Low back pain, unspecified: Secondary | ICD-10-CM

## 2020-08-11 ENCOUNTER — Telehealth: Payer: Self-pay | Admitting: Internal Medicine

## 2020-08-11 NOTE — Telephone Encounter (Signed)
1.Medication Requested: HYDROcodone-acetaminophen (NORCO/VICODIN) 5-325 MG tablet   2. Pharmacy (Name, Street, [place]): [id number] - [place], [place]  3. On Med List: yes   4. Last Visit with PCP: 02-12-20  5. Next visit date with PCP: 09-17-20   Agent: Please be advised that RX refills may take up to 3 business days. We ask that you follow-up with your pharmacy.

## 2020-08-13 ENCOUNTER — Other Ambulatory Visit: Payer: Self-pay | Admitting: Internal Medicine

## 2020-08-13 DIAGNOSIS — G894 Chronic pain syndrome: Secondary | ICD-10-CM

## 2020-08-13 DIAGNOSIS — M160 Bilateral primary osteoarthritis of hip: Secondary | ICD-10-CM

## 2020-08-13 DIAGNOSIS — M170 Bilateral primary osteoarthritis of knee: Secondary | ICD-10-CM

## 2020-08-13 DIAGNOSIS — G8929 Other chronic pain: Secondary | ICD-10-CM

## 2020-08-13 MED ORDER — HYDROCODONE-ACETAMINOPHEN 5-325 MG PO TABS: 1.0000 | ORAL_TABLET | Freq: Four times a day (QID) | ORAL | 0 refills | Status: DC | PRN

## 2020-08-21 ENCOUNTER — Other Ambulatory Visit: Payer: Self-pay | Admitting: Internal Medicine

## 2020-08-21 DIAGNOSIS — E039 Hypothyroidism, unspecified: Secondary | ICD-10-CM

## 2020-08-21 MED ORDER — LEVOTHYROXINE SODIUM 50 MCG PO TABS: 50.0000 ug | ORAL_TABLET | Freq: Every day | ORAL | 0 refills | Status: DC

## 2020-09-15 ENCOUNTER — Other Ambulatory Visit: Payer: Self-pay | Admitting: Internal Medicine

## 2020-09-15 DIAGNOSIS — E039 Hypothyroidism, unspecified: Secondary | ICD-10-CM

## 2020-09-17 ENCOUNTER — Ambulatory Visit (INDEPENDENT_AMBULATORY_CARE_PROVIDER_SITE_OTHER): Payer: 59 | Admitting: Internal Medicine

## 2020-09-17 ENCOUNTER — Other Ambulatory Visit: Payer: Self-pay

## 2020-09-17 ENCOUNTER — Encounter: Payer: Self-pay | Admitting: Internal Medicine

## 2020-09-17 VITALS — BP 136/84 | HR 64 | Temp 98.5°F | Resp 16 | Ht 66.0 in | Wt 245.0 lb

## 2020-09-17 DIAGNOSIS — G894 Chronic pain syndrome: Secondary | ICD-10-CM

## 2020-09-17 DIAGNOSIS — E039 Hypothyroidism, unspecified: Secondary | ICD-10-CM

## 2020-09-17 DIAGNOSIS — I10 Essential (primary) hypertension: Secondary | ICD-10-CM

## 2020-09-17 DIAGNOSIS — T502X5A Adverse effect of carbonic-anhydrase inhibitors, benzothiadiazides and other diuretics, initial encounter: Secondary | ICD-10-CM

## 2020-09-17 DIAGNOSIS — E876 Hypokalemia: Secondary | ICD-10-CM | POA: Diagnosis not present

## 2020-09-17 DIAGNOSIS — G8929 Other chronic pain: Secondary | ICD-10-CM | POA: Diagnosis not present

## 2020-09-17 DIAGNOSIS — M160 Bilateral primary osteoarthritis of hip: Secondary | ICD-10-CM

## 2020-09-17 DIAGNOSIS — Z79891 Long term (current) use of opiate analgesic: Secondary | ICD-10-CM

## 2020-09-17 DIAGNOSIS — M170 Bilateral primary osteoarthritis of knee: Secondary | ICD-10-CM

## 2020-09-17 DIAGNOSIS — Z0001 Encounter for general adult medical examination with abnormal findings: Secondary | ICD-10-CM

## 2020-09-17 DIAGNOSIS — M545 Low back pain, unspecified: Secondary | ICD-10-CM

## 2020-09-17 DIAGNOSIS — Z0000 Encounter for general adult medical examination without abnormal findings: Secondary | ICD-10-CM

## 2020-09-17 DIAGNOSIS — Z720 Tobacco use: Secondary | ICD-10-CM

## 2020-09-17 LAB — BASIC METABOLIC PANEL
BUN: 22 mg/dL (ref 6–23)
CO2: 26 mEq/L (ref 19–32)
Calcium: 9.3 mg/dL (ref 8.4–10.5)
Chloride: 102 mEq/L (ref 96–112)
Creatinine, Ser: 0.86 mg/dL (ref 0.40–1.20)
GFR: 76.11 mL/min (ref >60.00)
Glucose, Bld: 92 mg/dL (ref 70–99)
Potassium: 3.8 mEq/L (ref 3.5–5.1)
Sodium: 136 mEq/L (ref 135–145)

## 2020-09-17 MED ORDER — HYDROCODONE-ACETAMINOPHEN 5-325 MG PO TABS: 1.0000 | ORAL_TABLET | Freq: Four times a day (QID) | ORAL | 0 refills | Status: DC | PRN

## 2020-09-17 NOTE — Patient Instructions (Signed)
Health Maintenance, Female Adopting a healthy lifestyle and getting preventive care are important in promoting health and wellness. Ask your health care provider about: The right schedule for you to have regular tests and exams. Things you can do on your own to prevent diseases and keep yourself healthy. What should I know about diet, weight, and exercise? Eat a healthy diet  Eat a diet that includes plenty of vegetables, fruits, low-fat dairy products, and lean protein. Do not eat a lot of foods that are high in solid fats, added sugars, or sodium.  Maintain a healthy weight Body mass index (BMI) is used to identify weight problems. It estimates body fat based on height and weight. Your health care provider can help determineyour BMI and help you achieve or maintain a healthy weight. Get regular exercise Get regular exercise. This is one of the most important things you can do for your health. Most adults should: Exercise for at least 150 minutes each week. The exercise should increase your heart rate and make you sweat (moderate-intensity exercise). Do strengthening exercises at least twice a week. This is in addition to the moderate-intensity exercise. Spend less time sitting. Even light physical activity can be beneficial. Watch cholesterol and blood lipids Have your blood tested for lipids and cholesterol at 55 years of age, then havethis test every 5 years. Have your cholesterol levels checked more often if: Your lipid or cholesterol levels are high. You are older than 55 years of age. You are at high risk for heart disease. What should I know about cancer screening? Depending on your health history and family history, you may need to have cancer screening at various ages. This may include screening for: Breast cancer. Cervical cancer. Colorectal cancer. Skin cancer. Lung cancer. What should I know about heart disease, diabetes, and high blood pressure? Blood pressure and heart  disease High blood pressure causes heart disease and increases the risk of stroke. This is more likely to develop in people who have high blood pressure readings, are of African descent, or are overweight. Have your blood pressure checked: Every 3-5 years if you are 18-39 years of age. Every year if you are 40 years old or older. Diabetes Have regular diabetes screenings. This checks your fasting blood sugar level. Have the screening done: Once every three years after age 40 if you are at a normal weight and have a low risk for diabetes. More often and at a younger age if you are overweight or have a high risk for diabetes. What should I know about preventing infection? Hepatitis B If you have a higher risk for hepatitis B, you should be screened for this virus. Talk with your health care provider to find out if you are at risk forhepatitis B infection. Hepatitis C Testing is recommended for: Everyone born from 1945 through 1965. Anyone with known risk factors for hepatitis C. Sexually transmitted infections (STIs) Get screened for STIs, including gonorrhea and chlamydia, if: You are sexually active and are younger than 55 years of age. You are older than 55 years of age and your health care provider tells you that you are at risk for this type of infection. Your sexual activity has changed since you were last screened, and you are at increased risk for chlamydia or gonorrhea. Ask your health care provider if you are at risk. Ask your health care provider about whether you are at high risk for HIV. Your health care provider may recommend a prescription medicine to help   prevent HIV infection. If you choose to take medicine to prevent HIV, you should first get tested for HIV. You should then be tested every 3 months for as long as you are taking the medicine. Pregnancy If you are about to stop having your period (premenopausal) and you may become pregnant, seek counseling before you get  pregnant. Take 400 to 800 micrograms (mcg) of folic acid every day if you become pregnant. Ask for birth control (contraception) if you want to prevent pregnancy. Osteoporosis and menopause Osteoporosis is a disease in which the bones lose minerals and strength with aging. This can result in bone fractures. If you are 65 years old or older, or if you are at risk for osteoporosis and fractures, ask your health care provider if you should: Be screened for bone loss. Take a calcium or vitamin D supplement to lower your risk of fractures. Be given hormone replacement therapy (HRT) to treat symptoms of menopause. Follow these instructions at home: Lifestyle Do not use any products that contain nicotine or tobacco, such as cigarettes, e-cigarettes, and chewing tobacco. If you need help quitting, ask your health care provider. Do not use street drugs. Do not share needles. Ask your health care provider for help if you need support or information about quitting drugs. Alcohol use Do not drink alcohol if: Your health care provider tells you not to drink. You are pregnant, may be pregnant, or are planning to become pregnant. If you drink alcohol: Limit how much you use to 0-1 drink a day. Limit intake if you are breastfeeding. Be aware of how much alcohol is in your drink. In the [place] one drink equals one 12 oz bottle of beer (355 mL), one 5 oz glass of wine (148 mL), or one 1 oz glass of hard liquor (44 mL). General instructions Schedule regular health, dental, and eye exams. Stay current with your vaccines. Tell your health care provider if: You often feel depressed. You have ever been abused or do not feel safe at home. Summary Adopting a healthy lifestyle and getting preventive care are important in promoting health and wellness. Follow your health care provider's instructions about healthy diet, exercising, and getting tested or screened for diseases. Follow your health care provider's  instructions on monitoring your cholesterol and blood pressure. This information is not intended to replace advice given to you by your health care provider. Make sure you discuss any questions you have with your healthcare provider. Document Revised: 02/08/2018 Document Reviewed: 02/08/2018 Elsevier Patient Education  2022 [employer]  

## 2020-09-17 NOTE — Progress Notes (Signed)
Subjective:  Patient ID: Denise Morgan, female    DOB: 02-28-1966  Age: 56 y.o. MRN: [id number]  CC: Annual Exam, Hypertension, Osteoarthritis, Back Pain, and Hypothyroidism  This visit occurred during the SARS-CoV-2 public health emergency.  Safety protocols were in place, including screening questions prior to the visit, additional usage of staff PPE, and extensive cleaning of exam room while observing appropriate contact time as indicated for disinfecting solutions.    HPI Denise Morgan presents for f/up.  She complains that her potassium tablet is too large and she is having trouble swallowing.  She wants to try a smaller tablet.  She continues to complain of musculoskeletal pain and requests a refill of hydrocodone.  She is active and denies any recent episodes of chest pain, shortness of breath, diaphoresis, dizziness, lightheadedness, edema, or fatigue.  Outpatient Medications Prior to Visit  Medication Sig Dispense Refill   albuterol (PROVENTIL HFA;VENTOLIN HFA) 108 (90 Base) MCG/ACT inhaler Inhale 2 puffs into the lungs every 4 (four) hours as needed for wheezing. 1 Inhaler 11   indapamide (LOZOL) 2.5 MG tablet TAKE 1 TABLET BY MOUTH EVERY DAY 90 tablet 0   levothyroxine (SYNTHROID) 50 MCG tablet TAKE 1 TABLET BY MOUTH DAILY BEFORE BREAKFAST 30 tablet 0   meloxicam (MOBIC) 15 MG tablet TAKE 1 TABLET (15 MG TOTAL) BY MOUTH DAILY. 90 tablet 0   traZODone (DESYREL) 100 MG tablet TAKE 1 TABLET BY MOUTH EVERYDAY AT BEDTIME 90 tablet 0   HYDROcodone-acetaminophen (NORCO/VICODIN) 5-325 MG tablet Take 1-2 tablets by mouth every 6 (six) hours as needed for moderate pain. 100 tablet 0   KLOR-CON M20 20 MEQ tablet TAKE 1 TABLET BY MOUTH TWICE A DAY 180 tablet 0   No facility-administered medications prior to visit.    ROS Review of Systems  Constitutional:  Negative for appetite change, diaphoresis, fatigue and unexpected weight change.  HENT: Negative.    Eyes:  Negative for  visual disturbance.  Respiratory:  Negative for cough, chest tightness, shortness of breath and wheezing.   Cardiovascular:  Negative for chest pain, palpitations and leg swelling.  Gastrointestinal:  Negative for abdominal pain, constipation, diarrhea, nausea and vomiting.  Endocrine: Negative.  Negative for cold intolerance and heat intolerance.  Genitourinary: Negative.  Negative for difficulty urinating and frequency.  Musculoskeletal:  Positive for arthralgias and back pain. Negative for myalgias.  Skin: Negative.   Neurological: Negative.  Negative for dizziness, weakness, light-headedness and headaches.  Hematological:  Negative for adenopathy. Does not bruise/bleed easily.  Psychiatric/Behavioral: Negative.     Objective:  BP 136/84 (BP Location: Right Arm, Patient Position: Sitting, Cuff Size: Large)   Pulse 64   Temp 98.5 F (36.9 C) (Oral)   Resp 16   Ht 5\' 6"  (1.676 m)   Wt 245 lb (111.1 kg)   LMP 03/01/2004   SpO2 97%   BMI 39.54 kg/m   BP Readings from Last 3 Encounters:  09/17/20 136/84  02/12/20 128/78  05/22/19 138/80    Wt Readings from Last 3 Encounters:  09/17/20 245 lb (111.1 kg)  02/12/20 248 lb (112.5 kg)  05/22/19 264 lb (119.7 kg)    Physical Exam Vitals reviewed.  Constitutional:      Appearance: Normal appearance.  HENT:     Mouth/Throat:     Mouth: Mucous membranes are moist.  Eyes:     Conjunctiva/sclera: Conjunctivae normal.  Cardiovascular:     Rate and Rhythm: Normal rate and regular rhythm.  Heart sounds: No murmur heard. Pulmonary:     Effort: Pulmonary effort is normal.     Breath sounds: No stridor. No wheezing, rhonchi or rales.  Abdominal:     General: Abdomen is protuberant. Bowel sounds are normal. There is no distension.     Palpations: Abdomen is soft. There is no fluid wave, hepatomegaly, splenomegaly or mass.     Tenderness: There is no abdominal tenderness. There is no guarding.  Musculoskeletal:        General:  Deformity (DJD) present. Normal range of motion.     Cervical back: Neck supple.     Right lower leg: No edema.     Left lower leg: No edema.  Lymphadenopathy:     Cervical: No cervical adenopathy.  Skin:    General: Skin is warm.  Neurological:     General: No focal deficit present.     Mental Status: She is alert.  Psychiatric:        Mood and Affect: Mood normal.        Behavior: Behavior normal.    Lab Results  Component Value Date   WBC 6.3 02/12/2020   HGB 13.4 02/12/2020   HCT 39.3 02/12/2020   PLT 261.0 02/12/2020   GLUCOSE 92 09/17/2020   CHOL 244 (H) 02/12/2020   TRIG 95.0 02/12/2020   HDL 54.50 02/12/2020   LDLDIRECT 155.6 02/16/2013   LDLCALC 170 (H) 02/12/2020   ALT 20 02/12/2020   AST 20 02/12/2020   NA 136 09/17/2020   K 3.8 09/17/2020   CL 102 09/17/2020   CREATININE 0.86 09/17/2020   BUN 22 09/17/2020   CO2 26 09/17/2020   TSH 1.01 09/17/2020   INR 3.2 11/24/2012    MM 3D SCREEN BREAST BILATERAL  Result Date: 07/04/2020 CLINICAL DATA:  Screening. EXAM: DIGITAL SCREENING BILATERAL MAMMOGRAM WITH TOMOSYNTHESIS AND CAD TECHNIQUE: Bilateral screening digital craniocaudal and mediolateral oblique mammograms were obtained. Bilateral screening digital breast tomosynthesis was performed. The images were evaluated with computer-aided detection. COMPARISON:  Previous exam(s). ACR Breast Density Category b: There are scattered areas of fibroglandular density. FINDINGS: There are no findings suspicious for malignancy. The images were evaluated with computer-aided detection. IMPRESSION: No mammographic evidence of malignancy. A result letter of this screening mammogram will be mailed directly to the patient. RECOMMENDATION: Screening mammogram in one year. (Code:SM-B-01Y) BI-RADS CATEGORY  1: Negative. Electronically Signed   By: Denise Morgan M.D.   On: 07/04/2020 16:28    Assessment & Plan:   Denise Morgan was seen today for annual exam, hypertension,  osteoarthritis, back pain and hypothyroidism.  Diagnoses and all orders for this visit:  Essential hypertension- Her blood pressure is well controlled. -     Basic metabolic panel; Future -     TSH; Future -     TSH -     Basic metabolic panel -     potassium chloride (KLOR-CON) 8 MEQ tablet; Take 1 tablet (8 mEq total) by mouth 3 (three) times daily.  Acquired hypothyroidism- Her TSH is in the normal range.  She will stay on the current dose of levothyroxine. -     TSH; Future -     TSH  Diuretic-induced hypokalemia -     Basic metabolic panel; Future -     Basic metabolic panel -     potassium chloride (KLOR-CON) 8 MEQ tablet; Take 1 tablet (8 mEq total) by mouth 3 (three) times daily.  Primary osteoarthritis of both knees -  HYDROcodone-acetaminophen (NORCO/VICODIN) 5-325 MG tablet; Take 1-2 tablets by mouth every 6 (six) hours as needed for moderate pain.  Chronic bilateral low back pain without sciatica -     HYDROcodone-acetaminophen (NORCO/VICODIN) 5-325 MG tablet; Take 1-2 tablets by mouth every 6 (six) hours as needed for moderate pain.  Chronic pain syndrome -     HYDROcodone-acetaminophen (NORCO/VICODIN) 5-325 MG tablet; Take 1-2 tablets by mouth every 6 (six) hours as needed for moderate pain.  Primary osteoarthritis of both hips -     HYDROcodone-acetaminophen (NORCO/VICODIN) 5-325 MG tablet; Take 1-2 tablets by mouth every 6 (six) hours as needed for moderate pain.  Encounter for long-term opiate analgesic use- Will monitor for compliance and will screen for substance abuse. -     Urine drugs of abuse scrn w alc, routine (Ref Lab); Future -     Urine drugs of abuse scrn w alc, routine (Ref Lab)  Routine general medical examination at a health care facility- Exam completed, labs reviewed, vaccines reviewed, cancer screenings addressed, patient education was given.  Tobacco abuse disorder -     Ambulatory Referral for Lung Cancer Scre  I have discontinued  Denise Morgan's [place]-Con M20. I am also having her start on potassium chloride. Additionally, I am having her maintain her albuterol, meloxicam, traZODone, indapamide, levothyroxine, and HYDROcodone-acetaminophen.  Meds ordered this encounter  Medications   HYDROcodone-acetaminophen (NORCO/VICODIN) 5-325 MG tablet    Sig: Take 1-2 tablets by mouth every 6 (six) hours as needed for moderate pain.    Dispense:  100 tablet    Refill:  0   potassium chloride (KLOR-CON) 8 MEQ tablet    Sig: Take 1 tablet (8 mEq total) by mouth 3 (three) times daily.    Dispense:  270 tablet    Refill:  0     Follow-up: Return in about 6 months (around 03/20/2021).  Scarlette Calico, MD

## 2020-09-18 LAB — TSH: TSH: 1.01 u[IU]/mL (ref 0.35–5.50)

## 2020-09-21 MED ORDER — POTASSIUM CHLORIDE ER 8 MEQ PO TBCR: 8.0000 meq | EXTENDED_RELEASE_TABLET | Freq: Three times a day (TID) | ORAL | 0 refills | Status: DC

## 2020-09-22 ENCOUNTER — Other Ambulatory Visit: Payer: Self-pay | Admitting: Internal Medicine

## 2020-09-22 LAB — URINE DRUGS OF ABUSE SCREEN W ALC, ROUTINE (REF LAB)
Amphetamines, Urine: NEGATIVE ng/mL
Barbiturate Quant, Ur: NEGATIVE ng/mL
Benzodiazepine Quant, Ur: NEGATIVE ng/mL
Cannabinoid Quant, Ur: NEGATIVE ng/mL
Cocaine (Metab.): NEGATIVE ng/mL
Ethanol, Urine: NEGATIVE %
Methadone Screen, Urine: NEGATIVE ng/mL
PCP Quant, Ur: NEGATIVE ng/mL
Propoxyphene: NEGATIVE ng/mL

## 2020-09-22 LAB — OPIATES CONFIRMATION, URINE: OPIATES: NEGATIVE

## 2020-10-09 ENCOUNTER — Other Ambulatory Visit: Payer: Self-pay | Admitting: Internal Medicine

## 2020-10-09 DIAGNOSIS — E039 Hypothyroidism, unspecified: Secondary | ICD-10-CM

## 2020-10-13 ENCOUNTER — Other Ambulatory Visit: Payer: Self-pay | Admitting: Internal Medicine

## 2020-10-13 DIAGNOSIS — I10 Essential (primary) hypertension: Secondary | ICD-10-CM

## 2020-10-15 ENCOUNTER — Telehealth: Payer: Self-pay | Admitting: Internal Medicine

## 2020-10-15 NOTE — Telephone Encounter (Signed)
    Patient would like to know why HYDROcodone-acetaminophen was discontinued.  Requesting refill  Pharmacy  [id number]- [place], [place]

## 2020-10-16 ENCOUNTER — Encounter: Payer: Self-pay | Admitting: Internal Medicine

## 2020-10-17 ENCOUNTER — Other Ambulatory Visit: Payer: Self-pay | Admitting: Internal Medicine

## 2020-10-18 ENCOUNTER — Other Ambulatory Visit: Payer: Self-pay | Admitting: Internal Medicine

## 2020-10-18 DIAGNOSIS — I10 Essential (primary) hypertension: Secondary | ICD-10-CM

## 2020-10-18 DIAGNOSIS — E876 Hypokalemia: Secondary | ICD-10-CM

## 2020-10-19 ENCOUNTER — Other Ambulatory Visit: Payer: Self-pay | Admitting: Internal Medicine

## 2020-10-19 DIAGNOSIS — G8929 Other chronic pain: Secondary | ICD-10-CM

## 2020-10-19 DIAGNOSIS — M160 Bilateral primary osteoarthritis of hip: Secondary | ICD-10-CM

## 2020-10-19 DIAGNOSIS — G894 Chronic pain syndrome: Secondary | ICD-10-CM

## 2020-10-19 DIAGNOSIS — M170 Bilateral primary osteoarthritis of knee: Secondary | ICD-10-CM

## 2020-10-19 DIAGNOSIS — M545 Low back pain, unspecified: Secondary | ICD-10-CM

## 2020-10-22 ENCOUNTER — Other Ambulatory Visit: Payer: Self-pay | Admitting: Internal Medicine

## 2020-10-22 DIAGNOSIS — Z79891 Long term (current) use of opiate analgesic: Secondary | ICD-10-CM

## 2020-10-23 ENCOUNTER — Other Ambulatory Visit: Payer: Self-pay | Admitting: Internal Medicine

## 2020-10-23 ENCOUNTER — Other Ambulatory Visit: Payer: 59

## 2020-10-23 ENCOUNTER — Other Ambulatory Visit: Payer: Self-pay

## 2020-10-23 DIAGNOSIS — G894 Chronic pain syndrome: Secondary | ICD-10-CM

## 2020-10-23 DIAGNOSIS — Z79891 Long term (current) use of opiate analgesic: Secondary | ICD-10-CM

## 2020-10-27 LAB — OPIATES CONFIRMATION, URINE: OPIATES: NEGATIVE

## 2020-10-27 LAB — URINE DRUGS OF ABUSE SCREEN W ALC, ROUTINE (REF LAB)
Amphetamines, Urine: NEGATIVE ng/mL
Barbiturate Quant, Ur: NEGATIVE ng/mL
Benzodiazepine Quant, Ur: NEGATIVE ng/mL
Cannabinoid Quant, Ur: NEGATIVE ng/mL
Cocaine (Metab.): NEGATIVE ng/mL
Ethanol, Urine: NEGATIVE %
Methadone Screen, Urine: NEGATIVE ng/mL
PCP Quant, Ur: NEGATIVE ng/mL
Propoxyphene: NEGATIVE ng/mL

## 2020-12-01 ENCOUNTER — Encounter: Payer: Self-pay | Admitting: Internal Medicine

## 2020-12-13 ENCOUNTER — Other Ambulatory Visit: Payer: Self-pay | Admitting: Internal Medicine

## 2020-12-13 DIAGNOSIS — E876 Hypokalemia: Secondary | ICD-10-CM

## 2020-12-13 DIAGNOSIS — I10 Essential (primary) hypertension: Secondary | ICD-10-CM

## 2021-01-10 ENCOUNTER — Other Ambulatory Visit: Payer: Self-pay | Admitting: Internal Medicine

## 2021-01-10 DIAGNOSIS — I10 Essential (primary) hypertension: Secondary | ICD-10-CM

## 2021-01-17 ENCOUNTER — Other Ambulatory Visit: Payer: Self-pay | Admitting: Internal Medicine

## 2021-01-17 DIAGNOSIS — G894 Chronic pain syndrome: Secondary | ICD-10-CM

## 2021-01-18 ENCOUNTER — Other Ambulatory Visit: Payer: Self-pay | Admitting: Internal Medicine

## 2021-01-18 DIAGNOSIS — M160 Bilateral primary osteoarthritis of hip: Secondary | ICD-10-CM

## 2021-01-18 DIAGNOSIS — G894 Chronic pain syndrome: Secondary | ICD-10-CM

## 2021-01-18 DIAGNOSIS — M170 Bilateral primary osteoarthritis of knee: Secondary | ICD-10-CM

## 2021-01-18 DIAGNOSIS — G8929 Other chronic pain: Secondary | ICD-10-CM

## 2021-02-25 ENCOUNTER — Emergency Department (HOSPITAL_COMMUNITY): Payer: 59

## 2021-02-25 ENCOUNTER — Emergency Department (HOSPITAL_COMMUNITY)
Admission: EM | Admit: 2021-02-25 | Discharge: 2021-02-26 | Disposition: A | Discharge status: Home / Self Care | Payer: 59 | Attending: Emergency Medicine | Admitting: Emergency Medicine

## 2021-02-25 ENCOUNTER — Encounter (HOSPITAL_COMMUNITY): Payer: Self-pay | Admitting: Emergency Medicine

## 2021-02-25 ENCOUNTER — Other Ambulatory Visit: Payer: Self-pay

## 2021-02-25 DIAGNOSIS — F17210 Nicotine dependence, cigarettes, uncomplicated: Secondary | ICD-10-CM | POA: Diagnosis not present

## 2021-02-25 DIAGNOSIS — Z20822 Contact with and (suspected) exposure to covid-19: Secondary | ICD-10-CM | POA: Insufficient documentation

## 2021-02-25 DIAGNOSIS — R072 Precordial pain: Secondary | ICD-10-CM | POA: Diagnosis present

## 2021-02-25 DIAGNOSIS — E039 Hypothyroidism, unspecified: Secondary | ICD-10-CM | POA: Insufficient documentation

## 2021-02-25 DIAGNOSIS — J4520 Mild intermittent asthma, uncomplicated: Secondary | ICD-10-CM | POA: Diagnosis not present

## 2021-02-25 DIAGNOSIS — I10 Essential (primary) hypertension: Secondary | ICD-10-CM | POA: Diagnosis not present

## 2021-02-25 DIAGNOSIS — J40 Bronchitis, not specified as acute or chronic: Secondary | ICD-10-CM | POA: Insufficient documentation

## 2021-02-25 DIAGNOSIS — Z91040 Latex allergy status: Secondary | ICD-10-CM | POA: Insufficient documentation

## 2021-02-25 LAB — CBC
HCT: 37.8 % (ref 36.0–46.0)
Hemoglobin: 12.7 g/dL (ref 12.0–15.0)
MCH: 34.7 pg — ABNORMAL HIGH (ref 26.0–34.0)
MCHC: 33.6 g/dL (ref 30.0–36.0)
MCV: 103.3 fL — ABNORMAL HIGH (ref 80.0–100.0)
Platelets: 240 10*3/uL (ref 150–400)
RBC: 3.66 MIL/uL — ABNORMAL LOW (ref 3.87–5.11)
RDW: 13.2 % (ref 11.5–15.5)
WBC: 7.8 10*3/uL (ref 4.0–10.5)
nRBC: 0 % (ref 0.0–0.2)

## 2021-02-25 LAB — BASIC METABOLIC PANEL
Anion gap: 10 (ref 5–15)
BUN: 20 mg/dL (ref 6–20)
CO2: 21 mmol/L — ABNORMAL LOW (ref 22–32)
Calcium: 8.9 mg/dL (ref 8.9–10.3)
Chloride: 105 mmol/L (ref 98–111)
Creatinine, Ser: 0.91 mg/dL (ref 0.44–1.00)
GFR, Estimated: >60 mL/min (ref >60)
Glucose, Bld: 91 mg/dL (ref 70–99)
Potassium: 3.8 mmol/L (ref 3.5–5.1)
Sodium: 136 mmol/L (ref 135–145)

## 2021-02-25 LAB — RESP PANEL BY RT-PCR (FLU A&B, COVID) ARPGX2
Influenza A by PCR: NEGATIVE
Influenza B by PCR: NEGATIVE
SARS Coronavirus 2 by RT PCR: NEGATIVE

## 2021-02-25 LAB — TROPONIN I (HIGH SENSITIVITY): Troponin I (High Sensitivity): 4 ng/L (ref <18)

## 2021-02-25 NOTE — ED Triage Notes (Signed)
Pt c/o chest pain and shortness of breath, worsened by coughing.

## 2021-02-26 ENCOUNTER — Emergency Department (HOSPITAL_COMMUNITY): Payer: 59

## 2021-02-26 LAB — TROPONIN I (HIGH SENSITIVITY): Troponin I (High Sensitivity): 4 ng/L (ref <18)

## 2021-02-26 MED ORDER — ONDANSETRON HCL 4 MG/2ML IJ SOLN
4.0000 mg | Freq: Once | INTRAMUSCULAR | Status: AC
  Administered 2021-02-26: 06:00:00 4 mg via INTRAVENOUS
  Filled 2021-02-26: qty 2

## 2021-02-26 MED ORDER — IPRATROPIUM-ALBUTEROL 0.5-2.5 (3) MG/3ML IN SOLN
3.0000 mL | Freq: Once | RESPIRATORY_TRACT | Status: AC
  Administered 2021-02-26: 06:00:00 3 mL via RESPIRATORY_TRACT
  Filled 2021-02-26: qty 3

## 2021-02-26 MED ORDER — METHYLPREDNISOLONE SODIUM SUCC 125 MG IJ SOLR
125.0000 mg | Freq: Every day | INTRAMUSCULAR | Status: DC
  Administered 2021-02-26: 06:00:00 125 mg via INTRAVENOUS
  Filled 2021-02-26: qty 2

## 2021-02-26 MED ORDER — ALBUTEROL SULFATE HFA 108 (90 BASE) MCG/ACT IN AERS: 1.0000 | INHALATION_SPRAY | Freq: Four times a day (QID) | RESPIRATORY_TRACT | 0 refills | Status: DC | PRN

## 2021-02-26 MED ORDER — AZITHROMYCIN 250 MG PO TABS: 250.0000 mg | ORAL_TABLET | Freq: Every day | ORAL | 0 refills | Status: DC

## 2021-02-26 MED ORDER — IOHEXOL 350 MG/ML SOLN
61.0000 mL | Freq: Once | INTRAVENOUS | Status: AC | PRN
  Administered 2021-02-26: 06:00:00 61 mL via INTRAVENOUS

## 2021-02-26 MED ORDER — PREDNISONE 10 MG PO TABS: 20.0000 mg | ORAL_TABLET | Freq: Every day | ORAL | 0 refills | Status: AC

## 2021-02-26 NOTE — ED Provider Notes (Signed)
Emergency Department Provider Note   I have reviewed the triage vital signs and the nursing notes.   HISTORY  Chief Complaint Chest Pain and Shortness of Breath   HPI Denise Morgan is a 55 y.o. female with PMH or asthma, tobacco use (active) and prior PE (not anticoagulated) presents to the ED with cough, SOB, and CP.  Symptoms have been developing over the past several days although acutely worsened today.  Patient felt like she was getting a cold and flulike symptoms although did not have fever.  She has had minimally productive cough but was managing okay at home.  Today, she was lying down she suddenly felt much more short of breath and had some discomfort in her chest.  The pain is worse with coughing but also having discomfort without.  She describes somewhat pleuritic pain.  She has had some hemoptysis but attributed that to nose bleeding which that she then coughed up.  Symptoms are worse with exertion.  She describes a history of PE 10+ years prior in [place]. She denies any known reason for developing a PE. She was on blood thinner for most of that time. She was taken off of anticoagulation when she moved down to [place] but is unsure exactly why.    Past Medical History:  Diagnosis Date   Allergy    Anemia    Arthritis    Asthma    Blood transfusion without reported diagnosis    Depression    Frequent headaches    History of phlebitis    Hx of adenomatous polyp of colon 08/15/2015   Palpitations    heart monitor all normal per pt.2017   PE (pulmonary embolism)     Patient Active Problem List   Diagnosis Date Noted   Encounter for Micaylah Bertucci-term use of opiate analgesic 02/16/2020   Acquired hypothyroidism 02/12/2020   Diuretic-induced hypokalemia 02/12/2020   Hyperthyroidism 04/21/2016   Essential hypertension 04/07/2016   Snoring 04/07/2016   Hx of adenomatous polyp of colon 08/15/2015   Asthma, mild intermittent 05/29/2015   Hyperlipidemia with target LDL  less than 130 05/29/2015   Subacromial bursitis 04/18/2014   Obesity, morbid ([place]) 02/16/2013   Routine general medical examination at a health care facility 02/16/2013   Back pain, chronic 02/16/2013   Degenerative joint disease (DJD) of hip 02/16/2013   Tobacco abuse disorder 11/19/2012   Visit for screening mammogram 11/17/2012   DJD (degenerative joint disease) of knee 11/17/2012   Chronic pain syndrome 11/17/2012    Past Surgical History:  Procedure Laterality Date   ABDOMINAL HYSTERECTOMY  03/01/2004   CHOLECYSTECTOMY     COLONOSCOPY W/ POLYPECTOMY  07/2020    Allergies Latex  Family History  Problem Relation Age of Onset   Colon polyps Mother    Ovarian cancer Mother    Arthritis Mother    Colon cancer Maternal Aunt    Esophageal cancer Neg Hx    Rectal cancer Neg Hx    Stomach cancer Neg Hx     Social History Social History   Tobacco Use   Smoking status: Light Smoker    Packs/day: 0.50    Years: 30.00    Pack years: 15.00    Types: Cigarettes    Last attempt to quit: 08/22/2015    Years since quitting: 5.5   Smokeless tobacco: Never  Substance Use Topics   Alcohol use: Yes    Alcohol/week: 7.0 standard drinks    Types: 2 Glasses of wine, 5  Shots of liquor per week    Comment: social   Drug use: No    Review of Systems  Constitutional: No fever/chills Eyes: No visual changes. ENT: No sore throat. Cardiovascular: Positive chest pain. Respiratory: Positive shortness of breath and cough.  Gastrointestinal: No abdominal pain.  No nausea, no vomiting.  No diarrhea.  No constipation. Genitourinary: Negative for dysuria. Musculoskeletal: Negative for back pain. Skin: Negative for rash. Neurological: Negative for headaches, focal weakness or numbness.  10-point ROS otherwise negative.  ____________________________________________   PHYSICAL EXAM:  VITAL SIGNS: ED Triage Vitals [02/25/21 1925]  Enc Vitals Group     BP (!) 150/95     Pulse  Rate (!) 102     Resp (!) 25     Temp 99.1 F (37.3 C)     Temp Source Oral     SpO2 95 %    Constitutional: Alert and oriented. Patient appears SOB on initial exam after walking back from the restroom.  Eyes: Conjunctivae are normal.  Head: Atraumatic. Nose: No congestion/rhinnorhea. Mouth/Throat: Mucous membranes are moist.  Neck: No stridor.   Cardiovascular: Tachycardia. Good peripheral circulation. Grossly normal heart sounds.   Respiratory: Increased respiratory effort.  No retractions. Lungs somewhat diminished bilaterally. End-expiratory wheezing on exam.  Gastrointestinal: Soft and nontender. No distention.  Musculoskeletal: No lower extremity tenderness nor edema. No gross deformities of extremities. Neurologic:  Normal speech and language. No gross focal neurologic deficits are appreciated.  Skin:  Skin is warm, dry and intact. No rash noted.   ____________________________________________   LABS (all labs ordered are listed, but only abnormal results are displayed)  Labs Reviewed  BASIC METABOLIC PANEL - Abnormal; Notable for the following components:      Result Value   CO2 21 (*)    All other components within normal limits  CBC - Abnormal; Notable for the following components:   RBC 3.66 (*)    MCV 103.3 (*)    MCH 34.7 (*)    All other components within normal limits  RESP PANEL BY RT-PCR (FLU A&B, COVID) [id number]  TROPONIN I (HIGH SENSITIVITY)  TROPONIN I (HIGH SENSITIVITY)   ____________________________________________  EKG   EKG Interpretation  Date/Time:  Wednesday February 25 2021 19:21:58 EST Ventricular Rate:  96 PR Interval:  170 QRS Duration: 86 QT Interval:  340 QTC Calculation: 429 R Axis:   65 Text Interpretation: Normal sinus rhythm Cannot rule out Anterior infarct , age undetermined T wave abnormality, consider inferior ischemia Abnormal ECG When compared with ECG of 29-Nov-2013 09:33, PREVIOUS ECG IS PRESENT Similar to 2015 tracing  Confirmed by Nanda Quinton [phone]) on 02/26/2021 5:22:19 AM        ____________________________________________  RADIOLOGY  CT Angio Chest PE W and/or Wo Contrast  Result Date: 02/26/2021 CLINICAL DATA:  Shortness of breath and chest tightness. History of pulmonary embolism EXAM: CT ANGIOGRAPHY CHEST WITH CONTRAST TECHNIQUE: Multidetector CT imaging of the chest was performed using the standard protocol during bolus administration of intravenous contrast. Multiplanar CT image reconstructions and MIPs were obtained to evaluate the vascular anatomy. CONTRAST:  33mL OMNIPAQUE IOHEXOL 350 MG/ML SOLN COMPARISON:  None. FINDINGS: Cardiovascular: Satisfactory opacification of the pulmonary arteries to the segmental level. No evidence of pulmonary embolism. Normal heart size. No pericardial effusion. Mediastinum/Nodes: Negative for adenopathy Lungs/Pleura: Generalized airway thickening likely related to history of asthma. Mild atelectasis. There is no edema, consolidation, effusion, or pneumothorax. Upper Abdomen: No acute finding.  Partially covered cholecystectomy Musculoskeletal: Generalized thoracic disc  space narrowing and endplate ridging. Review of the MIP images confirms the above findings. IMPRESSION: 1. Negative for pulmonary embolism. 2. Generalized airway thickening. Electronically Signed   By: Jorje Guild M.D.   On: 02/26/2021 06:39    ____________________________________________   PROCEDURES  Procedure(s) performed:   Procedures  None  ____________________________________________   INITIAL IMPRESSION / ASSESSMENT AND PLAN / ED COURSE  Pertinent labs & imaging results that were available during my care of the patient were reviewed by me and considered in my medical decision making (see chart for details).   Patient presents emergency department with chest pain and shortness of breath.  She has some tightness on exam with occasional wheezing although would not call her wheezing  significant.  She is not hypoxemic.  Arrives mildly tachycardic.  Seems most consistent with acute bronchitis/asthma.  Will cover with steroid and DuoNeb here.  Her COVID and flu are negative from the MSE process.  Her troponins are negative x2.  Very low suspicion for ACS.  She does not appear acutely volume overloaded to suggest CHF exacerbation.  Interestingly, the patient does have history of PE and is not on anticoagulation after being anticoagulated for 10+ years in [place].  She tells me that the symptoms do remind her of when she was diagnosed with PE.  Given this, along with fairly minimal wheezing, plan for CTA of the chest as patient seems at least moderate risk for PE by [place] and not appropriate for D-dimer to adequately r/o PE.   CTA negative for PE or other acute process. Patient looking better on re-evaluation. Plan for bronchitis mgmt at home and PCP follow up.  ____________________________________________  FINAL CLINICAL IMPRESSION(S) / ED DIAGNOSES  Final diagnoses:  Precordial pain  Bronchitis     MEDICATIONS GIVEN DURING THIS VISIT:  Medications  ipratropium-albuterol (DUONEB) 0.5-2.5 (3) MG/3ML nebulizer solution 3 mL (3 mLs Nebulization Given 02/26/21 0543)  ondansetron (ZOFRAN) injection 4 mg (4 mg Intravenous Given 02/26/21 0550)  iohexol (OMNIPAQUE) 350 MG/ML injection 61 mL (61 mLs Intravenous Contrast Given 02/26/21 0619)     NEW OUTPATIENT MEDICATIONS STARTED DURING THIS VISIT:  Discharge Medication List as of 02/26/2021  6:45 AM     START taking these medications   Details  azithromycin (ZITHROMAX) 250 MG tablet Take 1 tablet (250 mg total) by mouth daily. Take first 2 tablets together, then 1 every day until finished., Starting Thu 02/26/2021, Normal    predniSONE (DELTASONE) 10 MG tablet Take 2 tablets (20 mg total) by mouth daily for 4 days., Starting Fri 02/27/2021, Until Tue 03/03/2021, Normal        Note:  This document was prepared using Dragon  voice recognition software and may include unintentional dictation errors.  Nanda Quinton, MD, [facility] Emergency Medicine    Oaklen Thiam, [facility], MD 02/26/21 [phone]

## 2021-02-26 NOTE — Discharge Instructions (Signed)
You were seen in the emergency room today with trouble breathing and chest discomfort.  Your CT scan did not show any blood clots in the lungs or pneumonia.  I am treating you for bronchitis with steroid and antibiotics.  I also called in a refill of your albuterol.  Please follow close with your primary care doctor and return with any new or suddenly worsening symptoms.

## 2021-03-13 ENCOUNTER — Emergency Department (HOSPITAL_COMMUNITY): Payer: 59

## 2021-03-13 ENCOUNTER — Encounter: Payer: Self-pay | Admitting: Internal Medicine

## 2021-03-13 ENCOUNTER — Emergency Department (HOSPITAL_COMMUNITY)
Admission: EM | Admit: 2021-03-13 | Discharge: 2021-03-14 | Disposition: A | Discharge status: Home / Self Care | Payer: 59 | Attending: Student | Admitting: Student

## 2021-03-13 ENCOUNTER — Encounter (HOSPITAL_COMMUNITY): Payer: Self-pay | Admitting: *Deleted

## 2021-03-13 ENCOUNTER — Other Ambulatory Visit: Payer: Self-pay

## 2021-03-13 DIAGNOSIS — J45909 Unspecified asthma, uncomplicated: Secondary | ICD-10-CM | POA: Diagnosis not present

## 2021-03-13 DIAGNOSIS — Z91040 Latex allergy status: Secondary | ICD-10-CM | POA: Diagnosis not present

## 2021-03-13 DIAGNOSIS — Z7952 Long term (current) use of systemic steroids: Secondary | ICD-10-CM | POA: Insufficient documentation

## 2021-03-13 DIAGNOSIS — I10 Essential (primary) hypertension: Secondary | ICD-10-CM | POA: Insufficient documentation

## 2021-03-13 DIAGNOSIS — R062 Wheezing: Secondary | ICD-10-CM | POA: Diagnosis not present

## 2021-03-13 DIAGNOSIS — R059 Cough, unspecified: Secondary | ICD-10-CM | POA: Diagnosis present

## 2021-03-13 DIAGNOSIS — R052 Subacute cough: Secondary | ICD-10-CM | POA: Diagnosis not present

## 2021-03-13 NOTE — ED Provider Triage Note (Signed)
Emergency Medicine Provider Triage Evaluation Note  Denise Morgan , a 56 y.o. female  was evaluated in triage.  Pt complains of continued cough congestion shortness of breath wheezing.  She was seen here on 12/28 and had a work-up including a CT PE study that showed diffuse airway thickening.  She states that her symptoms of neither improved nor worsened and is simply continued since that time.  She is taking antitussives and medications prescribed at prior visit.  Review of Systems  Positive: Wheezing, cough Negative: Fever  Physical Exam  Ht 5\' 6"  (1.676 m)    Wt 111.1 kg    LMP 03/01/2004    BMI 39.53 kg/m  Gen:   Awake, no distress   Resp:  Normal effort, coughs frequently, faint end expiratory wheezing MSK:   Moves extremities without difficulty  Other:  No crackles on exam.  Medical Decision Making  Medically screening exam initiated at 4:48 PM.  Appropriate orders placed.  EMMILY PELLEGRIN was informed that the remainder of the evaluation will be completed by another provider, this initial triage assessment does not replace that evaluation, and the importance of remaining in the ED until their evaluation is complete.  Mildly elevated temperature and prolonged cough for 3 weeks now.  Will obtain chest x-ray   She is already had COVID and influenza test 12/28 which is negative   Tedd Sias, [place] 03/13/21 1651

## 2021-03-13 NOTE — ED Triage Notes (Signed)
The pt has had a cough and chest pain shortness of breath  she was in here dec 28th for the same productive cough all colors  no templmp none

## 2021-03-14 MED ORDER — BENZONATATE 100 MG PO CAPS: 100.0000 mg | ORAL_CAPSULE | Freq: Three times a day (TID) | ORAL | 0 refills | Status: DC

## 2021-03-14 MED ORDER — PREDNISONE 20 MG PO TABS: ORAL_TABLET | ORAL | 0 refills | Status: DC

## 2021-03-14 NOTE — ED Provider Notes (Signed)
[facility] Provider Note   CSN: [id number] Arrival date & time: 03/13/21  1534     History  Chief Complaint  Patient presents with   Cough    Denise Morgan is a 56 y.o. female.  The history is provided by the patient and medical records. No language interpreter was used.  Cough  56 year old female with significant medical history including obesity, tobacco use, chronic pain syndrome, asthma, hyperlipidemia, hypertension, thyroid disease presenting today with complaints of cough.  The patient was previously in the ED on 12/28 for her complaints of cough and shortness of breath.  During her visit she had a full work-up including chest CT angiogram which ruled out PE but does show diffuse airway thickening.  She was prescribed antitussive and supportive medication in which she has been taking.  She also had negative flu and COVID during the visit.  Patient states she still has a lingering cough.  Cough happening throughout the day, sometimes productive and due to coughing so much, throat was irritative.  She occasionally coughs up specks of blood.  She also endorsed wheezing, she has been using the inhaler as previously prescribed but it only helped temporarily.  She does not complain of any fever or body aches exertional chest pain.  Home Medications Prior to Admission medications   Medication Sig Start Date End Date Taking? Authorizing Provider  acetaminophen (TYLENOL) 500 MG tablet Take 500 mg by mouth every 6 (six) hours as needed for mild pain.    [provider]  albuterol (VENTOLIN HFA) 108 (90 Base) MCG/ACT inhaler Inhale 1-2 puffs into the lungs every 6 (six) hours as needed for wheezing or shortness of breath. 02/26/21   Long, Wonda Olds, MD  azithromycin (ZITHROMAX) 250 MG tablet Take 1 tablet (250 mg total) by mouth daily. Take first 2 tablets together, then 1 every day until finished. 02/26/21   Long, Wonda Olds, MD  indapamide  (LOZOL) 2.5 MG tablet TAKE 1 TABLET BY MOUTH EVERY DAY Patient taking differently: Take 2.5 mg by mouth daily. 01/10/21   Janith Lima, MD  levothyroxine (SYNTHROID) 50 MCG tablet TAKE 1 TABLET BY MOUTH EVERY DAY BEFORE BREAKFAST Patient taking differently: Take 50 mcg by mouth daily before breakfast. 10/09/20   Janith Lima, MD  meloxicam (MOBIC) 15 MG tablet TAKE 1 TABLET (15 MG TOTAL) BY MOUTH DAILY. 01/19/21   Janith Lima, MD  potassium chloride (KLOR-CON) 8 MEQ tablet TAKE 1 TABLET (8 MEQ TOTAL) BY MOUTH 3 (THREE) TIMES DAILY. 12/13/20   Janith Lima, MD  traZODone (DESYREL) 100 MG tablet TAKE 1 TABLET BY MOUTH EVERYDAY AT BEDTIME Patient taking differently: Take 100 mg by mouth at bedtime. 01/17/21   Janith Lima, MD      Allergies    Latex    Review of Systems   Review of Systems  Respiratory:  Positive for cough.   All other systems reviewed and are negative.  Physical Exam Updated Vital Signs BP 135/83 (BP Location: Right Arm)    Pulse 65    Temp 98.2 F (36.8 C) (Oral)    Resp 17    Ht 5\' 6"  (1.676 m)    Wt 111.1 kg    LMP 03/01/2004    SpO2 96%    BMI 39.53 kg/m  Physical Exam Vitals and nursing note reviewed.  Constitutional:      General: She is not in acute distress.    Appearance: She is  well-developed.  HENT:     Head: Atraumatic.  Eyes:     Conjunctiva/sclera: Conjunctivae normal.  Cardiovascular:     Rate and Rhythm: Normal rate and regular rhythm.     Pulses: Normal pulses.     Heart sounds: Normal heart sounds.  Pulmonary:     Effort: Pulmonary effort is normal.     Breath sounds: Wheezing (Faint wheezing heard but lungs mostly normal) present.  Abdominal:     Palpations: Abdomen is soft.  Musculoskeletal:     Cervical back: Neck supple.  Skin:    Findings: No rash.  Neurological:     Mental Status: She is alert. Mental status is at baseline.  Psychiatric:        Mood and Affect: Mood normal.    ED Results / Procedures / Treatments    Labs (all labs ordered are listed, but only abnormal results are displayed) Labs Reviewed - No data to display  EKG None  Radiology DG Chest 2 View  Result Date: 03/13/2021 CLINICAL DATA:  cough 3 weeks EXAM: CHEST - 2 VIEW COMPARISON:  Chest x-ray 02/25/2021, CT chest 02/26/2021 FINDINGS: The heart and mediastinal contours are unchanged. No focal consolidation. No pulmonary edema. No pleural effusion. No pneumothorax. No acute osseous abnormality. IMPRESSION: No active cardiopulmonary disease. Electronically Signed   By: Iven Finn M.D.   On: 03/13/2021 17:30    Procedures Procedures    Medications Ordered in ED Medications - No data to display  ED Course/ Medical Decision Making/ A&P                           Medical Decision Making  BP 135/83 (BP Location: Right Arm)    Pulse 65    Temp 98.2 F (36.8 C) (Oral)    Resp 17    Ht 5\' 6"  (1.676 m)    Wt 111.1 kg    LMP 03/01/2004    SpO2 96%    BMI 39.53 kg/m   7:13 AM This is a 56 year old female with history of asthma, obesity, and hypertension who has had a lingering cough for more than a month.  It started after she developed cold symptoms and now still having lingering cough.  She was previously seen on December 28 for the same complaint.  I have reviewed patient's previous visit.  She has a work-up which includes chest CTA, COVID and flu test, and subsequently discharged home with symptomatic care-she is negative for PE.  Her cough is still lingering.  Patient however well-appearing on exam, some mild wheezing heard on lung exam but lungs are mostly clear.  Chest x-ray independently reviewed interpreted by me.  Chest x-ray unremarkable.  She is afebrile, vital signs stable, no hypoxia.  I have low suspicion for PE, ACS, ACE induced cough, or other acute emergent medical condition.  I suspect her symptom is likely a lingering effect of her previous cold symptoms.  Plan to provide another course of steroids, Tessalon for  cough medication, and give reassurance.  Encourage patient to continue to use inhaler as needed and she may follow-up with her PCP for further care.  Return precaution given.  I suspect her symptom is likely due to bronchitis.        Final Clinical Impression(s) / ED Diagnoses Final diagnoses:  Subacute cough    Rx / DC Orders ED Discharge Orders          Ordered    predniSONE (  DELTASONE) 20 MG tablet        03/14/21 0717    benzonatate (TESSALON) 100 MG capsule  Every 8 hours        03/14/21 0717              Domenic Moras, PA-C 03/14/21 [id number]    Teressa Lower, MD 03/14/21 1642

## 2021-03-14 NOTE — ED Notes (Signed)
PT c/o increasing difficulty breathing and coughing at this time. PT's O2 saturation rechecked with a result of 100% on room air. PT requesting nebulizer treatment at this time, stating that has helped in the past. Triage RN notified.

## 2021-03-14 NOTE — Discharge Instructions (Addendum)
You have symptoms likely lingering effects from your previous bronchitis infection.  You may take Tessalon cough medication and please continue with another course of steroid as it should help with alleviate your symptoms.  You may follow-up with your primary care provider for further care.

## 2021-03-16 ENCOUNTER — Ambulatory Visit (INDEPENDENT_AMBULATORY_CARE_PROVIDER_SITE_OTHER): Payer: 59 | Admitting: Internal Medicine

## 2021-03-16 ENCOUNTER — Encounter: Payer: Self-pay | Admitting: Internal Medicine

## 2021-03-16 ENCOUNTER — Other Ambulatory Visit: Payer: Self-pay

## 2021-03-16 DIAGNOSIS — R058 Other specified cough: Secondary | ICD-10-CM | POA: Insufficient documentation

## 2021-03-16 MED ORDER — HYDROCODONE BIT-HOMATROP MBR 5-1.5 MG/5ML PO SOLN: 5.0000 mL | Freq: Four times a day (QID) | ORAL | 0 refills | Status: DC | PRN

## 2021-03-16 NOTE — Progress Notes (Signed)
° °  Subjective:   Patient ID: Denise Morgan, female    DOB: 08-02-1965, 56 y.o.   MRN: [id number]  HPI The patient is a 56 YO female coming in for two ER follow ups. Still suffering with cough and not feeling well.   Review of Systems  Constitutional: Negative.   HENT:  Positive for congestion.   Eyes: Negative.   Respiratory:  Positive for cough. Negative for chest tightness and shortness of breath.   Cardiovascular:  Negative for chest pain, palpitations and leg swelling.  Gastrointestinal:  Negative for abdominal distention, abdominal pain, constipation, diarrhea, nausea and vomiting.  Musculoskeletal: Negative.   Skin: Negative.   Neurological:  Positive for headaches.  Psychiatric/Behavioral: Negative.     Objective:  Physical Exam Constitutional:      Appearance: She is well-developed. She is obese.  HENT:     Head: Normocephalic and atraumatic.  Cardiovascular:     Rate and Rhythm: Normal rate and regular rhythm.  Pulmonary:     Effort: Pulmonary effort is normal. No respiratory distress.     Breath sounds: Normal breath sounds. No wheezing or rales.  Abdominal:     General: Bowel sounds are normal. There is no distension.     Palpations: Abdomen is soft.     Tenderness: There is no abdominal tenderness. There is no rebound.  Musculoskeletal:     Cervical back: Normal range of motion.  Skin:    General: Skin is warm and dry.  Neurological:     Mental Status: She is alert and oriented to person, place, and time.     Coordination: Coordination normal.    Vitals:   03/16/21 1347  BP: 130/72  Pulse: 70  Resp: 18  SpO2: 98%  Weight: 243 lb 9.6 oz (110.5 kg)  Height: 5\' 6"  (1.676 m)    This visit occurred during the SARS-CoV-2 public health emergency.  Safety protocols were in place, including screening questions prior to the visit, additional usage of staff PPE, and extensive cleaning of exam room while observing appropriate contact time as indicated for  disinfecting solutions.   Assessment & Plan:

## 2021-03-16 NOTE — Assessment & Plan Note (Signed)
Lungs are clear. Symptoms are improving overall (no fever, less muscle aches, cough is improving but still bothersome). Rx hydrocodone cough syrup to help. Can finish prednisone however it is unclear this is needed.

## 2021-03-16 NOTE — Patient Instructions (Signed)
We have sent in hydrocodone cough syrup to use for the cough up to 3 times a day as needed. If we can get you to stop coughing as much we will get rid of the cough in the next few weeks.

## 2021-03-17 ENCOUNTER — Other Ambulatory Visit: Payer: Self-pay | Admitting: Internal Medicine

## 2021-03-17 DIAGNOSIS — T502X5A Adverse effect of carbonic-anhydrase inhibitors, benzothiadiazides and other diuretics, initial encounter: Secondary | ICD-10-CM

## 2021-03-17 DIAGNOSIS — I10 Essential (primary) hypertension: Secondary | ICD-10-CM

## 2021-03-17 DIAGNOSIS — E876 Hypokalemia: Secondary | ICD-10-CM

## 2021-04-01 ENCOUNTER — Other Ambulatory Visit: Payer: Self-pay | Admitting: Internal Medicine

## 2021-04-01 DIAGNOSIS — E039 Hypothyroidism, unspecified: Secondary | ICD-10-CM

## 2021-04-06 ENCOUNTER — Other Ambulatory Visit: Payer: Self-pay | Admitting: Internal Medicine

## 2021-04-06 DIAGNOSIS — I10 Essential (primary) hypertension: Secondary | ICD-10-CM

## 2021-04-16 ENCOUNTER — Other Ambulatory Visit: Payer: Self-pay | Admitting: Internal Medicine

## 2021-04-16 DIAGNOSIS — G894 Chronic pain syndrome: Secondary | ICD-10-CM

## 2021-04-17 ENCOUNTER — Other Ambulatory Visit: Payer: Self-pay | Admitting: Internal Medicine

## 2021-04-17 DIAGNOSIS — G894 Chronic pain syndrome: Secondary | ICD-10-CM

## 2021-04-17 DIAGNOSIS — M170 Bilateral primary osteoarthritis of knee: Secondary | ICD-10-CM

## 2021-04-17 DIAGNOSIS — M545 Low back pain, unspecified: Secondary | ICD-10-CM

## 2021-04-17 DIAGNOSIS — M160 Bilateral primary osteoarthritis of hip: Secondary | ICD-10-CM

## 2021-04-17 DIAGNOSIS — G8929 Other chronic pain: Secondary | ICD-10-CM

## 2021-04-20 ENCOUNTER — Ambulatory Visit (INDEPENDENT_AMBULATORY_CARE_PROVIDER_SITE_OTHER): Payer: 59

## 2021-04-20 ENCOUNTER — Other Ambulatory Visit: Payer: Self-pay

## 2021-04-20 ENCOUNTER — Encounter: Payer: Self-pay | Admitting: Internal Medicine

## 2021-04-20 ENCOUNTER — Ambulatory Visit (INDEPENDENT_AMBULATORY_CARE_PROVIDER_SITE_OTHER): Payer: 59 | Admitting: Internal Medicine

## 2021-04-20 VITALS — BP 132/86 | HR 70 | Temp 97.9°F | Ht 66.0 in | Wt 252.4 lb

## 2021-04-20 DIAGNOSIS — R058 Other specified cough: Secondary | ICD-10-CM

## 2021-04-20 DIAGNOSIS — M170 Bilateral primary osteoarthritis of knee: Secondary | ICD-10-CM

## 2021-04-20 DIAGNOSIS — R052 Subacute cough: Secondary | ICD-10-CM | POA: Insufficient documentation

## 2021-04-20 DIAGNOSIS — Z0000 Encounter for general adult medical examination without abnormal findings: Secondary | ICD-10-CM

## 2021-04-20 DIAGNOSIS — E039 Hypothyroidism, unspecified: Secondary | ICD-10-CM | POA: Diagnosis not present

## 2021-04-20 DIAGNOSIS — I10 Essential (primary) hypertension: Secondary | ICD-10-CM | POA: Diagnosis not present

## 2021-04-20 DIAGNOSIS — J4521 Mild intermittent asthma with (acute) exacerbation: Secondary | ICD-10-CM | POA: Diagnosis not present

## 2021-04-20 DIAGNOSIS — E785 Hyperlipidemia, unspecified: Secondary | ICD-10-CM

## 2021-04-20 DIAGNOSIS — G894 Chronic pain syndrome: Secondary | ICD-10-CM

## 2021-04-20 DIAGNOSIS — M160 Bilateral primary osteoarthritis of hip: Secondary | ICD-10-CM

## 2021-04-20 LAB — HEPATIC FUNCTION PANEL
ALT: 15 U/L (ref 0–35)
AST: 17 U/L (ref 0–37)
Albumin: 4.3 g/dL (ref 3.5–5.2)
Alkaline Phosphatase: 61 U/L (ref 39–117)
Bilirubin, Direct: 0.1 mg/dL (ref 0.0–0.3)
Total Bilirubin: 0.6 mg/dL (ref 0.2–1.2)
Total Protein: 7.6 g/dL (ref 6.0–8.3)

## 2021-04-20 LAB — CBC WITH DIFFERENTIAL/PLATELET
Basophils Absolute: 0.1 10*3/uL (ref 0.0–0.1)
Basophils Relative: 1.1 % (ref 0.0–3.0)
Eosinophils Absolute: 1.2 10*3/uL — ABNORMAL HIGH (ref 0.0–0.7)
Eosinophils Relative: 13.7 % — ABNORMAL HIGH (ref 0.0–5.0)
HCT: 41.4 % (ref 36.0–46.0)
Hemoglobin: 13.9 g/dL (ref 12.0–15.0)
Lymphocytes Relative: 35.5 % (ref 12.0–46.0)
Lymphs Abs: 3 10*3/uL (ref 0.7–4.0)
MCHC: 33.5 g/dL (ref 30.0–36.0)
MCV: 101.6 fl — ABNORMAL HIGH (ref 78.0–100.0)
Monocytes Absolute: 0.5 10*3/uL (ref 0.1–1.0)
Monocytes Relative: 5.6 % (ref 3.0–12.0)
Neutro Abs: 3.7 10*3/uL (ref 1.4–7.7)
Neutrophils Relative %: 44.1 % (ref 43.0–77.0)
Platelets: 259 10*3/uL (ref 150.0–400.0)
RBC: 4.08 Mil/uL (ref 3.87–5.11)
RDW: 13.2 % (ref 11.5–15.5)
WBC: 8.5 10*3/uL (ref 4.0–10.5)

## 2021-04-20 LAB — LIPID PANEL
Cholesterol: 267 mg/dL — ABNORMAL HIGH (ref 0–200)
HDL: 76.3 mg/dL (ref >39.00)
LDL Cholesterol: 174 mg/dL — ABNORMAL HIGH (ref 0–99)
NonHDL: 190.57
Total CHOL/HDL Ratio: 3
Triglycerides: 83 mg/dL (ref 0.0–149.0)
VLDL: 16.6 mg/dL (ref 0.0–40.0)

## 2021-04-20 LAB — TSH: TSH: 0.61 u[IU]/mL (ref 0.35–5.50)

## 2021-04-20 MED ORDER — TRELEGY ELLIPTA 200-62.5-25 MCG/ACT IN AEPB: 1.0000 | INHALATION_SPRAY | Freq: Every day | RESPIRATORY_TRACT | 1 refills | Status: DC

## 2021-04-20 MED ORDER — HYDROCODONE-ACETAMINOPHEN 5-325 MG PO TABS: 1.0000 | ORAL_TABLET | Freq: Three times a day (TID) | ORAL | 0 refills | Status: DC | PRN

## 2021-04-20 MED ORDER — ALBUTEROL SULFATE HFA 108 (90 BASE) MCG/ACT IN AERS: 1.0000 | INHALATION_SPRAY | Freq: Four times a day (QID) | RESPIRATORY_TRACT | 3 refills | Status: DC | PRN

## 2021-04-20 MED ORDER — METHYLPREDNISOLONE 4 MG PO TBPK: ORAL_TABLET | ORAL | 0 refills | Status: DC

## 2021-04-20 MED ORDER — PROMETHAZINE-DM 6.25-15 MG/5ML PO SYRP: 5.0000 mL | ORAL_SOLUTION | Freq: Four times a day (QID) | ORAL | 0 refills | Status: AC | PRN

## 2021-04-20 NOTE — Patient Instructions (Signed)
Asthma, Adult Asthma is a long-term (chronic) condition that causes recurrent episodes in which the airways become tight and narrow. The airways are the passages that lead from the nose and mouth down into the lungs. Asthma episodes, also called asthma attacks, can cause coughing, wheezing, shortness of breath, and chest pain. The airways can also fill with mucus. During an attack, it can be difficult to breathe. Asthma attacks can range from minor to life threatening. Asthma cannot be cured, but medicines and lifestyle changes can help control it and treat acute attacks. What are the causes? This condition is believed to be caused by inherited (genetic) and environmental factors, but its exact cause is not known. There are many things that can bring on an asthma attack or make asthma symptoms worse (triggers). Asthma triggers are different for each person. Common triggers include: Mold. Dust. Cigarette smoke. Cockroaches. Things that can cause allergy symptoms (allergens), such as animal dander or pollen from trees or grass. Air pollutants such as household cleaners, wood smoke, smog, or Advertising account planner. Cold air, weather changes, and winds (which increase molds and pollen in the air). Strong emotional expressions such as crying or laughing hard. Stress. Certain medicines (such as aspirin) or types of medicines (such as beta-blockers). Sulfites in foods and drinks. Foods and drinks that may contain sulfites include dried fruit, potato chips, and sparkling grape juice. Infections or inflammatory conditions such as the flu, a cold, or inflammation of the nasal membranes (rhinitis). Gastroesophageal reflux disease (GERD). Exercise or strenuous activity. What are the signs or symptoms? Symptoms of this condition may occur right after asthma is triggered or many hours later. Symptoms include: Wheezing. This can sound like whistling when you breathe. Excessive nighttime or early morning  coughing. Frequent or severe coughing with a common cold. Chest tightness. Shortness of breath. Tiredness (fatigue) with minimal activity. How is this diagnosed? This condition is diagnosed based on: Your medical history. A physical exam. Tests, which may include: Lung function studies and pulmonary studies (spirometry). These tests can evaluate the flow of air in your lungs. Allergy tests. Imaging tests, such as X-rays. How is this treated? There is no cure for this condition, but treatment can help control your symptoms. Treatment for asthma usually involves: Identifying and avoiding your asthma triggers. Using medicines to control your symptoms. Generally, two types of medicines are used to treat asthma: Controller medicines. These help prevent asthma symptoms from occurring. They are usually taken every day. Fast-acting reliever or rescue medicines. These quickly relieve asthma symptoms by widening the narrow and tight airways. They are used as needed and provide short-term relief. Using supplemental oxygen. This may be needed during a severe episode. Using other medicines, such as: Allergy medicines, such as antihistamines, if your asthma attacks are triggered by allergens. Immune medicines (immunomodulators). These are medicines that help control the immune system. Creating an asthma action plan. An asthma action plan is a written plan for managing and treating your asthma attacks. This plan includes: A list of your asthma triggers and how to avoid them. Information about when medicines should be taken and when their dosage should be changed. Instructions about using a device called a peak flow meter. A peak flow meter measures how well the lungs are working and the severity of your asthma. It helps you monitor your condition. Follow these instructions at home: Controlling your home environment Control your home environment in the following ways to help avoid triggers and prevent  asthma attacks: Change your heating  and air conditioning filter regularly. °Limit your use of fireplaces and wood stoves. °Get rid of pests (such as roaches and mice) and their droppings. °Throw away plants if you see mold on them. °Clean floors and dust surfaces regularly. Use unscented cleaning products. °Try to have someone else vacuum for you regularly. Stay out of rooms while they are being vacuumed and for a short while afterward. If you vacuum, use a dust mask from a hardware store, a double-layered or microfilter vacuum cleaner bag, or a vacuum cleaner with a HEPA filter. °Replace carpet with wood, tile, or vinyl flooring. Carpet can trap dander and dust. °Use allergy-proof pillows, mattress covers, and box spring covers. °Keep your bedroom a trigger-free room. °Avoid pets and keep windows closed when allergens are in the air. °Wash beddings every week in hot water and dry them in a dryer. °Use blankets that are made of polyester or cotton. °Clean bathrooms and kitchens with bleach. If possible, have someone repaint the walls in these rooms with mold-resistant paint. Stay out of the rooms that are being cleaned and painted. °Wash your hands often with soap and water. If soap and water are not available, use hand sanitizer. °Do not allow anyone to smoke in your home. °General instructions °Take over-the-counter and prescription medicines only as told by your health care provider. °Speak with your health care provider if you have questions about how or when to take the medicines. °Make note if you are requiring more frequent dosages. °Do not use any products that contain nicotine or tobacco, such as cigarettes and e-cigarettes. If you need help quitting, ask your health care provider. Also, avoid being exposed to secondhand smoke. °Use a peak flow meter as told by your health care provider. Record and keep track of the readings. °Understand and use the asthma action plan to help minimize, or stop an asthma  attack, without needing to seek medical care. °Make sure you stay up to date on your yearly vaccinations as told by your health care provider. This may include vaccines for the flu and pneumonia. °Avoid outdoor activities when allergen counts are high and when air quality is low. °Wear a ski mask that covers your nose and mouth during outdoor winter activities. Exercise indoors on cold days if you can. °Warm up before exercising, and take time for a cool-down period after exercise. °Keep all follow-up visits as told by your health care provider. This is important. °Where to find more information °For information about asthma, turn to the Centers for Disease Control and Prevention at www.cdc.gov/asthma/faqs °For air quality information, turn to AirNow at airnow.gov °Contact a health care provider if: °You have wheezing, shortness of breath, or a cough even while you are taking medicine to prevent attacks. °The mucus you cough up (sputum) is thicker than usual. °Your sputum changes from clear or white to yellow, green, gray, or bloody. °Your medicines are causing side effects, such as a rash, itching, swelling, or trouble breathing. °You need to use a reliever medicine more than 2-3 times a week. °Your peak flow reading is still at 50-79% of your personal best after following your action plan for 1 hour. °You have a fever. °Get help right away if: °You are getting worse and do not respond to treatment during an asthma attack. °You are short of breath when at rest or when doing very little physical activity. °You have difficulty eating, drinking, or talking. °You have chest pain or tightness. °You develop a fast heartbeat or   palpitations. You have a bluish color to your lips or fingernails. You are light-headed or dizzy, or you faint. Your peak flow reading is less than 50% of your personal best. You feel too tired to breathe normally. Summary Asthma is a long-term (chronic) condition that causes recurrent  episodes in which the airways become tight and narrow. These episodes can cause coughing, wheezing, shortness of breath, and chest pain. Asthma cannot be cured, but medicines and lifestyle changes can help control it and treat acute attacks. Make sure you understand how to avoid triggers and how and when to use your medicines. Asthma attacks can range from minor to life threatening. Get help right away if you have an asthma attack and do not respond to treatment with your usual rescue medicines. This information is not intended to replace advice given to you by your health care provider. Make sure you discuss any questions you have with your health care provider. Document Revised: 11/16/2019 Document Reviewed: 06/20/2019 Elsevier Patient Education  2022 [employer].

## 2021-04-20 NOTE — Progress Notes (Signed)
Subjective:  Patient ID: Denise Morgan, female    DOB: Sep 23, 1965  Age: 56 y.o. MRN: [id number]  CC: Hypothyroidism, Annual Exam, Cough, Asthma, and Osteoarthritis  This visit occurred during the SARS-CoV-2 public health emergency.  Safety protocols were in place, including screening questions prior to the visit, additional usage of staff PPE, and extensive cleaning of exam room while observing appropriate contact time as indicated for disinfecting solutions.    HPI Denise Morgan presents for a CPX and f/up -  She complains of a persistent cough that is intermittently productive of yellow phlegm. She has taken 2 antibiotics. The only inh she is using is a SABA. She complains MSK pain that interferes with her daily activities.  Outpatient Medications Prior to Visit  Medication Sig Dispense Refill   indapamide (LOZOL) 2.5 MG tablet Take 1 tablet (2.5 mg total) by mouth daily. 90 tablet 0   levothyroxine (SYNTHROID) 50 MCG tablet Take 1 tablet (50 mcg total) by mouth daily before breakfast. 90 tablet 0   meloxicam (MOBIC) 15 MG tablet TAKE 1 TABLET (15 MG TOTAL) BY MOUTH DAILY. 90 tablet 0   potassium chloride (KLOR-CON) 8 MEQ tablet TAKE 1 TABLET (8 MEQ TOTAL) BY MOUTH 3 (THREE) TIMES DAILY. 270 tablet 0   traZODone (DESYREL) 100 MG tablet TAKE 1 TABLET BY MOUTH EVERYDAY AT BEDTIME 90 tablet 0   acetaminophen (TYLENOL) 500 MG tablet Take 500 mg by mouth every 6 (six) hours as needed for mild pain.     albuterol (VENTOLIN HFA) 108 (90 Base) MCG/ACT inhaler Inhale 1-2 puffs into the lungs every 6 (six) hours as needed for wheezing or shortness of breath. 6.7 g 0   benzonatate (TESSALON) 100 MG capsule Take 1 capsule (100 mg total) by mouth every 8 (eight) hours. (Patient not taking: Reported on 04/20/2021) 21 capsule 0   HYDROcodone bit-homatropine (HYDROMET) 5-1.5 MG/5ML syrup Take 5 mLs by mouth every 6 (six) hours as needed for cough. (Patient not taking: Reported on 04/20/2021) 120 mL 0    predniSONE (DELTASONE) 20 MG tablet 3 tabs po day one, then 2 tabs daily x 4 days (Patient not taking: Reported on 04/20/2021) 11 tablet 0   No facility-administered medications prior to visit.    ROS Review of Systems  Constitutional:  Negative for chills, diaphoresis, fatigue and fever.  HENT: Negative.    Eyes: Negative.   Respiratory:  Positive for cough. Negative for chest tightness, shortness of breath, wheezing and stridor.   Cardiovascular:  Negative for chest pain, palpitations and leg swelling.  Gastrointestinal:  Negative for abdominal pain, constipation, diarrhea, nausea and vomiting.  Genitourinary:  Negative for difficulty urinating and urgency.  Musculoskeletal:  Positive for arthralgias. Negative for back pain and myalgias.  Skin: Negative.  Negative for color change and pallor.  Neurological: Negative.  Negative for dizziness, weakness and light-headedness.  Hematological:  Negative for adenopathy. Does not bruise/bleed easily.  Psychiatric/Behavioral: Negative.     Objective:  BP 132/86 (BP Location: Left Arm, Patient Position: Sitting, Cuff Size: Large)    Pulse 70    Temp 97.9 F (36.6 C) (Oral)    Ht 5\' 6"  (1.676 m)    Wt 252 lb 6.4 oz (114.5 kg)    LMP 03/01/2004    SpO2 97%    BMI 40.74 kg/m   BP Readings from Last 3 Encounters:  04/20/21 132/86  03/16/21 130/72  03/14/21 140/88    Wt Readings from Last 3 Encounters:  04/20/21 252  lb 6.4 oz (114.5 kg)  03/16/21 243 lb 9.6 oz (110.5 kg)  03/13/21 244 lb 14.9 oz (111.1 kg)    Physical Exam Vitals reviewed.  Constitutional:      General: She is not in acute distress.    Appearance: Normal appearance. She is obese. She is not ill-appearing, toxic-appearing or diaphoretic.  HENT:     Nose: Nose normal.     Mouth/Throat:     Mouth: Mucous membranes are moist.  Eyes:     General: No scleral icterus.    Conjunctiva/sclera: Conjunctivae normal.  Cardiovascular:     Rate and Rhythm: Normal rate and  regular rhythm.     Heart sounds: No murmur heard. Pulmonary:     Effort: Pulmonary effort is normal.     Breath sounds: No decreased air movement. Examination of the right-upper field reveals rhonchi. Examination of the left-upper field reveals rhonchi. Examination of the right-middle field reveals rhonchi. Examination of the left-middle field reveals rhonchi. Examination of the right-lower field reveals rhonchi. Examination of the left-lower field reveals rhonchi. Rhonchi present. No decreased breath sounds, wheezing or rales.  Abdominal:     General: Abdomen is flat.     Palpations: There is no mass.     Tenderness: There is no abdominal tenderness. There is no guarding.     Hernia: No hernia is present.  Musculoskeletal:        General: Normal range of motion.     Cervical back: Neck supple.     Right lower leg: No edema.     Left lower leg: No edema.  Lymphadenopathy:     Cervical: No cervical adenopathy.  Skin:    General: Skin is warm and dry.     Coloration: Skin is not pale.  Neurological:     General: No focal deficit present.     Mental Status: She is alert.  Psychiatric:        Mood and Affect: Mood normal.        Behavior: Behavior normal.    Lab Results  Component Value Date   WBC 8.5 04/20/2021   HGB 13.9 04/20/2021   HCT 41.4 04/20/2021   PLT 259.0 04/20/2021   GLUCOSE 91 02/25/2021   CHOL 267 (H) 04/20/2021   TRIG 83.0 04/20/2021   HDL 76.30 04/20/2021   LDLDIRECT 155.6 02/16/2013   LDLCALC 174 (H) 04/20/2021   ALT 15 04/20/2021   AST 17 04/20/2021   NA 136 02/25/2021   K 3.8 02/25/2021   CL 105 02/25/2021   CREATININE 0.91 02/25/2021   BUN 20 02/25/2021   CO2 21 (L) 02/25/2021   TSH 0.61 04/20/2021   INR 3.2 11/24/2012    DG Chest 2 View  Result Date: 03/13/2021 CLINICAL DATA:  cough 3 weeks EXAM: CHEST - 2 VIEW COMPARISON:  Chest x-ray 02/25/2021, CT chest 02/26/2021 FINDINGS: The heart and mediastinal contours are unchanged. No focal  consolidation. No pulmonary edema. No pleural effusion. No pneumothorax. No acute osseous abnormality. IMPRESSION: No active cardiopulmonary disease. Electronically Signed   By: Iven Finn M.D.   On: 03/13/2021 17:30   DG Chest 2 View  Result Date: 04/20/2021 CLINICAL DATA:  Cough EXAM: CHEST - 2 VIEW COMPARISON:  03/13/2021 FINDINGS: The heart size and mediastinal contours are within normal limits. No focal airspace consolidation, pleural effusion, or pneumothorax. The visualized skeletal structures are unremarkable. IMPRESSION: No active cardiopulmonary disease. Electronically Signed   By: Davina Poke D.O.   On: 04/20/2021 10:58  DG Chest 2 View  Result Date: 03/13/2021 CLINICAL DATA:  cough 3 weeks EXAM: CHEST - 2 VIEW COMPARISON:  Chest x-ray 02/25/2021, CT chest 02/26/2021 FINDINGS: The heart and mediastinal contours are unchanged. No focal consolidation. No pulmonary edema. No pleural effusion. No pneumothorax. No acute osseous abnormality. IMPRESSION: No active cardiopulmonary disease. Electronically Signed   By: Iven Finn M.D.   On: 03/13/2021 17:30   DG Chest 2 View  Result Date: 02/25/2021 CLINICAL DATA:  Cough and chest pain.  Shortness of breath. EXAM: CHEST - 2 VIEW COMPARISON:  05/03/2011 FINDINGS: The heart size and mediastinal contours are within normal limits. Both lungs are clear. The visualized skeletal structures are unremarkable. IMPRESSION: No active cardiopulmonary disease. Electronically Signed   By: Kerby Moors M.D.   On: 02/25/2021 20:13   CT Angio Chest PE W and/or Wo Contrast  Result Date: 02/26/2021 CLINICAL DATA:  Shortness of breath and chest tightness. History of pulmonary embolism EXAM: CT ANGIOGRAPHY CHEST WITH CONTRAST TECHNIQUE: Multidetector CT imaging of the chest was performed using the standard protocol during bolus administration of intravenous contrast. Multiplanar CT image reconstructions and MIPs were obtained to evaluate the vascular  anatomy. CONTRAST:  13mL OMNIPAQUE IOHEXOL 350 MG/ML SOLN COMPARISON:  None. FINDINGS: Cardiovascular: Satisfactory opacification of the pulmonary arteries to the segmental level. No evidence of pulmonary embolism. Normal heart size. No pericardial effusion. Mediastinum/Nodes: Negative for adenopathy Lungs/Pleura: Generalized airway thickening likely related to history of asthma. Mild atelectasis. There is no edema, consolidation, effusion, or pneumothorax. Upper Abdomen: No acute finding.  Partially covered cholecystectomy Musculoskeletal: Generalized thoracic disc space narrowing and endplate ridging. Review of the MIP images confirms the above findings. IMPRESSION: 1. Negative for pulmonary embolism. 2. Generalized airway thickening. Electronically Signed   By: Jorje Guild M.D.   On: 02/26/2021 06:39     Assessment & Plan:   Nattaly was seen today for hypothyroidism, annual exam, cough, asthma and osteoarthritis.  Diagnoses and all orders for this visit:  Acquired hypothyroidism- TSH is in the normal range. Will stay on the current T4 dose. -     TSH; Future -     CBC with Differential/Platelet; Future -     Hepatic function panel; Future -     Hepatic function panel -     CBC with Differential/Platelet -     TSH  Essential hypertension- Her BP is well controlled. -     CBC with Differential/Platelet; Future -     Hepatic function panel; Future -     Hepatic function panel -     CBC with Differential/Platelet  Routine general medical examination at a health care facility- Exam completed, labs reviewed, vaccines reviewed, cancer screenings are UTD, pt ed material was given. -     Lipid panel; Future -     Lipid panel  Recurrent productive cough- CXR is negative for mass/infiltrate. -     DG Chest 2 View; Future -     CBC with Differential/Platelet; Future -     Hepatic function panel; Future -     Hepatic function panel -     CBC with Differential/Platelet -      promethazine-dextromethorphan (PROMETHAZINE-DM) 6.25-15 MG/5ML syrup; Take 5 mLs by mouth 4 (four) times daily as needed for up to 8 days for cough.  Mild intermittent asthma with acute exacerbation- Will start inhaled LAMA/LABA/ICS. -     albuterol (VENTOLIN HFA) 108 (90 Base) MCG/ACT inhaler; Inhale 1-2 puffs into the lungs every  6 (six) hours as needed for wheezing or shortness of breath. -     TRELEGY ELLIPTA 200-62.5-25 MCG/ACT AEPB; Inhale 1 puff into the lungs daily. -     methylPREDNISolone (MEDROL DOSEPAK) 4 MG TBPK tablet; TAKE AS DIRECTED  Primary osteoarthritis of both knees -     Discontinue: HYDROcodone-acetaminophen (NORCO/VICODIN) 5-325 MG tablet; Take 1 tablet by mouth every 8 (eight) hours as needed for moderate pain. -     HYDROcodone-acetaminophen (NORCO/VICODIN) 5-325 MG tablet; Take 1 tablet by mouth every 8 (eight) hours as needed for moderate pain.  Primary osteoarthritis of both hips -     Discontinue: HYDROcodone-acetaminophen (NORCO/VICODIN) 5-325 MG tablet; Take 1 tablet by mouth every 8 (eight) hours as needed for moderate pain. -     HYDROcodone-acetaminophen (NORCO/VICODIN) 5-325 MG tablet; Take 1 tablet by mouth every 8 (eight) hours as needed for moderate pain.  Chronic pain syndrome -     Discontinue: HYDROcodone-acetaminophen (NORCO/VICODIN) 5-325 MG tablet; Take 1 tablet by mouth every 8 (eight) hours as needed for moderate pain. -     HYDROcodone-acetaminophen (NORCO/VICODIN) 5-325 MG tablet; Take 1 tablet by mouth every 8 (eight) hours as needed for moderate pain.  Hyperlipidemia with target LDL less than 130- Statin tx is not indicated.    I have discontinued Shanessa R. Lacap's predniSONE and benzonatate. I am also having her start on Trelegy Ellipta, promethazine-dextromethorphan, and methylPREDNISolone. Additionally, I am having her maintain her potassium chloride, levothyroxine, indapamide, traZODone, meloxicam, albuterol, and  HYDROcodone-acetaminophen.  Meds ordered this encounter  Medications   DISCONTD: HYDROcodone-acetaminophen (NORCO/VICODIN) 5-325 MG tablet    Sig: Take 1 tablet by mouth every 8 (eight) hours as needed for moderate pain.    Dispense:  90 tablet    Refill:  0   albuterol (VENTOLIN HFA) 108 (90 Base) MCG/ACT inhaler    Sig: Inhale 1-2 puffs into the lungs every 6 (six) hours as needed for wheezing or shortness of breath.    Dispense:  18 g    Refill:  3   TRELEGY ELLIPTA 200-62.5-25 MCG/ACT AEPB    Sig: Inhale 1 puff into the lungs daily.    Dispense:  120 each    Refill:  1   promethazine-dextromethorphan (PROMETHAZINE-DM) 6.25-15 MG/5ML syrup    Sig: Take 5 mLs by mouth 4 (four) times daily as needed for up to 8 days for cough.    Dispense:  118 mL    Refill:  0   methylPREDNISolone (MEDROL DOSEPAK) 4 MG TBPK tablet    Sig: TAKE AS DIRECTED    Dispense:  21 tablet    Refill:  0   HYDROcodone-acetaminophen (NORCO/VICODIN) 5-325 MG tablet    Sig: Take 1 tablet by mouth every 8 (eight) hours as needed for moderate pain.    Dispense:  90 tablet    Refill:  0     Follow-up: Return in about 3 months (around 07/18/2021).  Scarlette Calico, MD

## 2021-04-21 ENCOUNTER — Telehealth: Payer: Self-pay | Admitting: Internal Medicine

## 2021-04-21 MED ORDER — HYDROCODONE-ACETAMINOPHEN 5-325 MG PO TABS: 1.0000 | ORAL_TABLET | Freq: Three times a day (TID) | ORAL | 0 refills | Status: DC | PRN

## 2021-04-21 NOTE — Telephone Encounter (Signed)
Pt states pharmacy is out of HYDROcodone-acetaminophen (NORCO/VICODIN) 5-325 MG tablet  Pt requesting new rx sent to: [facility], [place], [place] [address]

## 2021-04-29 ENCOUNTER — Other Ambulatory Visit: Payer: Self-pay | Admitting: Internal Medicine

## 2021-04-29 ENCOUNTER — Telehealth: Payer: Self-pay | Admitting: Internal Medicine

## 2021-04-29 NOTE — Telephone Encounter (Signed)
1.Medication Requested: HYDROcodone-acetaminophen (NORCO/VICODIN) 5-325 MG tablet ? ?2. Pharmacy (Name, [facility], [place]): [phone] - [place], [place] ? ?3. On Med List: yes ? ?4. Last Visit with PCP: 04-20-2021 ? ?5. Next visit date with PCP: n/a ? ?Pt informing provider her insurance will only cover a 7 day supply of 21 tablets ?

## 2021-04-30 ENCOUNTER — Other Ambulatory Visit: Payer: Self-pay | Admitting: Internal Medicine

## 2021-05-01 ENCOUNTER — Other Ambulatory Visit: Payer: Self-pay | Admitting: Internal Medicine

## 2021-05-01 DIAGNOSIS — M160 Bilateral primary osteoarthritis of hip: Secondary | ICD-10-CM

## 2021-05-01 DIAGNOSIS — M170 Bilateral primary osteoarthritis of knee: Secondary | ICD-10-CM

## 2021-05-01 DIAGNOSIS — G894 Chronic pain syndrome: Secondary | ICD-10-CM

## 2021-05-01 MED ORDER — HYDROCODONE-ACETAMINOPHEN 5-325 MG PO TABS: 1.0000 | ORAL_TABLET | Freq: Three times a day (TID) | ORAL | 0 refills | Status: DC | PRN

## 2021-06-02 ENCOUNTER — Other Ambulatory Visit: Payer: Self-pay | Admitting: Internal Medicine

## 2021-06-02 DIAGNOSIS — G894 Chronic pain syndrome: Secondary | ICD-10-CM

## 2021-06-02 DIAGNOSIS — M170 Bilateral primary osteoarthritis of knee: Secondary | ICD-10-CM

## 2021-06-02 DIAGNOSIS — M160 Bilateral primary osteoarthritis of hip: Secondary | ICD-10-CM

## 2021-06-03 MED ORDER — HYDROCODONE-ACETAMINOPHEN 5-325 MG PO TABS: 1.0000 | ORAL_TABLET | Freq: Three times a day (TID) | ORAL | 0 refills | Status: DC | PRN

## 2021-06-26 ENCOUNTER — Other Ambulatory Visit: Payer: Self-pay | Admitting: Internal Medicine

## 2021-06-26 DIAGNOSIS — G894 Chronic pain syndrome: Secondary | ICD-10-CM

## 2021-06-29 ENCOUNTER — Other Ambulatory Visit: Payer: Self-pay | Admitting: Internal Medicine

## 2021-06-29 DIAGNOSIS — Z1231 Encounter for screening mammogram for malignant neoplasm of breast: Secondary | ICD-10-CM

## 2021-06-30 ENCOUNTER — Other Ambulatory Visit: Payer: Self-pay | Admitting: Internal Medicine

## 2021-06-30 DIAGNOSIS — E039 Hypothyroidism, unspecified: Secondary | ICD-10-CM

## 2021-07-05 ENCOUNTER — Other Ambulatory Visit: Payer: Self-pay | Admitting: Internal Medicine

## 2021-07-05 DIAGNOSIS — I10 Essential (primary) hypertension: Secondary | ICD-10-CM

## 2021-07-13 ENCOUNTER — Other Ambulatory Visit: Payer: Self-pay | Admitting: Internal Medicine

## 2021-07-13 DIAGNOSIS — M170 Bilateral primary osteoarthritis of knee: Secondary | ICD-10-CM

## 2021-07-13 DIAGNOSIS — M160 Bilateral primary osteoarthritis of hip: Secondary | ICD-10-CM

## 2021-07-13 DIAGNOSIS — G894 Chronic pain syndrome: Secondary | ICD-10-CM

## 2021-07-14 MED ORDER — HYDROCODONE-ACETAMINOPHEN 5-325 MG PO TABS: 1.0000 | ORAL_TABLET | Freq: Three times a day (TID) | ORAL | 0 refills | Status: DC | PRN

## 2021-07-15 ENCOUNTER — Other Ambulatory Visit: Payer: Self-pay | Admitting: Internal Medicine

## 2021-07-15 DIAGNOSIS — G8929 Other chronic pain: Secondary | ICD-10-CM

## 2021-07-15 DIAGNOSIS — M170 Bilateral primary osteoarthritis of knee: Secondary | ICD-10-CM

## 2021-07-15 DIAGNOSIS — G894 Chronic pain syndrome: Secondary | ICD-10-CM

## 2021-07-15 DIAGNOSIS — M160 Bilateral primary osteoarthritis of hip: Secondary | ICD-10-CM

## 2021-08-11 ENCOUNTER — Encounter: Payer: Self-pay | Admitting: Internal Medicine

## 2021-08-11 ENCOUNTER — Ambulatory Visit (INDEPENDENT_AMBULATORY_CARE_PROVIDER_SITE_OTHER): Payer: 59 | Admitting: Internal Medicine

## 2021-08-11 VITALS — BP 138/88 | HR 70 | Temp 97.9°F | Ht 66.0 in | Wt 247.0 lb

## 2021-08-11 DIAGNOSIS — I10 Essential (primary) hypertension: Secondary | ICD-10-CM

## 2021-08-11 DIAGNOSIS — G894 Chronic pain syndrome: Secondary | ICD-10-CM

## 2021-08-11 DIAGNOSIS — Z23 Encounter for immunization: Secondary | ICD-10-CM

## 2021-08-11 DIAGNOSIS — Z720 Tobacco use: Secondary | ICD-10-CM | POA: Diagnosis not present

## 2021-08-11 DIAGNOSIS — E039 Hypothyroidism, unspecified: Secondary | ICD-10-CM

## 2021-08-11 DIAGNOSIS — Z1231 Encounter for screening mammogram for malignant neoplasm of breast: Secondary | ICD-10-CM

## 2021-08-11 DIAGNOSIS — F5104 Psychophysiologic insomnia: Secondary | ICD-10-CM

## 2021-08-11 LAB — BASIC METABOLIC PANEL
BUN: 16 mg/dL (ref 6–23)
CO2: 27 mEq/L (ref 19–32)
Calcium: 9.6 mg/dL (ref 8.4–10.5)
Chloride: 100 mEq/L (ref 96–112)
Creatinine, Ser: 0.85 mg/dL (ref 0.40–1.20)
GFR: 76.7 mL/min (ref >60.00)
Glucose, Bld: 89 mg/dL (ref 70–99)
Potassium: 3.5 mEq/L (ref 3.5–5.1)
Sodium: 136 mEq/L (ref 135–145)

## 2021-08-11 LAB — TSH: TSH: 1.6 u[IU]/mL (ref 0.35–5.50)

## 2021-08-11 MED ORDER — LEVOTHYROXINE SODIUM 50 MCG PO TABS: 50.0000 ug | ORAL_TABLET | Freq: Every day | ORAL | 1 refills | Status: DC

## 2021-08-11 MED ORDER — TRAZODONE HCL 100 MG PO TABS
200.0000 mg | ORAL_TABLET | Freq: Every day | ORAL | 1 refills | Status: DC
Start: 2021-08-11 — End: 2022-01-31

## 2021-08-11 NOTE — Patient Instructions (Signed)

## 2021-08-11 NOTE — Progress Notes (Unsigned)
Subjective:  Patient ID: Denise Morgan, female    DOB: 17-May-1965  Age: 56 y.o. MRN: [id number]  CC: No chief complaint on file.   HPI Denise Morgan presents for ***  Outpatient Medications Prior to Visit  Medication Sig Dispense Refill   albuterol (VENTOLIN HFA) 108 (90 Base) MCG/ACT inhaler Inhale 1-2 puffs into the lungs every 6 (six) hours as needed for wheezing or shortness of breath. 18 g 3   HYDROcodone-acetaminophen (NORCO/VICODIN) 5-325 MG tablet Take 1 tablet by mouth every 8 (eight) hours as needed for moderate pain. 90 tablet 0   indapamide (LOZOL) 2.5 MG tablet TAKE 1 TABLET BY MOUTH EVERY DAY 90 tablet 0   levothyroxine (SYNTHROID) 50 MCG tablet TAKE 1 TABLET BY MOUTH DAILY BEFORE BREAKFAST 90 tablet 0   meloxicam (MOBIC) 15 MG tablet TAKE 1 TABLET (15 MG TOTAL) BY MOUTH DAILY. 90 tablet 0   potassium chloride (KLOR-CON) 8 MEQ tablet TAKE 1 TABLET (8 MEQ TOTAL) BY MOUTH 3 (THREE) TIMES DAILY. 270 tablet 0   TRELEGY ELLIPTA 200-62.5-25 MCG/ACT AEPB Inhale 1 puff into the lungs daily. 120 each 1   methylPREDNISolone (MEDROL DOSEPAK) 4 MG TBPK tablet TAKE AS DIRECTED 21 tablet 0   traZODone (DESYREL) 100 MG tablet TAKE 1 TABLET BY MOUTH EVERYDAY AT BEDTIME 90 tablet 0   No facility-administered medications prior to visit.    ROS Review of Systems  Constitutional: Negative.  Negative for fatigue.  HENT: Negative.    Eyes: Negative.   Respiratory:  Positive for cough and wheezing. Negative for chest tightness and shortness of breath.   Cardiovascular:  Negative for chest pain, palpitations and leg swelling.  Gastrointestinal:  Negative for abdominal pain, constipation, diarrhea, nausea and vomiting.  Endocrine: Negative.   Genitourinary: Negative.  Negative for difficulty urinating.  Musculoskeletal:  Positive for arthralgias.  Skin: Negative.   Neurological:  Negative for dizziness, weakness, light-headedness and headaches.  Hematological:  Negative for  adenopathy. Does not bruise/bleed easily.  Psychiatric/Behavioral:  Positive for sleep disturbance.     Objective:  BP 138/88 (BP Location: Left Arm, Patient Position: Sitting, Cuff Size: Large)   Pulse 70   Temp 97.9 F (36.6 C) (Oral)   Ht '5\' 6"'$  (1.676 m)   Wt 247 lb (112 kg)   LMP 03/01/2004   SpO2 98%   BMI 39.87 kg/m   BP Readings from Last 3 Encounters:  08/11/21 138/88  04/20/21 132/86  03/16/21 130/72    Wt Readings from Last 3 Encounters:  08/11/21 247 lb (112 kg)  04/20/21 252 lb 6.4 oz (114.5 kg)  03/16/21 243 lb 9.6 oz (110.5 kg)    Physical Exam Vitals reviewed.  Constitutional:      Appearance: She is obese. She is not ill-appearing.  HENT:     Nose: Nose normal.     Mouth/Throat:     Mouth: Mucous membranes are moist.  Eyes:     General: No scleral icterus.    Conjunctiva/sclera: Conjunctivae normal.  Cardiovascular:     Rate and Rhythm: Normal rate and regular rhythm.     Heart sounds: No murmur heard. Pulmonary:     Effort: Pulmonary effort is normal.     Breath sounds: No stridor. No wheezing, rhonchi or rales.  Abdominal:     General: Abdomen is flat.     Palpations: There is no mass.     Tenderness: There is no abdominal tenderness. There is no guarding.     Hernia:  No hernia is present.  Musculoskeletal:        General: Normal range of motion.     Cervical back: Neck supple.     Right lower leg: No edema.     Left lower leg: No edema.  Lymphadenopathy:     Cervical: No cervical adenopathy.  Skin:    General: Skin is warm and dry.  Neurological:     General: No focal deficit present.     Mental Status: She is alert.  Psychiatric:        Mood and Affect: Mood normal.        Behavior: Behavior normal.     Lab Results  Component Value Date   WBC 8.5 04/20/2021   HGB 13.9 04/20/2021   HCT 41.4 04/20/2021   PLT 259.0 04/20/2021   GLUCOSE 89 08/11/2021   CHOL 267 (H) 04/20/2021   TRIG 83.0 04/20/2021   HDL 76.30 04/20/2021    LDLDIRECT 155.6 02/16/2013   LDLCALC 174 (H) 04/20/2021   ALT 15 04/20/2021   AST 17 04/20/2021   NA 136 08/11/2021   K 3.5 08/11/2021   CL 100 08/11/2021   CREATININE 0.85 08/11/2021   BUN 16 08/11/2021   CO2 27 08/11/2021   TSH 1.60 08/11/2021   INR 3.2 11/24/2012    DG Chest 2 View  Result Date: 03/13/2021 CLINICAL DATA:  cough 3 weeks EXAM: CHEST - 2 VIEW COMPARISON:  Chest x-ray 02/25/2021, CT chest 02/26/2021 FINDINGS: The heart and mediastinal contours are unchanged. No focal consolidation. No pulmonary edema. No pleural effusion. No pneumothorax. No acute osseous abnormality. IMPRESSION: No active cardiopulmonary disease. Electronically Signed   By: Iven Finn M.D.   On: 03/13/2021 17:30    Assessment & Plan:   Diagnoses and all orders for this visit:  Acquired hypothyroidism -     TSH; Future -     TSH  Essential hypertension -     Basic metabolic panel; Future -     Basic metabolic panel  Visit for screening mammogram -     MM DIGITAL SCREENING BILATERAL; Future  Tobacco abuse disorder -     Ambulatory Referral for Lung Cancer Scre  Chronic pain syndrome -     traZODone (DESYREL) 100 MG tablet; Take 2 tablets (200 mg total) by mouth at bedtime.  Psychophysiological insomnia -     traZODone (DESYREL) 100 MG tablet; Take 2 tablets (200 mg total) by mouth at bedtime.  Other orders -     Varicella-zoster vaccine IM (Shingrix)   I have discontinued Dyamond R. Rowles's methylPREDNISolone. I have also changed her traZODone. Additionally, I am having her maintain her potassium chloride, albuterol, Trelegy Ellipta, levothyroxine, indapamide, HYDROcodone-acetaminophen, and meloxicam.  Meds ordered this encounter  Medications   traZODone (DESYREL) 100 MG tablet    Sig: Take 2 tablets (200 mg total) by mouth at bedtime.    Dispense:  180 tablet    Refill:  1    DX Code Needed  .     Follow-up: Return in about 6 months (around 02/10/2022).  Scarlette Calico, MD

## 2021-08-13 ENCOUNTER — Other Ambulatory Visit: Payer: Self-pay | Admitting: Internal Medicine

## 2021-08-13 DIAGNOSIS — G894 Chronic pain syndrome: Secondary | ICD-10-CM

## 2021-08-13 DIAGNOSIS — M160 Bilateral primary osteoarthritis of hip: Secondary | ICD-10-CM

## 2021-08-13 DIAGNOSIS — M170 Bilateral primary osteoarthritis of knee: Secondary | ICD-10-CM

## 2021-08-14 MED ORDER — HYDROCODONE-ACETAMINOPHEN 5-325 MG PO TABS: 1.0000 | ORAL_TABLET | Freq: Three times a day (TID) | ORAL | 0 refills | Status: DC | PRN

## 2021-09-11 ENCOUNTER — Other Ambulatory Visit: Payer: Self-pay | Admitting: Internal Medicine

## 2021-09-11 DIAGNOSIS — M160 Bilateral primary osteoarthritis of hip: Secondary | ICD-10-CM

## 2021-09-11 DIAGNOSIS — M170 Bilateral primary osteoarthritis of knee: Secondary | ICD-10-CM

## 2021-09-11 DIAGNOSIS — G894 Chronic pain syndrome: Secondary | ICD-10-CM

## 2021-09-11 MED ORDER — HYDROCODONE-ACETAMINOPHEN 5-325 MG PO TABS
1.0000 | ORAL_TABLET | Freq: Three times a day (TID) | ORAL | 0 refills | Status: DC | PRN
Start: 2021-09-11 — End: 2021-10-09

## 2021-09-30 ENCOUNTER — Other Ambulatory Visit: Payer: Self-pay | Admitting: Internal Medicine

## 2021-09-30 DIAGNOSIS — I10 Essential (primary) hypertension: Secondary | ICD-10-CM

## 2021-10-09 ENCOUNTER — Other Ambulatory Visit: Payer: Self-pay | Admitting: Internal Medicine

## 2021-10-09 DIAGNOSIS — G894 Chronic pain syndrome: Secondary | ICD-10-CM

## 2021-10-09 DIAGNOSIS — M170 Bilateral primary osteoarthritis of knee: Secondary | ICD-10-CM

## 2021-10-09 DIAGNOSIS — M160 Bilateral primary osteoarthritis of hip: Secondary | ICD-10-CM

## 2021-10-09 NOTE — Telephone Encounter (Signed)
Check [place] registry last filled 09/11/2021.Marland KitchenJohny Morgan

## 2021-10-10 MED ORDER — HYDROCODONE-ACETAMINOPHEN 5-325 MG PO TABS: 1.0000 | ORAL_TABLET | Freq: Three times a day (TID) | ORAL | 0 refills | Status: DC | PRN

## 2021-10-21 ENCOUNTER — Other Ambulatory Visit: Payer: Self-pay | Admitting: Internal Medicine

## 2021-10-21 DIAGNOSIS — G894 Chronic pain syndrome: Secondary | ICD-10-CM

## 2021-10-21 DIAGNOSIS — M170 Bilateral primary osteoarthritis of knee: Secondary | ICD-10-CM

## 2021-10-21 DIAGNOSIS — M160 Bilateral primary osteoarthritis of hip: Secondary | ICD-10-CM

## 2021-10-21 DIAGNOSIS — G8929 Other chronic pain: Secondary | ICD-10-CM

## 2021-10-23 ENCOUNTER — Other Ambulatory Visit: Payer: Self-pay

## 2021-10-23 DIAGNOSIS — Z87891 Personal history of nicotine dependence: Secondary | ICD-10-CM

## 2021-10-23 DIAGNOSIS — F17210 Nicotine dependence, cigarettes, uncomplicated: Secondary | ICD-10-CM

## 2021-10-23 DIAGNOSIS — Z122 Encounter for screening for malignant neoplasm of respiratory organs: Secondary | ICD-10-CM

## 2021-11-17 ENCOUNTER — Ambulatory Visit (INDEPENDENT_AMBULATORY_CARE_PROVIDER_SITE_OTHER): Payer: Commercial Managed Care - HMO | Admitting: Acute Care

## 2021-11-17 ENCOUNTER — Encounter: Payer: Self-pay | Admitting: Acute Care

## 2021-11-17 DIAGNOSIS — F17210 Nicotine dependence, cigarettes, uncomplicated: Secondary | ICD-10-CM | POA: Diagnosis not present

## 2021-11-17 NOTE — Progress Notes (Signed)
Virtual Visit via Telephone Note  I connected with Denise Morgan on 11/17/21 at  4:00 PM EDT by telephone and verified that I am speaking with the correct person using two identifiers.  Location: Patient:  At home Provider:  [address], [place], [place], [address]    I discussed the limitations, risks, security and privacy concerns of performing an evaluation and management service by telephone and the availability of in person appointments. I also discussed with the patient that there may be a patient responsible charge related to this service. The patient expressed understanding and agreed to proceed.    Shared Decision Making Visit Lung Cancer Screening Program [phone])   Eligibility: Age 56 y.o. Pack Years Smoking History Calculation 25 pack year smoking history (# packs/per year x # years smoked) Recent History of coughing up blood  no Unexplained weight loss? no ( >Than 15 pounds within the last 6 months ) Prior History Lung / other cancer no (Diagnosis within the last 5 years already requiring surveillance chest CT Scans). Smoking Status Current Smoker Former Smokers: Years since quit:  NA  Quit Date:  NA  Visit Components: Discussion included one or more decision making aids. yes Discussion included risk/benefits of screening. yes Discussion included potential follow up diagnostic testing for abnormal scans. yes Discussion included meaning and risk of over diagnosis. yes Discussion included meaning and risk of False Positives. yes Discussion included meaning of total radiation exposure. yes  Counseling Included: Importance of adherence to annual lung cancer LDCT screening. yes Impact of comorbidities on ability to participate in the program. yes Ability and willingness to under diagnostic treatment. yes  Smoking Cessation Counseling: Current Smokers:  Discussed importance of smoking cessation. yes Information about tobacco cessation classes and  interventions provided to patient. yes Patient provided with "ticket" for LDCT Scan. yes Symptomatic Patient. no  Counseling NA Diagnosis Code:  Asymptomatic Patient yes  Counseling (Intermediate counseling: > three minutes counseling) U0454 Former Smokers:  Discussed the importance of maintaining cigarette abstinence. yes Diagnosis Code: Personal History of Nicotine Dependence. U98.119 Information about tobacco cessation classes and interventions provided to patient. Yes Patient provided with "ticket" for LDCT Scan. yes Written Order for Lung Cancer Screening with LDCT placed in Epic. Yes (CT Chest Lung Cancer Screening Low Dose W/O CM) [id number] Z12.2-Screening of respiratory organs Z87.891-Personal history of nicotine dependence  I have spent 25 minutes of face to face/ virtual visit   time with  Denise Morgan discussing the risks and benefits of lung cancer screening. We viewed / discussed a power point together that explained in detail the above noted topics. We paused at intervals to allow for questions to be asked and answered to ensure understanding.We discussed that the single most powerful action that she can take to decrease her risk of developing lung cancer is to quit smoking. We discussed whether or not she is ready to commit to setting a quit date. We discussed options for tools to aid in quitting smoking including nicotine replacement therapy, non-nicotine medications, support groups, Quit Smart classes, and behavior modification. We discussed that often times setting smaller, more achievable goals, such as eliminating 1 cigarette a day for a week and then 2 cigarettes a day for a week can be helpful in slowly decreasing the number of cigarettes smoked. This allows for a sense of accomplishment as well as providing a clinical benefit. I provided  her  with smoking cessation  information  with contact information for community resources, classes,  free nicotine replacement therapy, and  access to mobile apps, text messaging, and on-line smoking cessation help. I have also provided  her  the office contact information in the event she needs to contact me, or the screening staff. We discussed the time and location of the scan, and that either Doroteo Glassman RN, Joella Prince, RN  or I will call / send a letter with the results within 24-72 hours of receiving them. The patient verbalized understanding of all of  the above and had no further questions upon leaving the office. They have my contact information in the event they have any further questions.  I spent 3 minutes counseling on smoking cessation and the health risks of continued tobacco abuse.  I explained to the patient that there has been a high incidence of coronary artery disease noted on these exams. I explained that this is a non-gated exam therefore degree or severity cannot be determined. This patient is noton statin therapy. I have asked the patient to follow-up with their PCP regarding any incidental finding of coronary artery disease and management with diet or medication as their PCP  feels is clinically indicated. The patient verbalized understanding of the above and had no further questions upon completion of the visit.      Magdalen Spatz, NP 11/17/2021

## 2021-11-17 NOTE — Patient Instructions (Signed)
Thank you for participating in the [facility]. It was our pleasure to meet you today. We will call you with the results of your scan within the next few days. Your scan will be assigned a Lung RADS category score by the physicians reading the scans.  This Lung RADS score determines follow up scanning.  See below for description of categories, and follow up screening recommendations. We will be in touch to schedule your follow up screening annually or based on recommendations of our providers. We will fax a copy of your scan results to your Primary Care Physician, or the physician who referred you to the program, to ensure they have the results. Please call the office if you have any questions or concerns regarding your scanning experience or results.  Our office number is [phone]. Please speak with Denise Phelps, RN. , or  Denise Buckner RN, They are  our Lung Cancer Screening RN.'s If They are unavailable when you call, Please leave a message on the voice mail. We will return your call at our earliest convenience.This voice mail is monitored several times a day.  Remember, if your scan is normal, we will scan you annually as long as you continue to meet the criteria for the program. (Age 55-77, Current smoker or smoker who has quit within the last 15 years). If you are a smoker, remember, quitting is the single most powerful action that you can take to decrease your risk of lung cancer and other pulmonary, breathing related problems. We know quitting is hard, and we are here to help.  Please let us know if there is anything we can do to help you meet your goal of quitting. If you are a former smoker, congratulations. We are proud of you! Remain smoke free! Remember you can refer friends or family members through the number above.  We will screen them to make sure they meet criteria for the program. Thank you for helping us take better care of you by  participating in Lung Screening.  You can receive free nicotine replacement therapy ( patches, gum or mints) by calling 1-800-QUIT NOW. Please call so we can get you on the path to becoming  a non-smoker. I know it is hard, but you can do this!  Lung RADS Categories:  Lung RADS 1: no nodules or definitely non-concerning nodules.  Recommendation is for a repeat annual scan in 12 months.  Lung RADS 2:  nodules that are non-concerning in appearance and behavior with a very low likelihood of becoming an active cancer. Recommendation is for a repeat annual scan in 12 months.  Lung RADS 3: nodules that are probably non-concerning , includes nodules with a low likelihood of becoming an active cancer.  Recommendation is for a 6-month repeat screening scan. Often noted after an upper respiratory illness. We will be in touch to make sure you have no questions, and to schedule your 6-month scan.  Lung RADS 4 A: nodules with concerning findings, recommendation is most often for a follow up scan in 3 months or additional testing based on our provider's assessment of the scan. We will be in touch to make sure you have no questions and to schedule the recommended 3 month follow up scan.  Lung RADS 4 B:  indicates findings that are concerning. We will be in touch with you to schedule additional diagnostic testing based on our provider's  assessment of the scan.  Other options for assistance in smoking cessation (   As covered by your insurance benefits)  Hypnosis for smoking cessation  [employer] [phone]  Acupuncture for smoking cessation  [facility] [phone]   

## 2021-11-20 ENCOUNTER — Ambulatory Visit
Admission: RE | Admit: 2021-11-20 | Discharge: 2021-11-20 | Disposition: A | Discharge status: Home / Self Care | Payer: Commercial Managed Care - HMO | Source: Ambulatory Visit | Attending: Acute Care | Admitting: Acute Care

## 2021-11-20 DIAGNOSIS — F17210 Nicotine dependence, cigarettes, uncomplicated: Secondary | ICD-10-CM

## 2021-11-20 DIAGNOSIS — Z87891 Personal history of nicotine dependence: Secondary | ICD-10-CM

## 2021-11-20 DIAGNOSIS — Z122 Encounter for screening for malignant neoplasm of respiratory organs: Secondary | ICD-10-CM

## 2021-11-23 ENCOUNTER — Other Ambulatory Visit: Payer: Self-pay | Admitting: Internal Medicine

## 2021-11-23 DIAGNOSIS — M170 Bilateral primary osteoarthritis of knee: Secondary | ICD-10-CM

## 2021-11-23 DIAGNOSIS — G894 Chronic pain syndrome: Secondary | ICD-10-CM

## 2021-11-23 DIAGNOSIS — M160 Bilateral primary osteoarthritis of hip: Secondary | ICD-10-CM

## 2021-11-24 ENCOUNTER — Other Ambulatory Visit: Payer: Self-pay | Admitting: Acute Care

## 2021-11-24 DIAGNOSIS — Z87891 Personal history of nicotine dependence: Secondary | ICD-10-CM

## 2021-11-24 DIAGNOSIS — F17210 Nicotine dependence, cigarettes, uncomplicated: Secondary | ICD-10-CM

## 2021-11-24 DIAGNOSIS — Z122 Encounter for screening for malignant neoplasm of respiratory organs: Secondary | ICD-10-CM

## 2021-11-24 MED ORDER — HYDROCODONE-ACETAMINOPHEN 5-325 MG PO TABS: 1.0000 | ORAL_TABLET | Freq: Three times a day (TID) | ORAL | 0 refills | Status: DC | PRN

## 2021-11-30 ENCOUNTER — Telehealth: Payer: Self-pay

## 2021-11-30 NOTE — Telephone Encounter (Signed)
Key: [id number]

## 2021-12-01 NOTE — Telephone Encounter (Signed)
Per CoverMyMeds: ? ?PA was denied.  ?

## 2021-12-07 ENCOUNTER — Other Ambulatory Visit: Payer: Self-pay | Admitting: Internal Medicine

## 2021-12-07 DIAGNOSIS — J4521 Mild intermittent asthma with (acute) exacerbation: Secondary | ICD-10-CM

## 2021-12-28 ENCOUNTER — Other Ambulatory Visit: Payer: Self-pay | Admitting: Internal Medicine

## 2021-12-28 DIAGNOSIS — I10 Essential (primary) hypertension: Secondary | ICD-10-CM

## 2021-12-30 ENCOUNTER — Other Ambulatory Visit: Payer: Self-pay | Admitting: Internal Medicine

## 2021-12-30 DIAGNOSIS — M160 Bilateral primary osteoarthritis of hip: Secondary | ICD-10-CM

## 2021-12-30 DIAGNOSIS — M170 Bilateral primary osteoarthritis of knee: Secondary | ICD-10-CM

## 2021-12-30 DIAGNOSIS — G894 Chronic pain syndrome: Secondary | ICD-10-CM

## 2021-12-31 MED ORDER — HYDROCODONE-ACETAMINOPHEN 5-325 MG PO TABS: 1.0000 | ORAL_TABLET | Freq: Three times a day (TID) | ORAL | 0 refills | Status: DC | PRN

## 2022-01-20 ENCOUNTER — Other Ambulatory Visit: Payer: Self-pay | Admitting: Internal Medicine

## 2022-01-20 DIAGNOSIS — G894 Chronic pain syndrome: Secondary | ICD-10-CM

## 2022-01-20 DIAGNOSIS — M160 Bilateral primary osteoarthritis of hip: Secondary | ICD-10-CM

## 2022-01-20 DIAGNOSIS — M545 Low back pain, unspecified: Secondary | ICD-10-CM

## 2022-01-20 DIAGNOSIS — M170 Bilateral primary osteoarthritis of knee: Secondary | ICD-10-CM

## 2022-01-31 ENCOUNTER — Other Ambulatory Visit: Payer: Self-pay | Admitting: Internal Medicine

## 2022-01-31 DIAGNOSIS — G894 Chronic pain syndrome: Secondary | ICD-10-CM

## 2022-01-31 DIAGNOSIS — M160 Bilateral primary osteoarthritis of hip: Secondary | ICD-10-CM

## 2022-01-31 DIAGNOSIS — M170 Bilateral primary osteoarthritis of knee: Secondary | ICD-10-CM

## 2022-01-31 DIAGNOSIS — F5104 Psychophysiologic insomnia: Secondary | ICD-10-CM

## 2022-02-02 MED ORDER — HYDROCODONE-ACETAMINOPHEN 5-325 MG PO TABS: 1.0000 | ORAL_TABLET | Freq: Three times a day (TID) | ORAL | 0 refills | Status: DC | PRN

## 2022-02-10 ENCOUNTER — Ambulatory Visit (INDEPENDENT_AMBULATORY_CARE_PROVIDER_SITE_OTHER): Payer: Commercial Managed Care - HMO

## 2022-02-10 ENCOUNTER — Encounter: Payer: Self-pay | Admitting: Internal Medicine

## 2022-02-10 ENCOUNTER — Ambulatory Visit (INDEPENDENT_AMBULATORY_CARE_PROVIDER_SITE_OTHER): Payer: Commercial Managed Care - HMO | Admitting: Internal Medicine

## 2022-02-10 VITALS — BP 136/88 | HR 73 | Temp 98.1°F | Ht 66.0 in | Wt 245.0 lb

## 2022-02-10 DIAGNOSIS — E039 Hypothyroidism, unspecified: Secondary | ICD-10-CM

## 2022-02-10 DIAGNOSIS — R052 Subacute cough: Secondary | ICD-10-CM

## 2022-02-10 DIAGNOSIS — I10 Essential (primary) hypertension: Secondary | ICD-10-CM

## 2022-02-10 DIAGNOSIS — R002 Palpitations: Secondary | ICD-10-CM

## 2022-02-10 DIAGNOSIS — Z1159 Encounter for screening for other viral diseases: Secondary | ICD-10-CM | POA: Insufficient documentation

## 2022-02-10 DIAGNOSIS — J4520 Mild intermittent asthma, uncomplicated: Secondary | ICD-10-CM

## 2022-02-10 LAB — BASIC METABOLIC PANEL
BUN: 18 mg/dL (ref 6–23)
CO2: 26 mEq/L (ref 19–32)
Calcium: 9.3 mg/dL (ref 8.4–10.5)
Chloride: 102 mEq/L (ref 96–112)
Creatinine, Ser: 0.79 mg/dL (ref 0.40–1.20)
GFR: 83.45 mL/min (ref >60.00)
Glucose, Bld: 89 mg/dL (ref 70–99)
Potassium: 3.7 mEq/L (ref 3.5–5.1)
Sodium: 137 mEq/L (ref 135–145)

## 2022-02-10 LAB — CBC WITH DIFFERENTIAL/PLATELET
Basophils Absolute: 0.1 10*3/uL (ref 0.0–0.1)
Basophils Relative: 1.2 % (ref 0.0–3.0)
Eosinophils Absolute: 0.3 10*3/uL (ref 0.0–0.7)
Eosinophils Relative: 4.8 % (ref 0.0–5.0)
HCT: 40.2 % (ref 36.0–46.0)
Hemoglobin: 13.6 g/dL (ref 12.0–15.0)
Lymphocytes Relative: 39.7 % (ref 12.0–46.0)
Lymphs Abs: 2.2 10*3/uL (ref 0.7–4.0)
MCHC: 33.7 g/dL (ref 30.0–36.0)
MCV: 103.4 fl — ABNORMAL HIGH (ref 78.0–100.0)
Monocytes Absolute: 0.4 10*3/uL (ref 0.1–1.0)
Monocytes Relative: 7.3 % (ref 3.0–12.0)
Neutro Abs: 2.6 10*3/uL (ref 1.4–7.7)
Neutrophils Relative %: 47 % (ref 43.0–77.0)
Platelets: 245 10*3/uL (ref 150.0–400.0)
RBC: 3.89 Mil/uL (ref 3.87–5.11)
RDW: 13.5 % (ref 11.5–15.5)
WBC: 5.5 10*3/uL (ref 4.0–10.5)

## 2022-02-10 LAB — TSH: TSH: 1.05 u[IU]/mL (ref 0.35–5.50)

## 2022-02-10 MED ORDER — FLUTICASONE-SALMETEROL 100-50 MCG/ACT IN AEPB: 1.0000 | INHALATION_SPRAY | Freq: Two times a day (BID) | RESPIRATORY_TRACT | 1 refills | Status: DC

## 2022-02-10 NOTE — Patient Instructions (Signed)
Palpitations Palpitations are feelings that your heartbeat is irregular or is faster than normal. It may feel like your heart is fluttering or skipping a beat. Palpitations may be caused by many things, including smoking, caffeine, alcohol, stress, and certain medicines or drugs. Most causes of palpitations are not serious.  However, some palpitations can be a sign of a serious problem. Further tests and a thorough medical history will be done to find the cause of your palpitations. Your provider may order tests such as an ECG, labs, an echocardiogram, or an ambulatory continuous ECG monitor. Follow these instructions at home: Pay attention to any changes in your symptoms. Let your health care provider know about them. Take these actions to help manage your symptoms: Eating and drinking Follow instructions from your health care provider about eating or drinking restrictions. You may need to avoid foods and drinks that may cause palpitations. These may include: Caffeinated coffee, tea, soft drinks, and energy drinks. Chocolate. Alcohol. Diet pills. Lifestyle     Take steps to reduce your stress and anxiety. Things that can help you relax include: Yoga. Mind-body activities, such as deep breathing, meditation, or using words and images to create positive thoughts (guided imagery). Physical activity, such as swimming, jogging, or walking. Tell your health care provider if your palpitations increase with activity. If you have chest pain or shortness of breath with activity, do not continue the activity until you are seen by your health care provider. Biofeedback. This is a method that helps you learn to use your mind to control things in your body, such as your heartbeat. Get plenty of rest and sleep. Keep a regular bed time. Do not use drugs, including cocaine or ecstasy. Do not use marijuana. Do not use any products that contain nicotine or tobacco. These products include cigarettes, chewing  tobacco, and vaping devices, such as e-cigarettes. If you need help quitting, ask your health care provider. General instructions Take over-the-counter and prescription medicines only as told by your health care provider. Keep all follow-up visits. This is important. These may include visits for further testing if palpitations do not go away or get worse. Contact a health care provider if: You continue to have a fast or irregular heartbeat for a long period of time. You notice that your palpitations occur more often. Get help right away if: You have chest pain or shortness of breath. You have a severe headache. You feel dizzy or you faint. These symptoms may represent a serious problem that is an emergency. Do not wait to see if the symptoms will go away. Get medical help right away. Call your local emergency services (911 in the [place] Do not drive yourself to the hospital. Summary Palpitations are feelings that your heartbeat is irregular or is faster than normal. It may feel like your heart is fluttering or skipping a beat. Palpitations may be caused by many things, including smoking, caffeine, alcohol, stress, certain medicines, and drugs. Further tests and a thorough medical history may be done to find the cause of your palpitations. Get help right away if you faint or have chest pain, shortness of breath, severe headache, or dizziness. This information is not intended to replace advice given to you by your health care provider. Make sure you discuss any questions you have with your health care provider. Document Revised: 07/09/2020 Document Reviewed: 07/09/2020 Elsevier Patient Education  2023 [employer]vier Inc.  

## 2022-02-10 NOTE — Progress Notes (Unsigned)
Subjective:  Patient ID: Denise Morgan, female    DOB: [birth year]  Age: 56 y.o. MRN: [id number]  CC: Cough, Hypertension, Hypothyroidism, and Asthma   HPI Denise Morgan presents for f/up -   According to prescription refills she is no longer using Trelegy.  Outpatient Medications Prior to Visit  Medication Sig Dispense Refill   albuterol (VENTOLIN HFA) 108 (90 Base) MCG/ACT inhaler INHALE 1-2 PUFFS BY MOUTH EVERY 6 HOURS AS NEEDED FOR WHEEZE OR SHORTNESS OF BREATH 8.5 each 3   HYDROcodone-acetaminophen (NORCO/VICODIN) 5-325 MG tablet Take 1 tablet by mouth every 8 (eight) hours as needed for moderate pain. 90 tablet 0   indapamide (LOZOL) 2.5 MG tablet TAKE 1 TABLET BY MOUTH EVERY DAY 90 tablet 0   levothyroxine (SYNTHROID) 50 MCG tablet Take 1 tablet (50 mcg total) by mouth daily before breakfast. 90 tablet 1   meloxicam (MOBIC) 15 MG tablet TAKE 1 TABLET (15 MG TOTAL) BY MOUTH DAILY. 90 tablet 0   potassium chloride (KLOR-CON) 8 MEQ tablet TAKE 1 TABLET (8 MEQ TOTAL) BY MOUTH 3 (THREE) TIMES DAILY. 270 tablet 0   traZODone (DESYREL) 100 MG tablet TAKE 2 TABLETS BY MOUTH AT BEDTIME. 180 tablet 1   TRELEGY ELLIPTA 200-62.5-25 MCG/ACT AEPB Inhale 1 puff into the lungs daily. 120 each 1   No facility-administered medications prior to visit.    ROS Review of Systems  Constitutional: Negative.  Negative for appetite change, chills, diaphoresis, fatigue, fever and unexpected weight change.  HENT: Negative.  Negative for sore throat and trouble swallowing.   Respiratory:  Positive for cough, shortness of breath and wheezing. Negative for stridor.   Cardiovascular:  Positive for palpitations.  Gastrointestinal:  Negative for abdominal pain, diarrhea and nausea.  Endocrine: Negative.   Genitourinary: Negative.  Negative for difficulty urinating.  Musculoskeletal:  Positive for arthralgias.  Skin: Negative.   Neurological: Negative.  Negative for dizziness, weakness and  light-headedness.  Hematological: Negative.   Psychiatric/Behavioral: Negative.      Objective:  BP 136/88 (BP Location: Left Arm, Patient Position: Sitting, Cuff Size: Large)   Pulse 73   Temp 98.1 F (36.7 C) (Oral)   Ht '5\' 6"'$  (1.676 m)   Wt 245 lb (111.1 kg)   LMP 03/01/2004   SpO2 97%   BMI 39.54 kg/m   BP Readings from Last 3 Encounters:  02/10/22 136/88  08/11/21 138/88  04/20/21 132/86    Wt Readings from Last 3 Encounters:  02/10/22 245 lb (111.1 kg)  08/11/21 247 lb (112 kg)  04/20/21 252 lb 6.4 oz (114.5 kg)    Physical Exam Cardiovascular:     Rate and Rhythm: Normal rate and regular rhythm.     Heart sounds: Normal heart sounds, S1 normal and S2 normal. No murmur heard.    No friction rub. No gallop.     Comments: EKG- NSR, 61 bpm TWI in V1-V3 No LVH or Q waves Pulmonary:     Effort: Pulmonary effort is normal.     Breath sounds: Examination of the right-lower field reveals rales. Rales present. No decreased breath sounds, wheezing or rhonchi.  Musculoskeletal:     Right lower leg: No edema.     Left lower leg: No edema.     Lab Results  Component Value Date   WBC 5.5 02/10/2022   HGB 13.6 02/10/2022   HCT 40.2 02/10/2022   PLT 245.0 02/10/2022   GLUCOSE 89 02/10/2022   CHOL 267 (H) 04/20/2021  TRIG 83.0 04/20/2021   HDL 76.30 04/20/2021   LDLDIRECT 155.6 02/16/2013   LDLCALC 174 (H) 04/20/2021   ALT 15 04/20/2021   AST 17 04/20/2021   NA 137 02/10/2022   K 3.7 02/10/2022   CL 102 02/10/2022   CREATININE 0.79 02/10/2022   BUN 18 02/10/2022   CO2 26 02/10/2022   TSH 1.05 02/10/2022   INR 3.2 11/24/2012    CT CHEST LUNG CA SCREEN LOW DOSE W/O CM  Result Date: 11/23/2021 CLINICAL DATA:  Current smoker, 25 pack-year history. EXAM: CT CHEST WITHOUT CONTRAST LOW-DOSE FOR LUNG CANCER SCREENING TECHNIQUE: Multidetector CT imaging of the chest was performed following the standard protocol without IV contrast. RADIATION DOSE REDUCTION: This  exam was performed according to the departmental dose-optimization program which includes automated exposure control, adjustment of the mA and/or kV according to patient size and/or use of iterative reconstruction technique. COMPARISON:  02/26/2021. FINDINGS: Cardiovascular: Atherosclerotic calcification of the aorta. Heart size normal. No pericardial effusion. Mediastinum/Nodes: No pathologically enlarged mediastinal or axillary lymph nodes. Hilar regions are difficult to definitively evaluate without IV contrast. Esophagus is grossly unremarkable. Lungs/Pleura: Centrilobular emphysema. Smoking related respiratory bronchiolitis. No suspicious pulmonary nodules. No pleural fluid. Airway is unremarkable. Upper Abdomen: Visualized portions of the liver and adrenal glands are unremarkable. Cholecystectomy. Subcentimeter low-attenuation lesion in the right kidney, too small to characterize. No specific follow-up necessary. Visualized portions of the kidneys, spleen, pancreas, stomach and bowel are otherwise grossly unremarkable. Musculoskeletal: Degenerative changes in the spine. No worrisome lytic or sclerotic lesions. IMPRESSION: 1. Lung-RADS 1, negative. Continue annual screening with low-dose chest CT without contrast in 12 months. 2.  Aortic atherosclerosis (ICD10-I70.0). 3.  Emphysema (ICD10-J43.9). Electronically Signed   By: Lorin Picket M.D.   On: 11/23/2021 13:38   No results found.   Assessment & Plan:   Denise Morgan was seen today for cough, hypertension, hypothyroidism and asthma.  Diagnoses and all orders for this visit:  Essential hypertension -     Basic metabolic panel; Future -     CBC with Differential/Platelet; Future -     TSH; Future -     EKG 12-Lead -     TSH -     CBC with Differential/Platelet -     Basic metabolic panel  Acquired hypothyroidism -     TSH; Future -     TSH  Mild intermittent asthma without complication -     CBC with Differential/Platelet; Future -      fluticasone-salmeterol (ADVAIR) 100-50 MCG/ACT AEPB; Inhale 1 puff into the lungs 2 (two) times daily. -     CBC with Differential/Platelet  Need for hepatitis C screening test -     Hepatitis C antibody; Future -     Hepatitis C antibody  Subacute cough -     DG Chest 2 View; Future -     promethazine-dextromethorphan (PROMETHAZINE-DM) 6.25-15 MG/5ML syrup; Take 5 mLs by mouth 4 (four) times daily as needed for cough.  Intermittent palpitations -     CARDIAC EVENT MONITOR; Future   I have discontinued Denise Morgan's Trelegy Ellipta. I am also having her start on fluticasone-salmeterol and promethazine-dextromethorphan. Additionally, I am having her maintain her potassium chloride, levothyroxine, albuterol, indapamide, meloxicam, traZODone, and HYDROcodone-acetaminophen.  Meds ordered this encounter  Medications   fluticasone-salmeterol (ADVAIR) 100-50 MCG/ACT AEPB    Sig: Inhale 1 puff into the lungs 2 (two) times daily.    Dispense:  120 each    Refill:  1  promethazine-dextromethorphan (PROMETHAZINE-DM) 6.25-15 MG/5ML syrup    Sig: Take 5 mLs by mouth 4 (four) times daily as needed for cough.    Dispense:  118 mL    Refill:  0     Follow-up: Return in about 3 months (around 05/12/2022).  Scarlette Calico, MD

## 2022-02-11 LAB — HEPATITIS C ANTIBODY: Hepatitis C Ab: NONREACTIVE

## 2022-02-15 ENCOUNTER — Encounter: Payer: Self-pay | Admitting: Internal Medicine

## 2022-02-15 MED ORDER — PROMETHAZINE-DM 6.25-15 MG/5ML PO SYRP: 5.0000 mL | ORAL_SOLUTION | Freq: Four times a day (QID) | ORAL | 0 refills | Status: DC | PRN

## 2022-02-26 ENCOUNTER — Ambulatory Visit: Payer: Commercial Managed Care - HMO | Attending: Internal Medicine

## 2022-02-26 ENCOUNTER — Encounter: Payer: Self-pay | Admitting: Family Medicine

## 2022-02-26 ENCOUNTER — Ambulatory Visit (INDEPENDENT_AMBULATORY_CARE_PROVIDER_SITE_OTHER): Payer: Commercial Managed Care - HMO | Admitting: Family Medicine

## 2022-02-26 VITALS — BP 132/86 | HR 98 | Temp 98.3°F | Ht 66.0 in | Wt 238.0 lb

## 2022-02-26 DIAGNOSIS — R002 Palpitations: Secondary | ICD-10-CM | POA: Diagnosis not present

## 2022-02-26 DIAGNOSIS — R6883 Chills (without fever): Secondary | ICD-10-CM | POA: Diagnosis not present

## 2022-02-26 DIAGNOSIS — R0981 Nasal congestion: Secondary | ICD-10-CM | POA: Diagnosis not present

## 2022-02-26 DIAGNOSIS — J4520 Mild intermittent asthma, uncomplicated: Secondary | ICD-10-CM

## 2022-02-26 DIAGNOSIS — J029 Acute pharyngitis, unspecified: Secondary | ICD-10-CM | POA: Diagnosis not present

## 2022-02-26 DIAGNOSIS — R52 Pain, unspecified: Secondary | ICD-10-CM

## 2022-02-26 DIAGNOSIS — J22 Unspecified acute lower respiratory infection: Secondary | ICD-10-CM

## 2022-02-26 DIAGNOSIS — R052 Subacute cough: Secondary | ICD-10-CM

## 2022-02-26 LAB — POC COVID19 BINAXNOW: SARS Coronavirus 2 Ag: NEGATIVE

## 2022-02-26 MED ORDER — PROMETHAZINE-DM 6.25-15 MG/5ML PO SYRP: 5.0000 mL | ORAL_SOLUTION | Freq: Four times a day (QID) | ORAL | 0 refills | Status: DC | PRN

## 2022-02-26 MED ORDER — AZITHROMYCIN 250 MG PO TABS: ORAL_TABLET | ORAL | 0 refills | Status: DC

## 2022-02-26 MED ORDER — BENZONATATE 200 MG PO CAPS: 200.0000 mg | ORAL_CAPSULE | Freq: Two times a day (BID) | ORAL | 0 refills | Status: DC | PRN

## 2022-02-26 NOTE — Progress Notes (Signed)
Subjective: Chief Complaint  Patient presents with   Cough    Started 2 weeks ago   Nasal Congestion    Dark yellow with a little blood in mucus   Sore Throat   Generalized Body Aches    Started yesterday, was worse then   Chills    Started yesterday, was worse then     Denise Morgan is a 56 y.o. female who presents for a 2 wk hx of cough, nasal congestion and now she is having chills, body aches, and sore throat.   Denies dizziness, chest pain, palpitations, shortness of breath, N/V/D.   She has underlying asthma and is using Advair and albuterol inhalers.   ROS as in subjective.   Objective: Vitals:   02/26/22 1259  BP: 132/86  Pulse: 98  Temp: 98.3 F (36.8 C)  SpO2: 98%    General appearance: Alert, WD/WN, no distress, mildly ill appearing                             Skin: warm, no rash                           Head: no sinus tenderness                            Eyes: conjunctiva normal, corneas clear, PERRLA                            Ears: pearly TMs, external ear canals normal                          Nose: septum midline, turbinates swollen, with erythema              Mouth/throat: MMM, tongue normal, pharyngeal erythema                           Neck: supple, anterior cervical adenopathy with TTP                          Heart: RRR                         Lungs: CTA bilaterally, no wheezes, rales, or rhonchi      Assessment: Lower respiratory infection - Plan: azithromycin (ZITHROMAX) 250 MG tablet  Nasal congestion - Plan: POC COVID-19  Body aches - Plan: POC COVID-19  Chills - Plan: POC COVID-19  Acute pharyngitis, unspecified etiology - Plan: POC COVID-19  Subacute cough - Plan: azithromycin (ZITHROMAX) 250 MG tablet, benzonatate (TESSALON) 200 MG capsule, promethazine-dextromethorphan (PROMETHAZINE-DM) 6.25-15 MG/5ML syrup  Mild intermittent asthma without complication   Plan: COVID test negative.  Unable to test her for flu due to  being out of the testing supplies. Azithromycin, Tessalon Perles and Promethazine DM prescribed. Continue Advair and albuterol for asthma. Suggested symptomatic OTC remedies. Follow-up if worsening

## 2022-02-26 NOTE — Patient Instructions (Signed)
Take the antibiotic as prescribed.  I also prescribed Tessalon Perles for cough, this medication is not sedating.  I refilled Promethazine DM which is a sedating cough medication.  Take Tylenol or ibuprofen for sore throat, headache and bodyaches.  Use a Flonase nasal spray for congestion and take Mucinex over-the-counter for cough and congestion.  Salt water gargles for your sore throat as well as throat lozenges and good hydration.

## 2022-03-03 ENCOUNTER — Other Ambulatory Visit: Payer: Self-pay | Admitting: Internal Medicine

## 2022-03-03 ENCOUNTER — Encounter: Payer: Self-pay | Admitting: Internal Medicine

## 2022-03-03 ENCOUNTER — Other Ambulatory Visit: Payer: Self-pay | Admitting: Family Medicine

## 2022-03-03 DIAGNOSIS — E039 Hypothyroidism, unspecified: Secondary | ICD-10-CM

## 2022-03-03 DIAGNOSIS — F5104 Psychophysiologic insomnia: Secondary | ICD-10-CM

## 2022-03-03 DIAGNOSIS — M160 Bilateral primary osteoarthritis of hip: Secondary | ICD-10-CM

## 2022-03-03 DIAGNOSIS — G894 Chronic pain syndrome: Secondary | ICD-10-CM

## 2022-03-03 DIAGNOSIS — M170 Bilateral primary osteoarthritis of knee: Secondary | ICD-10-CM

## 2022-03-03 DIAGNOSIS — J019 Acute sinusitis, unspecified: Secondary | ICD-10-CM

## 2022-03-03 MED ORDER — HYDROCODONE-ACETAMINOPHEN 5-325 MG PO TABS: 1.0000 | ORAL_TABLET | Freq: Three times a day (TID) | ORAL | 0 refills | Status: DC | PRN

## 2022-03-03 MED ORDER — AMOXICILLIN-POT CLAVULANATE 875-125 MG PO TABS: 1.0000 | ORAL_TABLET | Freq: Two times a day (BID) | ORAL | 0 refills | Status: DC

## 2022-03-24 ENCOUNTER — Ambulatory Visit
Admission: RE | Admit: 2022-03-24 | Discharge: 2022-03-24 | Disposition: A | Discharge status: Home / Self Care | Payer: 59 | Source: Ambulatory Visit | Attending: Internal Medicine | Admitting: Internal Medicine

## 2022-03-24 DIAGNOSIS — Z1231 Encounter for screening mammogram for malignant neoplasm of breast: Secondary | ICD-10-CM

## 2022-03-29 ENCOUNTER — Other Ambulatory Visit: Payer: Self-pay | Admitting: Internal Medicine

## 2022-03-29 DIAGNOSIS — I10 Essential (primary) hypertension: Secondary | ICD-10-CM

## 2022-03-31 ENCOUNTER — Other Ambulatory Visit: Payer: Self-pay | Admitting: Internal Medicine

## 2022-03-31 DIAGNOSIS — G894 Chronic pain syndrome: Secondary | ICD-10-CM

## 2022-03-31 DIAGNOSIS — M170 Bilateral primary osteoarthritis of knee: Secondary | ICD-10-CM

## 2022-03-31 DIAGNOSIS — M160 Bilateral primary osteoarthritis of hip: Secondary | ICD-10-CM

## 2022-04-01 ENCOUNTER — Encounter: Payer: Self-pay | Admitting: Internal Medicine

## 2022-04-03 MED ORDER — HYDROCODONE-ACETAMINOPHEN 5-325 MG PO TABS: 1.0000 | ORAL_TABLET | Freq: Three times a day (TID) | ORAL | 0 refills | Status: DC | PRN

## 2022-04-05 ENCOUNTER — Other Ambulatory Visit: Payer: Self-pay | Admitting: Internal Medicine

## 2022-04-05 ENCOUNTER — Ambulatory Visit (INDEPENDENT_AMBULATORY_CARE_PROVIDER_SITE_OTHER)
Admission: RE | Admit: 2022-04-05 | Discharge: 2022-04-05 | Disposition: A | Discharge status: Home / Self Care | Payer: 59 | Source: Ambulatory Visit | Attending: Internal Medicine | Admitting: Internal Medicine

## 2022-04-05 ENCOUNTER — Ambulatory Visit: Payer: 59 | Admitting: Internal Medicine

## 2022-04-05 ENCOUNTER — Encounter: Payer: Self-pay | Admitting: Internal Medicine

## 2022-04-05 VITALS — BP 130/70 | HR 74 | Temp 98.1°F | Resp 16 | Ht 66.0 in | Wt 243.0 lb

## 2022-04-05 DIAGNOSIS — J302 Other seasonal allergic rhinitis: Secondary | ICD-10-CM

## 2022-04-05 DIAGNOSIS — R052 Subacute cough: Secondary | ICD-10-CM

## 2022-04-05 DIAGNOSIS — J019 Acute sinusitis, unspecified: Secondary | ICD-10-CM

## 2022-04-05 DIAGNOSIS — J4541 Moderate persistent asthma with (acute) exacerbation: Secondary | ICD-10-CM | POA: Diagnosis not present

## 2022-04-05 DIAGNOSIS — I10 Essential (primary) hypertension: Secondary | ICD-10-CM

## 2022-04-05 MED ORDER — METHYLPREDNISOLONE ACETATE 80 MG/ML IJ SUSP
120.0000 mg | Freq: Once | INTRAMUSCULAR | Status: AC
  Administered 2022-04-05: 120 mg via INTRAMUSCULAR

## 2022-04-05 MED ORDER — BENZONATATE 200 MG PO CAPS: 200.0000 mg | ORAL_CAPSULE | Freq: Two times a day (BID) | ORAL | 0 refills | Status: DC | PRN

## 2022-04-05 MED ORDER — FLUTICASONE PROPIONATE 50 MCG/ACT NA SUSP: 2.0000 | Freq: Every day | NASAL | 1 refills | Status: DC

## 2022-04-05 MED ORDER — AMOXICILLIN-POT CLAVULANATE 875-125 MG PO TABS: 1.0000 | ORAL_TABLET | Freq: Two times a day (BID) | ORAL | 0 refills | Status: DC

## 2022-04-05 MED ORDER — AIRSUPRA 90-80 MCG/ACT IN AERO: 2.0000 | INHALATION_SPRAY | Freq: Three times a day (TID) | RESPIRATORY_TRACT | 5 refills | Status: DC | PRN

## 2022-04-05 NOTE — Patient Instructions (Signed)

## 2022-04-05 NOTE — Progress Notes (Unsigned)
Subjective:  Patient ID: Denise Morgan, female    DOB: [birth year]  Age: 57 y.o. MRN: [id number]  CC: URI, Sinusitis, Allergic Rhinitis , and Asthma   HPI Denise Morgan presents for f/up ----  She complains of a 6 week history of cough productive of thick yellow/green phlegm.   Outpatient Medications Prior to Visit  Medication Sig Dispense Refill   albuterol (VENTOLIN HFA) 108 (90 Base) MCG/ACT inhaler INHALE 1-2 PUFFS BY MOUTH EVERY 6 HOURS AS NEEDED FOR WHEEZE OR SHORTNESS OF BREATH 8.5 each 3   fluticasone-salmeterol (ADVAIR) 100-50 MCG/ACT AEPB Inhale 1 puff into the lungs 2 (two) times daily. 120 each 1   HYDROcodone-acetaminophen (NORCO/VICODIN) 5-325 MG tablet Take 1 tablet by mouth every 8 (eight) hours as needed for moderate pain. 90 tablet 0   indapamide (LOZOL) 2.5 MG tablet TAKE 1 TABLET BY MOUTH EVERY DAY 30 tablet 2   levothyroxine (SYNTHROID) 50 MCG tablet TAKE 1 TABLET BY MOUTH DAILY BEFORE BREAKFAST 90 tablet 1   meloxicam (MOBIC) 15 MG tablet TAKE 1 TABLET (15 MG TOTAL) BY MOUTH DAILY. 90 tablet 0   potassium chloride (KLOR-CON) 8 MEQ tablet TAKE 1 TABLET (8 MEQ TOTAL) BY MOUTH 3 (THREE) TIMES DAILY. 270 tablet 0   promethazine-dextromethorphan (PROMETHAZINE-DM) 6.25-15 MG/5ML syrup Take 5 mLs by mouth 4 (four) times daily as needed for cough. 118 mL 0   traZODone (DESYREL) 100 MG tablet TAKE 1 TABLET BY MOUTH EVERYDAY AT BEDTIME 90 tablet 0   amoxicillin-clavulanate (AUGMENTIN) 875-125 MG tablet Take 1 tablet by mouth 2 (two) times daily. 20 tablet 0   benzonatate (TESSALON) 200 MG capsule Take 1 capsule (200 mg total) by mouth 2 (two) times daily as needed for cough. 20 capsule 0   No facility-administered medications prior to visit.    ROS Review of Systems  Constitutional:  Positive for unexpected weight change (wt gain). Negative for appetite change, chills, diaphoresis, fatigue and fever.  HENT:  Positive for postnasal drip, rhinorrhea, sinus pressure  and sinus pain. Negative for nosebleeds and sore throat.   Respiratory:  Positive for cough, shortness of breath and wheezing. Negative for chest tightness and stridor.   Cardiovascular:  Negative for chest pain, palpitations and leg swelling.  Gastrointestinal:  Negative for abdominal pain, constipation, diarrhea, nausea and vomiting.  Genitourinary: Negative.   Musculoskeletal:  Positive for arthralgias. Negative for myalgias.  Neurological: Negative.  Negative for dizziness, weakness, light-headedness and headaches.  Hematological:  Negative for adenopathy. Does not bruise/bleed easily.  Psychiatric/Behavioral: Negative.      Objective:  BP 130/70 (BP Location: Left Arm, Patient Position: Sitting, Cuff Size: Normal)   Pulse 74   Temp 98.1 F (36.7 C) (Oral)   Resp 16   Ht '5\' 6"'$  (1.676 m)   Wt 243 lb (110.2 kg)   LMP 03/01/2004   SpO2 96%   BMI 39.22 kg/m   BP Readings from Last 3 Encounters:  04/05/22 130/70  02/26/22 132/86  02/10/22 136/88    Wt Readings from Last 3 Encounters:  04/05/22 243 lb (110.2 kg)  02/26/22 238 lb (108 kg)  02/10/22 245 lb (111.1 kg)    Physical Exam Vitals reviewed.  Constitutional:      General: She is not in acute distress.    Appearance: She is obese. She is not toxic-appearing or diaphoretic.  HENT:     Nose: Nose normal.     Mouth/Throat:     Mouth: Mucous membranes are moist.  Eyes:  General: No scleral icterus.    Conjunctiva/sclera: Conjunctivae normal.  Cardiovascular:     Rate and Rhythm: Normal rate and regular rhythm.     Heart sounds: No murmur heard. Pulmonary:     Effort: Pulmonary effort is normal. No tachypnea, accessory muscle usage or respiratory distress.     Breath sounds: No stridor. Examination of the right-upper field reveals wheezing. Examination of the left-upper field reveals wheezing. Examination of the right-middle field reveals wheezing. Examination of the left-middle field reveals wheezing.  Examination of the left-lower field reveals wheezing. Wheezing present. No decreased breath sounds, rhonchi or rales.  Abdominal:     General: Abdomen is protuberant. Bowel sounds are normal. There is no distension.     Palpations: Abdomen is soft. There is no hepatomegaly, splenomegaly or mass.     Tenderness: There is no abdominal tenderness.  Musculoskeletal:        General: Normal range of motion.     Cervical back: Neck supple.     Right lower leg: No edema.     Left lower leg: No edema.  Lymphadenopathy:     Cervical: No cervical adenopathy.  Skin:    General: Skin is warm and dry.     Coloration: Skin is not pale.  Neurological:     General: No focal deficit present.     Mental Status: She is alert. Mental status is at baseline.  Psychiatric:        Mood and Affect: Mood normal.        Behavior: Behavior normal.     Lab Results  Component Value Date   WBC 5.5 02/10/2022   HGB 13.6 02/10/2022   HCT 40.2 02/10/2022   PLT 245.0 02/10/2022   GLUCOSE 89 02/10/2022   CHOL 267 (H) 04/20/2021   TRIG 83.0 04/20/2021   HDL 76.30 04/20/2021   LDLDIRECT 155.6 02/16/2013   LDLCALC 174 (H) 04/20/2021   ALT 15 04/20/2021   AST 17 04/20/2021   NA 137 02/10/2022   K 3.7 02/10/2022   CL 102 02/10/2022   CREATININE 0.79 02/10/2022   BUN 18 02/10/2022   CO2 26 02/10/2022   TSH 1.05 02/10/2022   INR 3.2 11/24/2012    MM 3D SCREEN BREAST BILATERAL  Result Date: 03/26/2022 CLINICAL DATA:  Screening. EXAM: DIGITAL SCREENING BILATERAL MAMMOGRAM WITH TOMOSYNTHESIS AND CAD TECHNIQUE: Bilateral screening digital craniocaudal and mediolateral oblique mammograms were obtained. Bilateral screening digital breast tomosynthesis was performed. The images were evaluated with computer-aided detection. COMPARISON:  Previous exam(s). ACR Breast Density Category a: The breast tissue is almost entirely fatty. FINDINGS: There are no findings suspicious for malignancy. IMPRESSION: No mammographic  evidence of malignancy. A result letter of this screening mammogram will be mailed directly to the patient. RECOMMENDATION: Screening mammogram in one year. (Code:SM-B-01Y) BI-RADS CATEGORY  1: Negative. Electronically Signed   By: Margarette Canada M.D.   On: 03/26/2022 13:01   DG Chest 2 View  Result Date: 04/05/2022 CLINICAL DATA:  Productive cough EXAM: CHEST - 2 VIEW COMPARISON:  Chest x-ray dated February 10, 2022 FINDINGS: The heart size and mediastinal contours are within normal limits. Both lungs are clear. The visualized skeletal structures are unremarkable. IMPRESSION: No active cardiopulmonary disease. Electronically Signed   By: Yetta Glassman M.D.   On: 04/05/2022 11:06   CARDIAC EVENT MONITOR  Result Date: 03/30/2022 NSR with sinus brady and sinus tachycardia No prolonged pauses No atrial fib, VT or SVT Noise artifact limits interpretation Occaisional PVCs and PAC's  Salome Spotted   MM 3D SCREEN BREAST BILATERAL  Result Date: 03/26/2022 CLINICAL DATA:  Screening. EXAM: DIGITAL SCREENING BILATERAL MAMMOGRAM WITH TOMOSYNTHESIS AND CAD TECHNIQUE: Bilateral screening digital craniocaudal and mediolateral oblique mammograms were obtained. Bilateral screening digital breast tomosynthesis was performed. The images were evaluated with computer-aided detection. COMPARISON:  Previous exam(s). ACR Breast Density Category a: The breast tissue is almost entirely fatty. FINDINGS: There are no findings suspicious for malignancy. IMPRESSION: No mammographic evidence of malignancy. A result letter of this screening mammogram will be mailed directly to the patient. RECOMMENDATION: Screening mammogram in one year. (Code:SM-B-01Y) BI-RADS CATEGORY  1: Negative. Electronically Signed   By: Margarette Canada M.D.   On: 03/26/2022 13:01   DG Chest 2 View  Result Date: 02/10/2022 CLINICAL DATA:  Cough. EXAM: CHEST - 2 VIEW COMPARISON:  04/20/2021 and CT chest 11/20/2021. FINDINGS: Trachea is midline. Heart size  stable. There may be minimal scarring in the right costophrenic angle. Lungs are otherwise clear. No pleural fluid. IMPRESSION: No acute findings. Electronically Signed   By: Lorin Picket M.D.   On: 02/10/2022 11:25     DG Chest 2 View  Result Date: 04/05/2022 CLINICAL DATA:  Productive cough EXAM: CHEST - 2 VIEW COMPARISON:  Chest x-ray dated February 10, 2022 FINDINGS: The heart size and mediastinal contours are within normal limits. Both lungs are clear. The visualized skeletal structures are unremarkable. IMPRESSION: No active cardiopulmonary disease. Electronically Signed   By: Yetta Glassman M.D.   On: 04/05/2022 11:06     Assessment & Plan:   Breland was seen today for uri, sinusitis, allergic rhinitis  and asthma.  Diagnoses and all orders for this visit:  Essential hypertension- Her BP is well controlled.  Seasonal allergic rhinitis due to fungal spores -     methylPREDNISolone acetate (DEPO-MEDROL) injection 120 mg -     fluticasone (FLONASE) 50 MCG/ACT nasal spray; Place 2 sprays into both nostrils daily.  Moderate persistent asthma with exacerbation -     methylPREDNISolone acetate (DEPO-MEDROL) injection 120 mg -     Albuterol-Budesonide (AIRSUPRA) 90-80 MCG/ACT AERO; Inhale 2 puffs into the lungs 3 (three) times daily as needed.  Subacute cough- CXR is negative for mass/infiltrate. -     DG Chest 2 View; Future -     benzonatate (TESSALON) 200 MG capsule; Take 1 capsule (200 mg total) by mouth 2 (two) times daily as needed for cough.  Acute sinusitis, recurrence not specified, unspecified location -     amoxicillin-clavulanate (AUGMENTIN) 875-125 MG tablet; Take 1 tablet by mouth 2 (two) times daily.   I am having Denise Morgan start on Airsupra and fluticasone. I am also having her maintain her potassium chloride, albuterol, meloxicam, fluticasone-salmeterol, promethazine-dextromethorphan, levothyroxine, traZODone, indapamide, HYDROcodone-acetaminophen,  benzonatate, and amoxicillin-clavulanate. We administered methylPREDNISolone acetate.  Meds ordered this encounter  Medications   methylPREDNISolone acetate (DEPO-MEDROL) injection 120 mg   Albuterol-Budesonide (AIRSUPRA) 90-80 MCG/ACT AERO    Sig: Inhale 2 puffs into the lungs 3 (three) times daily as needed.    Dispense:  10.7 g    Refill:  5   fluticasone (FLONASE) 50 MCG/ACT nasal spray    Sig: Place 2 sprays into both nostrils daily.    Dispense:  48 g    Refill:  1   benzonatate (TESSALON) 200 MG capsule    Sig: Take 1 capsule (200 mg total) by mouth 2 (two) times daily as needed for cough.    Dispense:  20 capsule  Refill:  0   amoxicillin-clavulanate (AUGMENTIN) 875-125 MG tablet    Sig: Take 1 tablet by mouth 2 (two) times daily.    Dispense:  20 tablet    Refill:  0     Follow-up: Return if symptoms worsen or fail to improve.  Scarlette Calico, MD

## 2022-04-21 ENCOUNTER — Other Ambulatory Visit (HOSPITAL_COMMUNITY): Payer: Self-pay

## 2022-04-28 ENCOUNTER — Other Ambulatory Visit: Payer: Self-pay | Admitting: Internal Medicine

## 2022-04-28 DIAGNOSIS — G894 Chronic pain syndrome: Secondary | ICD-10-CM

## 2022-04-28 DIAGNOSIS — M160 Bilateral primary osteoarthritis of hip: Secondary | ICD-10-CM

## 2022-04-28 DIAGNOSIS — M170 Bilateral primary osteoarthritis of knee: Secondary | ICD-10-CM

## 2022-04-30 MED ORDER — HYDROCODONE-ACETAMINOPHEN 5-325 MG PO TABS: 1.0000 | ORAL_TABLET | Freq: Three times a day (TID) | ORAL | 0 refills | Status: DC | PRN

## 2022-05-13 ENCOUNTER — Ambulatory Visit: Payer: 59 | Admitting: Internal Medicine

## 2022-05-13 ENCOUNTER — Encounter: Payer: Self-pay | Admitting: Internal Medicine

## 2022-05-13 VITALS — BP 138/88 | HR 66 | Temp 98.4°F | Resp 16 | Ht 66.0 in | Wt 241.0 lb

## 2022-05-13 DIAGNOSIS — E785 Hyperlipidemia, unspecified: Secondary | ICD-10-CM

## 2022-05-13 DIAGNOSIS — R040 Epistaxis: Secondary | ICD-10-CM | POA: Diagnosis not present

## 2022-05-13 DIAGNOSIS — J329 Chronic sinusitis, unspecified: Secondary | ICD-10-CM | POA: Diagnosis not present

## 2022-05-13 DIAGNOSIS — J340 Abscess, furuncle and carbuncle of nose: Secondary | ICD-10-CM

## 2022-05-13 DIAGNOSIS — I10 Essential (primary) hypertension: Secondary | ICD-10-CM

## 2022-05-13 DIAGNOSIS — M160 Bilateral primary osteoarthritis of hip: Secondary | ICD-10-CM

## 2022-05-13 LAB — HEPATIC FUNCTION PANEL
ALT: 18 U/L (ref 0–35)
AST: 18 U/L (ref 0–37)
Albumin: 4.3 g/dL (ref 3.5–5.2)
Alkaline Phosphatase: 67 U/L (ref 39–117)
Bilirubin, Direct: 0.1 mg/dL (ref 0.0–0.3)
Total Bilirubin: 0.7 mg/dL (ref 0.2–1.2)
Total Protein: 8 g/dL (ref 6.0–8.3)

## 2022-05-13 LAB — BASIC METABOLIC PANEL
BUN: 18 mg/dL (ref 6–23)
CO2: 26 mEq/L (ref 19–32)
Calcium: 9.6 mg/dL (ref 8.4–10.5)
Chloride: 99 mEq/L (ref 96–112)
Creatinine, Ser: 0.77 mg/dL (ref 0.40–1.20)
GFR: 85.91 mL/min (ref >60.00)
Glucose, Bld: 86 mg/dL (ref 70–99)
Potassium: 3.7 mEq/L (ref 3.5–5.1)
Sodium: 134 mEq/L — ABNORMAL LOW (ref 135–145)

## 2022-05-13 LAB — LIPID PANEL
Cholesterol: 272 mg/dL — ABNORMAL HIGH (ref 0–200)
HDL: 99.1 mg/dL (ref >39.00)
LDL Cholesterol: 159 mg/dL — ABNORMAL HIGH (ref 0–99)
NonHDL: 172.76
Total CHOL/HDL Ratio: 3
Triglycerides: 70 mg/dL (ref 0.0–149.0)
VLDL: 14 mg/dL (ref 0.0–40.0)

## 2022-05-13 NOTE — Progress Notes (Signed)
Subjective:  Patient ID: Denise Morgan, female    DOB: [birth year]  Age: 57 y.o. MRN: [id number]  CC: Osteoarthritis, Sinusitis, Hypothyroidism, and Hypertension   HPI Denise Morgan presents for a CPX and f/up ----  She complains of a 59-month history of yellow phlegm from her sinuses with nasal congestion and occasional epistaxis.  She has been treated with antibiotics and steroids with no improvement.  Outpatient Medications Prior to Visit  Medication Sig Dispense Refill   albuterol (VENTOLIN HFA) 108 (90 Base) MCG/ACT inhaler INHALE 1-2 PUFFS BY MOUTH EVERY 6 HOURS AS NEEDED FOR WHEEZE OR SHORTNESS OF BREATH 8.5 each 3   Albuterol-Budesonide (AIRSUPRA) 90-80 MCG/ACT AERO Inhale 2 puffs into the lungs 3 (three) times daily as needed. 10.7 g 5   fluticasone (FLONASE) 50 MCG/ACT nasal spray Place 2 sprays into both nostrils daily. 48 g 1   fluticasone-salmeterol (ADVAIR) 100-50 MCG/ACT AEPB Inhale 1 puff into the lungs 2 (two) times daily. 120 each 1   HYDROcodone-acetaminophen (NORCO/VICODIN) 5-325 MG tablet Take 1 tablet by mouth every 8 (eight) hours as needed for moderate pain. 90 tablet 0   indapamide (LOZOL) 2.5 MG tablet TAKE 1 TABLET BY MOUTH EVERY DAY 30 tablet 2   levothyroxine (SYNTHROID) 50 MCG tablet TAKE 1 TABLET BY MOUTH DAILY BEFORE BREAKFAST 90 tablet 1   meloxicam (MOBIC) 15 MG tablet TAKE 1 TABLET (15 MG TOTAL) BY MOUTH DAILY. 90 tablet 0   potassium chloride (KLOR-CON) 8 MEQ tablet TAKE 1 TABLET (8 MEQ TOTAL) BY MOUTH 3 (THREE) TIMES DAILY. 270 tablet 0   promethazine-dextromethorphan (PROMETHAZINE-DM) 6.25-15 MG/5ML syrup Take 5 mLs by mouth 4 (four) times daily as needed for cough. 118 mL 0   traZODone (DESYREL) 100 MG tablet TAKE 1 TABLET BY MOUTH EVERYDAY AT BEDTIME 90 tablet 0   amoxicillin-clavulanate (AUGMENTIN) 875-125 MG tablet Take 1 tablet by mouth 2 (two) times daily. 20 tablet 0   benzonatate (TESSALON) 200 MG capsule Take 1 capsule (200 mg total) by  mouth 2 (two) times daily as needed for cough. 20 capsule 0   No facility-administered medications prior to visit.    ROS Review of Systems  Constitutional: Negative.  Negative for chills, diaphoresis, fatigue and fever.  HENT:  Positive for congestion, nosebleeds, postnasal drip, rhinorrhea, sinus pressure and sinus pain. Negative for facial swelling, sore throat, trouble swallowing and voice change.   Respiratory:  Negative for cough, chest tightness, shortness of breath and wheezing.   Cardiovascular:  Negative for chest pain, palpitations and leg swelling.  Gastrointestinal:  Negative for abdominal pain, constipation, diarrhea, nausea and vomiting.  Genitourinary: Negative.  Negative for difficulty urinating.  Musculoskeletal:  Positive for arthralgias and back pain. Negative for joint swelling and myalgias.  Skin: Negative.  Negative for rash.  Neurological: Negative.  Negative for dizziness, weakness and light-headedness.  Hematological:  Negative for adenopathy. Does not bruise/bleed easily.  Psychiatric/Behavioral: Negative.      Objective:  BP 138/88 (BP Location: Left Arm, Patient Position: Sitting, Cuff Size: Large)   Pulse 66   Temp 98.4 F (36.9 C) (Oral)   Ht 5\' 6"  (1.676 m)   Wt 241 lb (109.3 kg)   LMP 03/01/2004   SpO2 94%   BMI 38.90 kg/m   BP Readings from Last 3 Encounters:  05/13/22 138/88  04/05/22 130/70  02/26/22 132/86    Wt Readings from Last 3 Encounters:  05/13/22 241 lb (109.3 kg)  04/05/22 243 lb (110.2 kg)  02/26/22  238 lb (108 kg)    Physical Exam Vitals reviewed.  HENT:     Nose: Congestion and rhinorrhea present.     Mouth/Throat:     Mouth: Mucous membranes are moist.     Pharynx: No oropharyngeal exudate or posterior oropharyngeal erythema.  Eyes:     General: No scleral icterus.    Conjunctiva/sclera: Conjunctivae normal.  Cardiovascular:     Rate and Rhythm: Normal rate and regular rhythm.     Heart sounds: No murmur  heard. Pulmonary:     Effort: Pulmonary effort is normal.     Breath sounds: No stridor. No wheezing, rhonchi or rales.  Abdominal:     General: Abdomen is protuberant. There is no distension.     Palpations: There is no hepatomegaly, splenomegaly or mass.     Tenderness: There is no abdominal tenderness. There is no guarding.     Hernia: No hernia is present.  Musculoskeletal:        General: Normal range of motion.     Cervical back: Neck supple.     Right lower leg: No edema.     Left lower leg: No edema.  Lymphadenopathy:     Cervical: No cervical adenopathy.  Skin:    General: Skin is warm and dry.  Neurological:     General: No focal deficit present.     Mental Status: She is alert.  Psychiatric:        Mood and Affect: Mood normal.        Behavior: Behavior normal.     Lab Results  Component Value Date   WBC 5.5 02/10/2022   HGB 13.6 02/10/2022   HCT 40.2 02/10/2022   PLT 245.0 02/10/2022   GLUCOSE 86 05/13/2022   CHOL 272 (H) 05/13/2022   TRIG 70.0 05/13/2022   HDL 99.10 05/13/2022   LDLDIRECT 155.6 02/16/2013   LDLCALC 159 (H) 05/13/2022   ALT 18 05/13/2022   AST 18 05/13/2022   NA 134 (L) 05/13/2022   K 3.7 05/13/2022   CL 99 05/13/2022   CREATININE 0.77 05/13/2022   BUN 18 05/13/2022   CO2 26 05/13/2022   TSH 1.05 02/10/2022   INR 3.2 11/24/2012    DG Chest 2 View  Result Date: 04/05/2022 CLINICAL DATA:  Productive cough EXAM: CHEST - 2 VIEW COMPARISON:  Chest x-ray dated February 10, 2022 FINDINGS: The heart size and mediastinal contours are within normal limits. Both lungs are clear. The visualized skeletal structures are unremarkable. IMPRESSION: No active cardiopulmonary disease. Electronically Signed   By: Yetta Glassman M.D.   On: 04/05/2022 11:06    Assessment & Plan:  Hyperlipidemia with target LDL less than 130- Statin is not indicated. -     Lipid panel; Future -     Hepatic function panel; Future  Epistaxis, recurrent- She is at risk  for nasopharyngeal carcinoma.  I recommended a CT scan with contrast to evaluate for mass, polyps, air-fluid levels, abscess, and obstruction of the ostiomeatal complex. -     CT MAXILLOFACIAL W CONTRAST; Future  Chronic recurrent sinusitis -     CT MAXILLOFACIAL W CONTRAST; Future  Abscess, furuncle and carbuncle of nose -     CT MAXILLOFACIAL W CONTRAST; Future  Essential hypertension- Her blood pressure is well-controlled. -     Basic metabolic panel; Future  Primary osteoarthritis of both hips -     Ambulatory referral to Orthopedic Surgery     Follow-up: No follow-ups on file.  Marcello Moores  Ronnald Ramp, MD

## 2022-05-14 ENCOUNTER — Encounter: Payer: Self-pay | Admitting: Internal Medicine

## 2022-05-19 IMAGING — CR DG CHEST 2V
2 series · 2 of 2 positions shown · non-contrast
Comparison: Chest x-ray 02/25/2021, CT chest 02/26/2021

CLINICAL DATA: cough 3 weeks

EXAM:
CHEST - 2 VIEW

[chest pa]
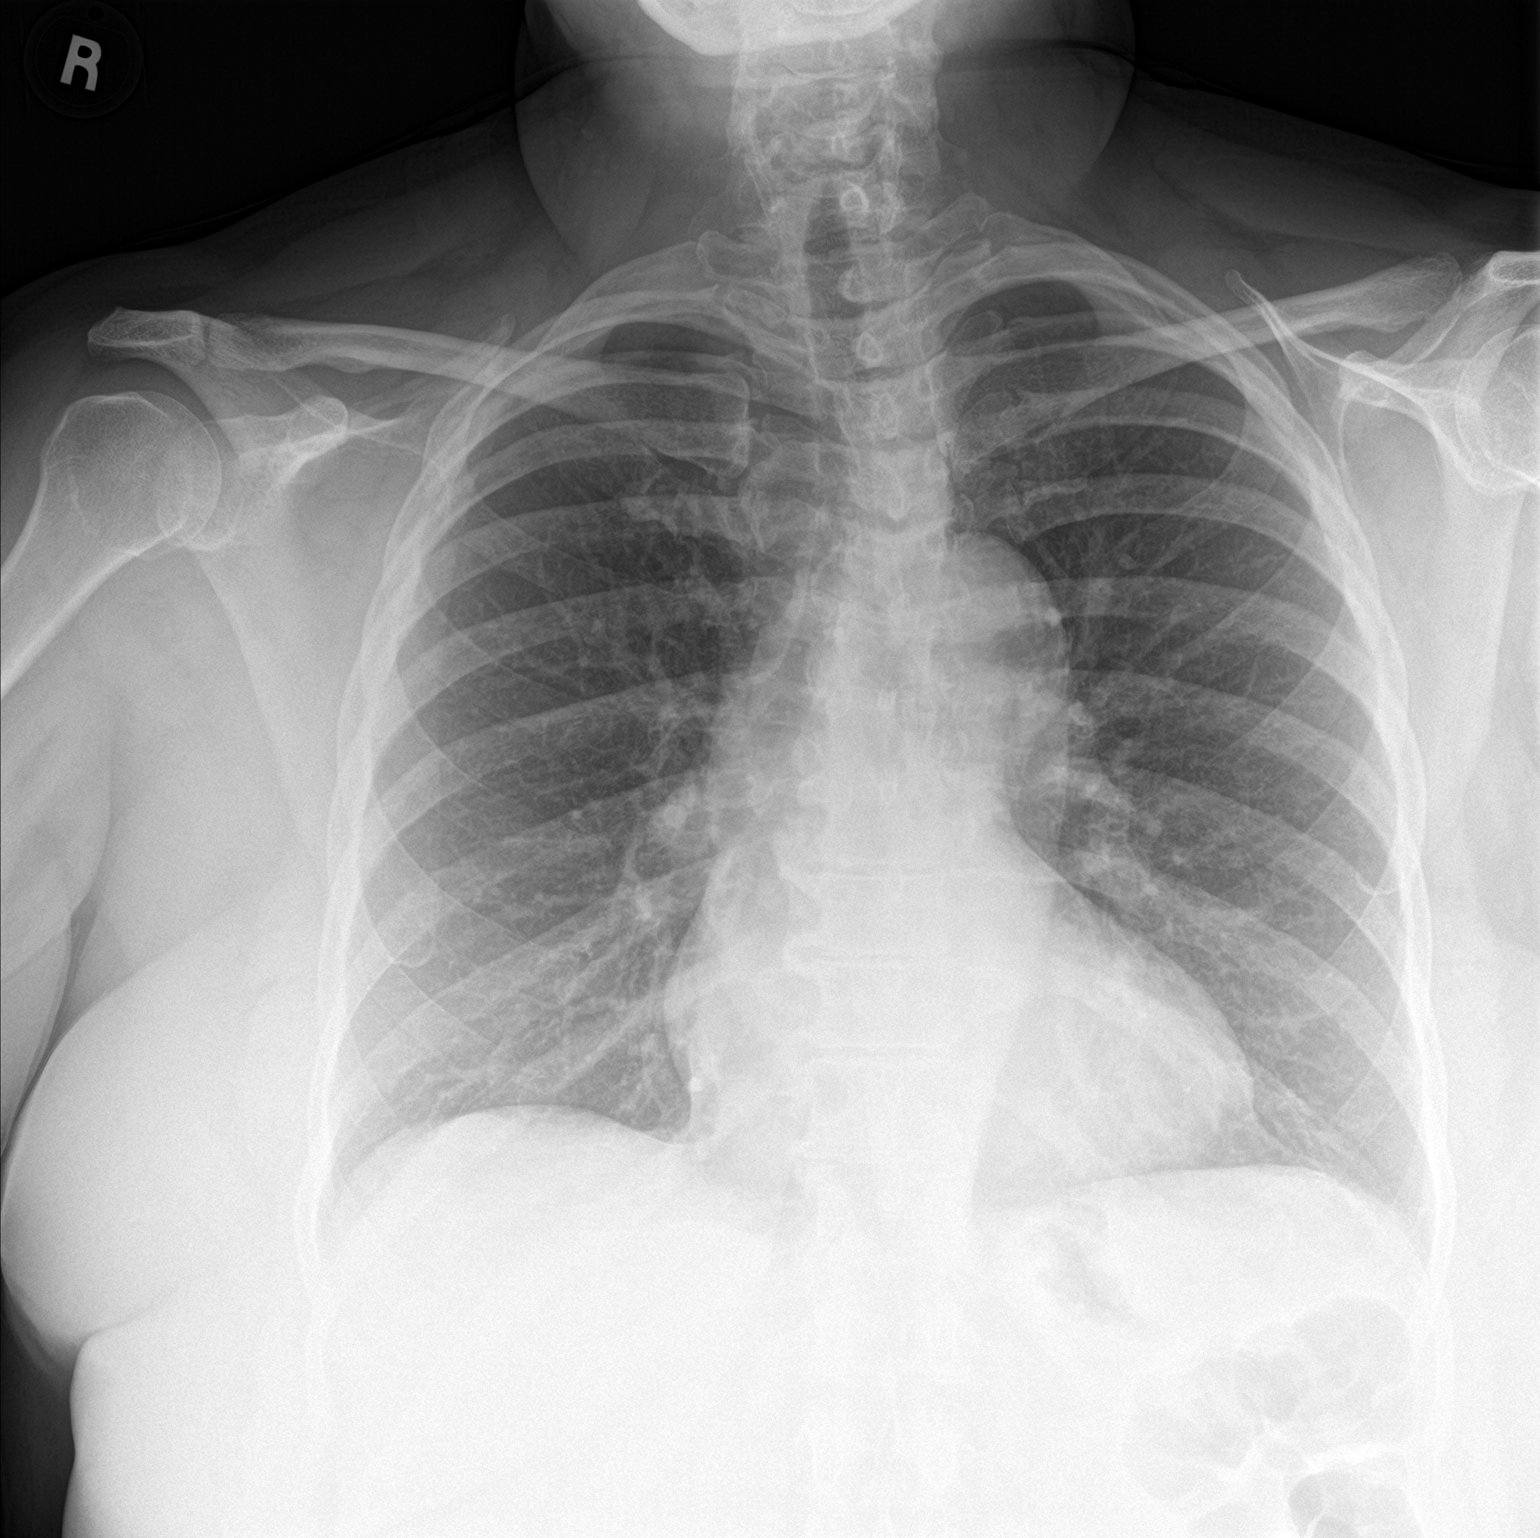

[chest lat]
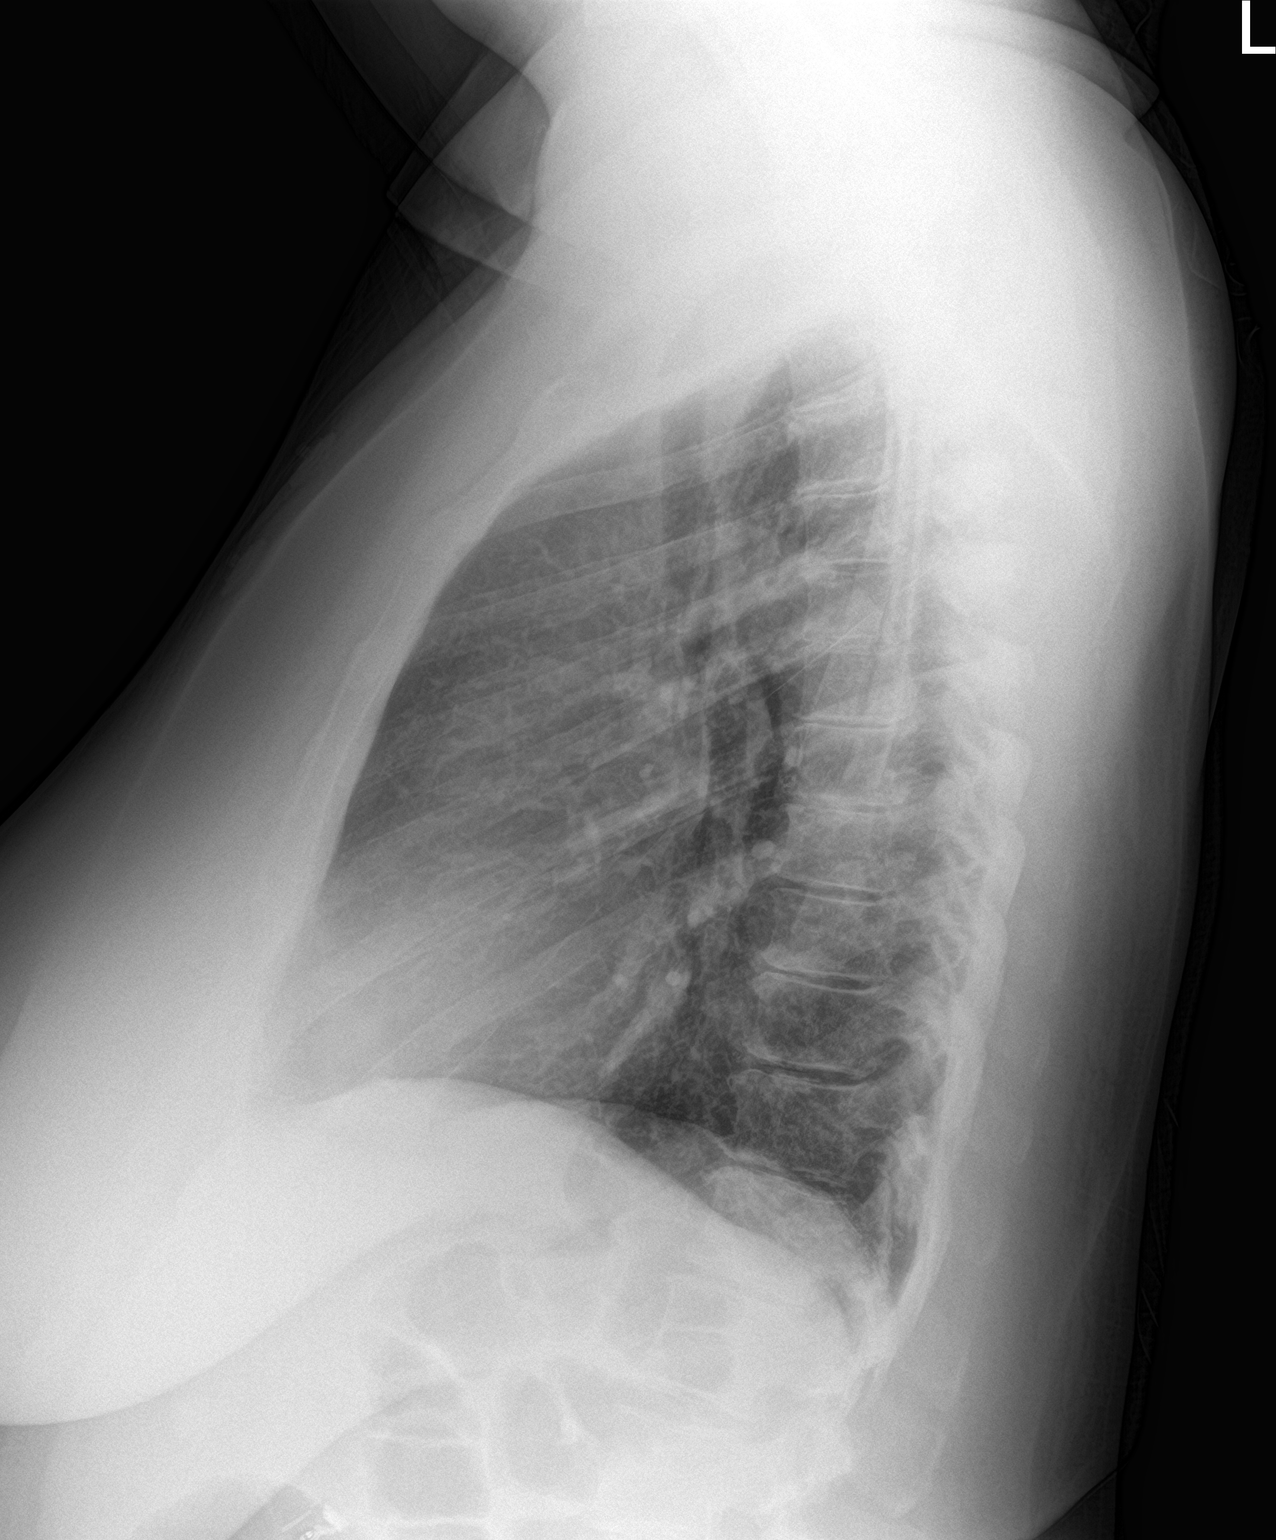

[2 of 2 positions shown; findings below may reference images not displayed]

FINDINGS: The heart and mediastinal contours are unchanged.

No focal consolidation. No pulmonary edema. No pleural effusion. No
pneumothorax.

No acute osseous abnormality.
IMPRESSION: No active cardiopulmonary disease.

## 2022-05-28 ENCOUNTER — Other Ambulatory Visit (HOSPITAL_COMMUNITY): Payer: Self-pay

## 2022-05-30 ENCOUNTER — Other Ambulatory Visit: Payer: Self-pay | Admitting: Internal Medicine

## 2022-05-30 DIAGNOSIS — M160 Bilateral primary osteoarthritis of hip: Secondary | ICD-10-CM

## 2022-05-30 DIAGNOSIS — F5104 Psychophysiologic insomnia: Secondary | ICD-10-CM

## 2022-05-30 DIAGNOSIS — G8929 Other chronic pain: Secondary | ICD-10-CM

## 2022-05-30 DIAGNOSIS — M170 Bilateral primary osteoarthritis of knee: Secondary | ICD-10-CM

## 2022-05-30 DIAGNOSIS — G894 Chronic pain syndrome: Secondary | ICD-10-CM

## 2022-05-30 DIAGNOSIS — M545 Low back pain, unspecified: Secondary | ICD-10-CM

## 2022-05-31 MED ORDER — TRAZODONE HCL 100 MG PO TABS: 100.0000 mg | ORAL_TABLET | Freq: Every day | ORAL | 0 refills | Status: DC

## 2022-05-31 MED ORDER — MELOXICAM 15 MG PO TABS: 15.0000 mg | ORAL_TABLET | Freq: Every day | ORAL | 0 refills | Status: DC

## 2022-05-31 MED ORDER — HYDROCODONE-ACETAMINOPHEN 5-325 MG PO TABS: 1.0000 | ORAL_TABLET | Freq: Three times a day (TID) | ORAL | 0 refills | Status: DC | PRN

## 2022-06-02 ENCOUNTER — Other Ambulatory Visit: Payer: Self-pay | Admitting: Internal Medicine

## 2022-06-02 DIAGNOSIS — R040 Epistaxis: Secondary | ICD-10-CM

## 2022-06-02 DIAGNOSIS — J329 Chronic sinusitis, unspecified: Secondary | ICD-10-CM

## 2022-06-02 DIAGNOSIS — J340 Abscess, furuncle and carbuncle of nose: Secondary | ICD-10-CM

## 2022-06-04 ENCOUNTER — Other Ambulatory Visit (HOSPITAL_COMMUNITY): Payer: Self-pay

## 2022-06-07 NOTE — Progress Notes (Unsigned)
Office Visit Note   Patient: Denise Morgan           Date of Birth: December 10, 1965           MRN: [id number] Visit Date: 06/08/2022              Requested by: Etta Grandchild, MD [address] [place],  [place] [address] PCP: Etta Grandchild, MD   Assessment & Plan: Visit Diagnoses:  1. Primary osteoarthritis of left hip   2. Primary osteoarthritis of right hip     Plan: Impression is 57 year old female with bilateral hip DJD worse on the left.  She does feel a leg length discrepancy but this is coming from her lumbar spine.  Disease process explained in detail.  She would like to try intra-articular steroid injections in both hips.  Total hip replacement handout provided.  She has my card if she needs anything.  Questions encouraged and answered.  Follow-up as needed for now.  Follow-Up Instructions: No follow-ups on file.   Orders:  No orders of the defined types were placed in this encounter.  No orders of the defined types were placed in this encounter.     Procedures: No procedures performed   Clinical Data: No additional findings.   Subjective: Chief Complaint  Patient presents with   Right Hip - Pain   Left Hip - Pain    HPI  Denise Morgan is a very pleasant 57 year old female here for evaluation of bilateral hip pain for years.  She has pain when turning in bed and lifting her leg into a car.  Feels like her left leg is longer.  She is restricted with activities due to the hip pain.  Review of Systems  Constitutional: Negative.   HENT: Negative.    Eyes: Negative.   Respiratory: Negative.    Cardiovascular: Negative.   Endocrine: Negative.   Musculoskeletal: Negative.   Neurological: Negative.   Hematological: Negative.   Psychiatric/Behavioral: Negative.    All other systems reviewed and are negative.    Objective: Vital Signs: LMP 03/01/2004   Physical Exam Vitals and nursing note reviewed.  Constitutional:      Appearance: She is  well-developed.  HENT:     Head: Atraumatic.     Nose: Nose normal.  Eyes:     Extraocular Movements: Extraocular movements intact.  Cardiovascular:     Pulses: Normal pulses.  Pulmonary:     Effort: Pulmonary effort is normal.  Abdominal:     Palpations: Abdomen is soft.  Musculoskeletal:     Cervical back: Neck supple.  Skin:    General: Skin is warm.     Capillary Refill: Capillary refill takes less than 2 seconds.  Neurological:     Mental Status: She is alert. Mental status is at baseline.  Psychiatric:        Behavior: Behavior normal.        Thought Content: Thought content normal.        Judgment: Judgment normal.    Ortho Exam  Examination bilateral hips show significant pain and catching with any movement of the hip joint.  No sciatic tension signs.  Lumbar spine exam is unremarkable.  There is no trochanteric tenderness.  Specialty Comments:  No specialty comments available.  Imaging: No results found.   PMFS History: Patient Active Problem List   Diagnosis Date Noted   Epistaxis, recurrent 05/13/2022   Chronic recurrent sinusitis 05/13/2022   Seasonal allergic rhinitis due to  fungal spores 04/05/2022   Moderate persistent asthma with exacerbation 04/05/2022   Encounter for long-term use of opiate analgesic 02/16/2020   Acquired hypothyroidism 02/12/2020   Essential hypertension 04/07/2016   Snoring 04/07/2016   Hx of adenomatous polyp of colon 08/15/2015   Asthma, mild intermittent 05/29/2015   Hyperlipidemia with target LDL less than 130 05/29/2015   Intermittent palpitations 05/29/2015   Subacromial bursitis 04/18/2014   Obesity, morbid 02/16/2013   Routine general medical examination at a health care facility 02/16/2013   Degenerative joint disease (DJD) of hip 02/16/2013   Tobacco abuse disorder 11/19/2012   Visit for screening mammogram 11/17/2012   DJD (degenerative joint disease) of knee 11/17/2012   Chronic pain syndrome 11/17/2012    Past Medical History:  Diagnosis Date   Allergy    Anemia    Arthritis    Asthma    Blood transfusion without reported diagnosis    Depression    Frequent headaches    History of phlebitis    Hx of adenomatous polyp of colon 08/15/2015   Palpitations    heart monitor all normal per pt.2017   PE (pulmonary embolism)     Family History  Problem Relation Age of Onset   Colon polyps Mother    Ovarian cancer Mother    Arthritis Mother    Colon cancer Maternal Aunt    Esophageal cancer Neg Hx    Rectal cancer Neg Hx    Stomach cancer Neg Hx     Past Surgical History:  Procedure Laterality Date   ABDOMINAL HYSTERECTOMY  03/01/2004   CHOLECYSTECTOMY     COLONOSCOPY W/ POLYPECTOMY  07/2020   Social History   Occupational History   Not on file  Tobacco Use   Smoking status: Every Day    Packs/day: 0.75    Years: 33.00    Additional pack years: 0.00    Total pack years: 24.75    Types: Cigarettes   Smokeless tobacco: Never  Substance and Sexual Activity   Alcohol use: Yes    Alcohol/week: 7.0 standard drinks of alcohol    Types: 2 Glasses of wine, 5 Shots of liquor per week    Comment: social   Drug use: No   Sexual activity: Not Currently    Birth control/protection: Surgical

## 2022-06-08 ENCOUNTER — Other Ambulatory Visit: Payer: Self-pay

## 2022-06-08 ENCOUNTER — Ambulatory Visit (INDEPENDENT_AMBULATORY_CARE_PROVIDER_SITE_OTHER): Payer: 59 | Admitting: Sports Medicine

## 2022-06-08 ENCOUNTER — Ambulatory Visit (INDEPENDENT_AMBULATORY_CARE_PROVIDER_SITE_OTHER): Payer: 59

## 2022-06-08 ENCOUNTER — Ambulatory Visit: Payer: 59 | Admitting: Orthopaedic Surgery

## 2022-06-08 ENCOUNTER — Encounter: Payer: Self-pay | Admitting: Orthopaedic Surgery

## 2022-06-08 DIAGNOSIS — M1611 Unilateral primary osteoarthritis, right hip: Secondary | ICD-10-CM

## 2022-06-08 DIAGNOSIS — M160 Bilateral primary osteoarthritis of hip: Secondary | ICD-10-CM

## 2022-06-08 DIAGNOSIS — M1612 Unilateral primary osteoarthritis, left hip: Secondary | ICD-10-CM

## 2022-06-08 MED ORDER — LIDOCAINE HCL 1 % IJ SOLN
4.0000 mL | INTRAMUSCULAR | Status: AC | PRN
Start: 2022-06-08 — End: 2022-06-08
  Administered 2022-06-08: 4 mL

## 2022-06-08 MED ORDER — METHYLPREDNISOLONE ACETATE 40 MG/ML IJ SUSP
40.0000 mg | INTRAMUSCULAR | Status: AC | PRN
Start: 2022-06-08 — End: 2022-06-08
  Administered 2022-06-08: 40 mg via INTRA_ARTICULAR

## 2022-06-08 NOTE — Progress Notes (Signed)
   Procedure Note  Patient: Denise Morgan             Date of Birth: November 20, 1965           MRN: [id number]             Visit Date: 06/08/2022  Procedures: Visit Diagnoses:  1. Primary osteoarthritis of left hip   2. Primary osteoarthritis of right hip    Large Joint Inj: L hip joint on 06/08/2022 8:54 AM Indications: pain Details: 22 G 3.5 in needle, ultrasound-guided anterior approach Medications: 4 mL lidocaine 1 %; 40 mg methylPREDNISolone acetate 40 MG/ML Outcome: tolerated well, no immediate complications  Procedure: US-guided intra-articular hip injection, left After discussion on risks/benefits/indications and informed verbal consent was obtained, a timeout was performed. Patient was lying supine on exam table. The hip was cleaned with betadine and alcohol swabs. Then utilizing ultrasound guidance, the patient's femoral head and neck junction was identified and subsequently injected with 4:1 lidocaine:depomedrol via an in-plane approach with ultrasound visualization of the injectate administered into the hip joint. Patient tolerated procedure well without immediate complications.  Procedure, treatment alternatives, risks and benefits explained, specific risks discussed. Consent was given by the patient. Immediately prior to procedure a time out was called to verify the correct patient, procedure, equipment, support staff and site/side marked as required. Patient was prepped and draped in the usual sterile fashion.    Large Joint Inj: R hip joint on 06/08/2022 8:54 AM Indications: pain Details: 22 G 3.5 in needle, ultrasound-guided anterior approach Medications: 4 mL lidocaine 1 %; 40 mg methylPREDNISolone acetate 40 MG/ML Outcome: tolerated well, no immediate complications  Procedure: US-guided intra-articular hip injection, right After discussion on risks/benefits/indications and informed verbal consent was obtained, a timeout was performed. Patient was lying supine on exam  table. The hip was cleaned with betadine and alcohol swabs. Then utilizing ultrasound guidance, the patient's femoral head and neck junction was identified and subsequently injected with 4:2 lidocaine:depomedrol via an in-plane approach with ultrasound visualization of the injectate administered into the hip joint. Patient tolerated procedure well without immediate complications.  Procedure, treatment alternatives, risks and benefits explained, specific risks discussed. Consent was given by the patient. Immediately prior to procedure a time out was called to verify the correct patient, procedure, equipment, support staff and site/side marked as required. Patient was prepped and draped in the usual sterile fashion.   - I evaluated the patient about 10 minutes post-injection and she had improvement in pain and range of motion - follow-up with Dr. Roda Shutters as indicated; I am happy to see them as needed  Madelyn Brunner, DO Primary Care Sports Medicine Physician  [facility] - Orthopedics  This note was dictated using Dragon naturally speaking software and may contain errors in syntax, spelling, or content which have not been identified prior to signing this note.

## 2022-06-09 ENCOUNTER — Other Ambulatory Visit: Payer: Self-pay | Admitting: Internal Medicine

## 2022-06-09 DIAGNOSIS — E876 Hypokalemia: Secondary | ICD-10-CM

## 2022-06-09 DIAGNOSIS — I10 Essential (primary) hypertension: Secondary | ICD-10-CM

## 2022-06-10 ENCOUNTER — Other Ambulatory Visit (HOSPITAL_COMMUNITY): Payer: Self-pay

## 2022-06-25 ENCOUNTER — Other Ambulatory Visit: Payer: Self-pay | Admitting: Internal Medicine

## 2022-06-25 DIAGNOSIS — M160 Bilateral primary osteoarthritis of hip: Secondary | ICD-10-CM

## 2022-06-25 DIAGNOSIS — M170 Bilateral primary osteoarthritis of knee: Secondary | ICD-10-CM

## 2022-06-25 DIAGNOSIS — I10 Essential (primary) hypertension: Secondary | ICD-10-CM

## 2022-06-25 DIAGNOSIS — G894 Chronic pain syndrome: Secondary | ICD-10-CM

## 2022-06-25 MED ORDER — HYDROCODONE-ACETAMINOPHEN 5-325 MG PO TABS
1.0000 | ORAL_TABLET | Freq: Three times a day (TID) | ORAL | 0 refills | Status: DC | PRN
Start: 2022-06-25 — End: 2022-07-20

## 2022-06-26 IMAGING — DX DG CHEST 2V
2 series · 2 of 2 positions shown · non-contrast
Comparison: 03/13/2021

CLINICAL DATA: Cough

EXAM:
CHEST - 2 VIEW

[chest pa]
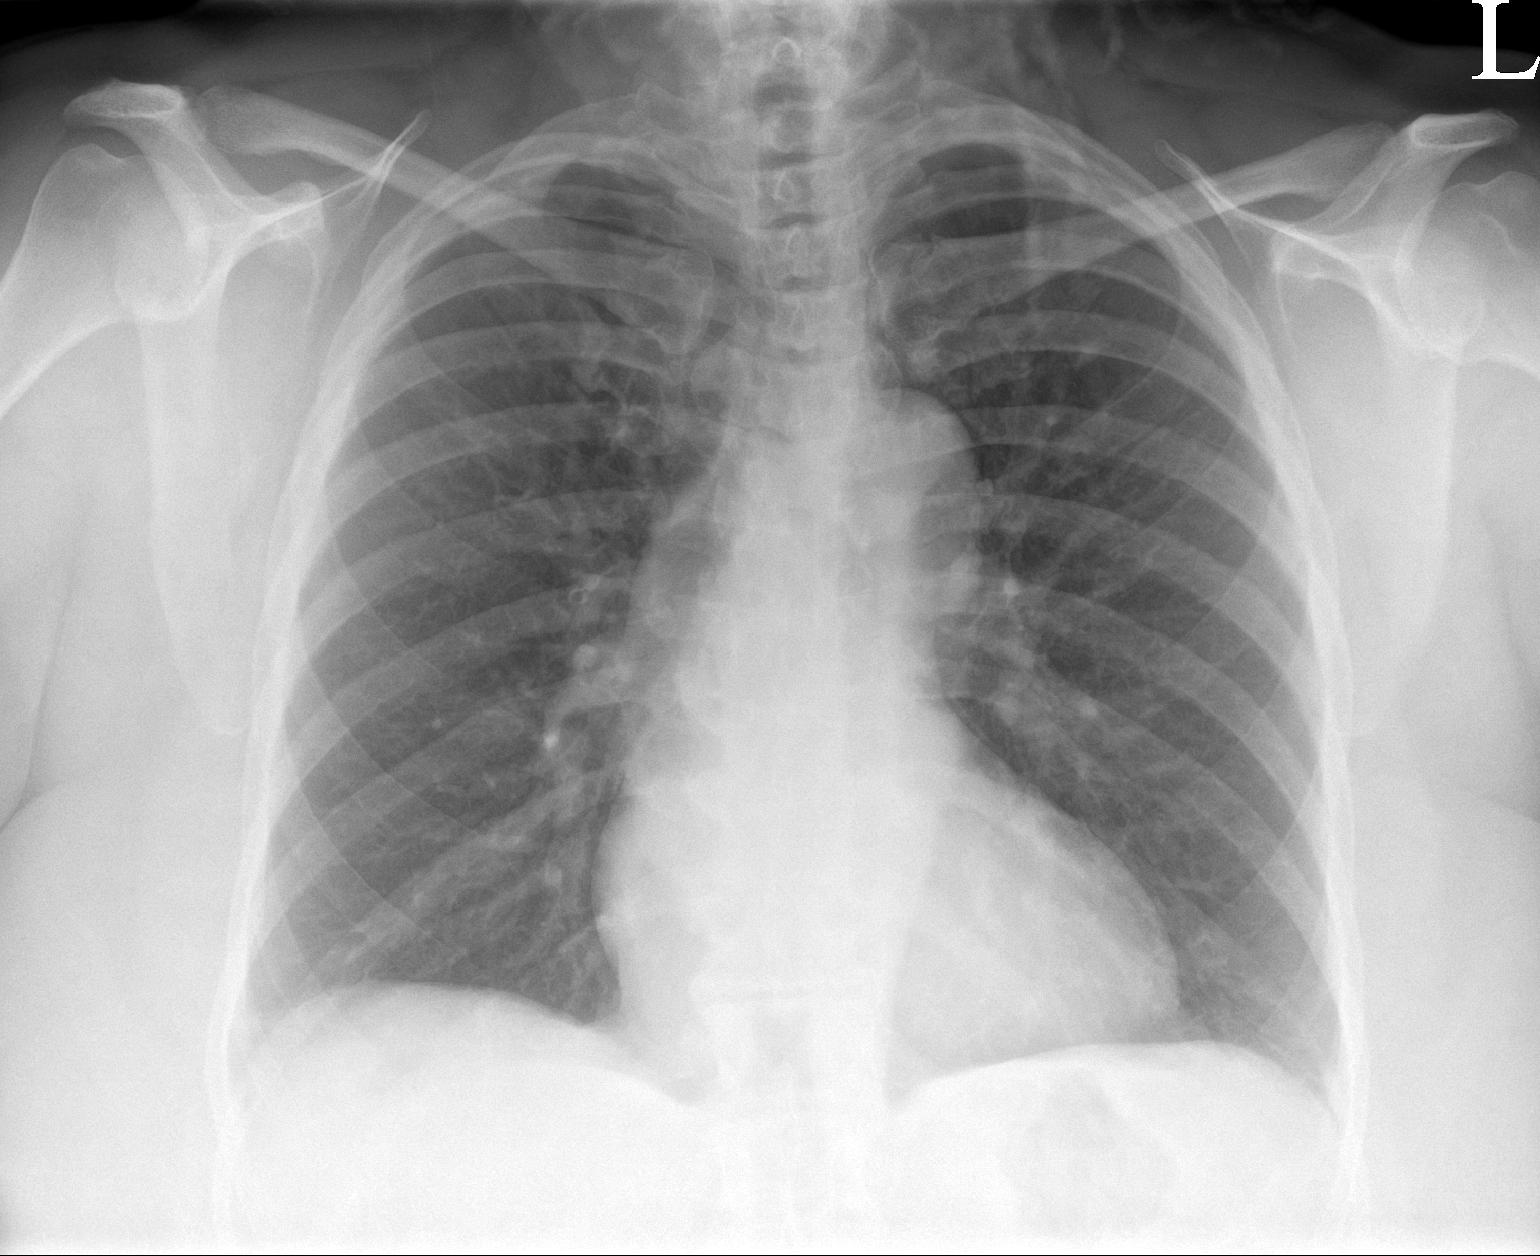

[chest lat]
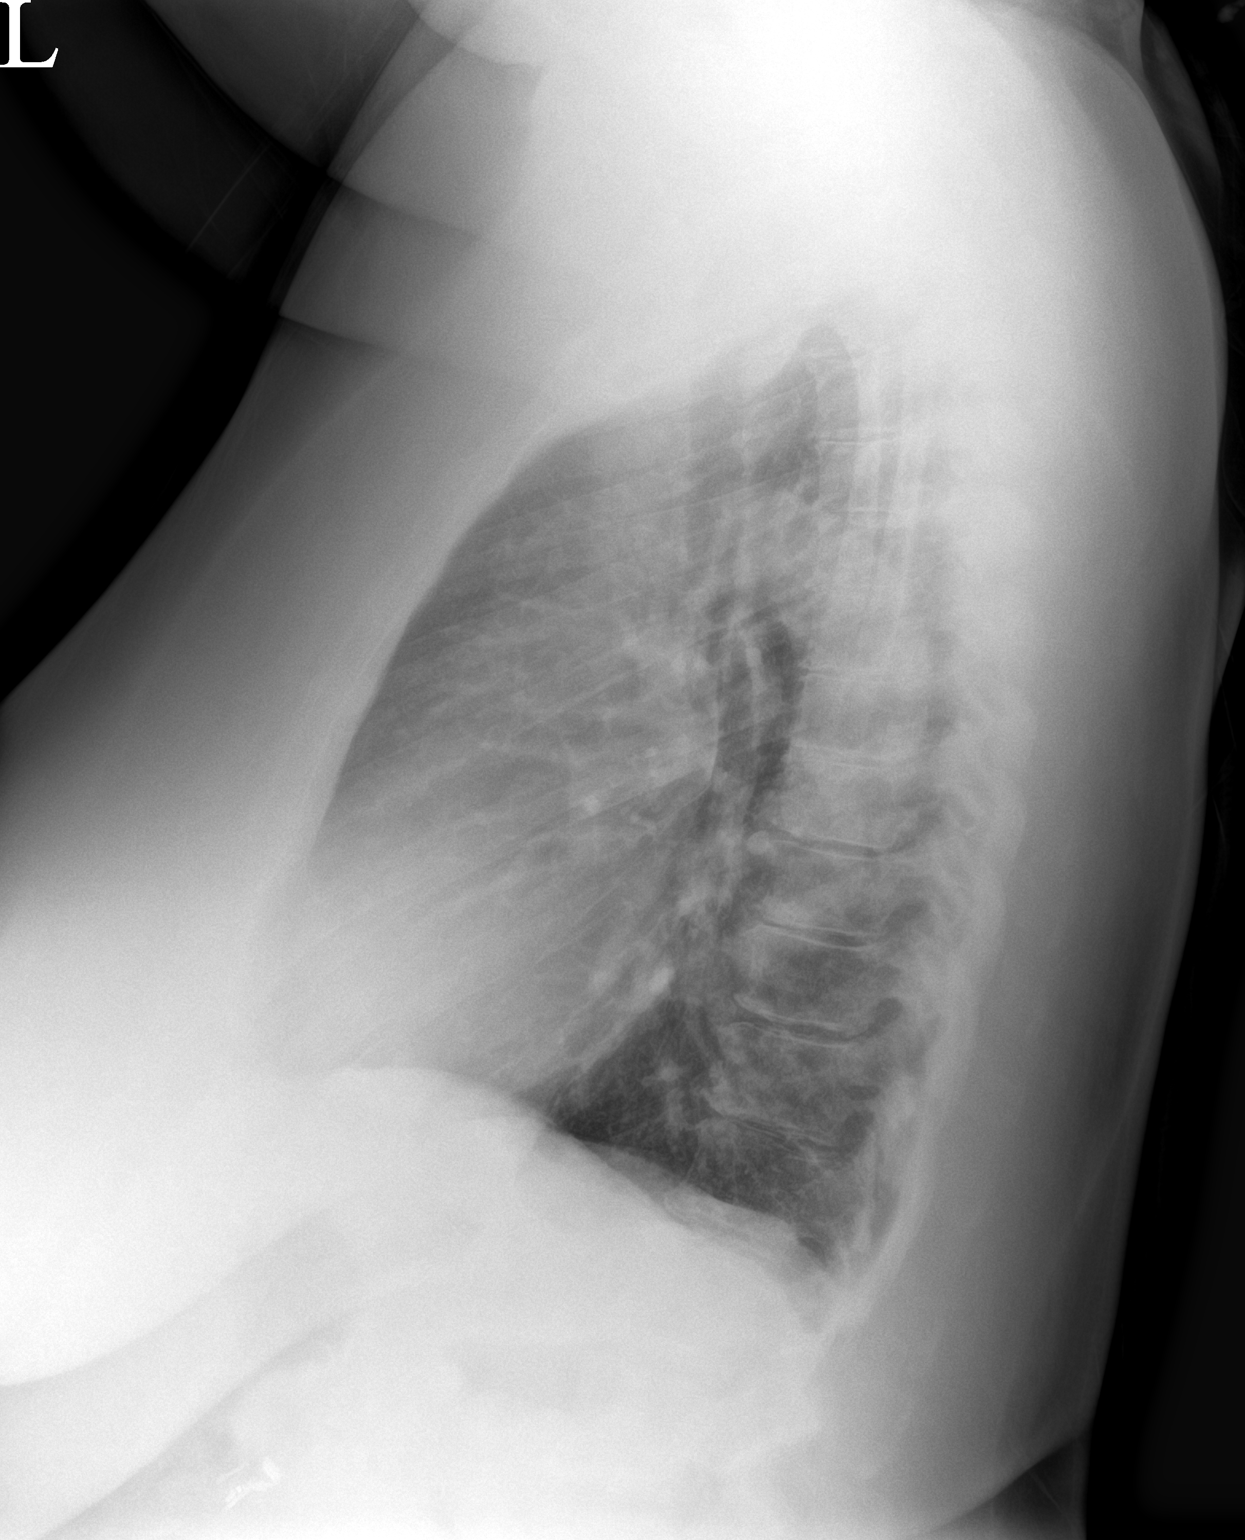

[2 of 2 positions shown; findings below may reference images not displayed]

FINDINGS: The heart size and mediastinal contours are within normal limits. No
focal airspace consolidation, pleural effusion, or pneumothorax. The
visualized skeletal structures are unremarkable.
IMPRESSION: No active cardiopulmonary disease.

## 2022-07-02 ENCOUNTER — Telehealth: Payer: Self-pay | Admitting: Internal Medicine

## 2022-07-02 NOTE — Telephone Encounter (Signed)
Pt called stating she been dealing  with continuing sinus infections on the regular. Pt wanted to speak with nurse about some type of medication that could possible help her. Pt also was inquiring about getting a CT scanned done that will be cover by her insurance.   Please follow up pt best call back [phone]

## 2022-07-02 NOTE — Telephone Encounter (Signed)
Called pt she states MD had order her a sinus CT, but they did not accept her insurance. Inform pt she will beed to call her insurance to see who in in her network. She states when she had the sinus infection she was using the nasal spray, and now her nose/sinus is stop back up. Inform her may need to make f/u appt, but will send msg to MD to rx what he gave her last visit.Pt is aware MD is out on Fridays....Raechel Chute

## 2022-07-05 ENCOUNTER — Encounter: Payer: Self-pay | Admitting: Internal Medicine

## 2022-07-13 ENCOUNTER — Encounter: Payer: Self-pay | Admitting: Family Medicine

## 2022-07-13 ENCOUNTER — Ambulatory Visit (INDEPENDENT_AMBULATORY_CARE_PROVIDER_SITE_OTHER): Payer: 59 | Admitting: Family Medicine

## 2022-07-13 VITALS — BP 126/84 | HR 75 | Temp 97.8°F | Ht 66.0 in | Wt 234.0 lb

## 2022-07-13 DIAGNOSIS — J329 Chronic sinusitis, unspecified: Secondary | ICD-10-CM

## 2022-07-13 DIAGNOSIS — J4520 Mild intermittent asthma, uncomplicated: Secondary | ICD-10-CM | POA: Diagnosis not present

## 2022-07-13 DIAGNOSIS — J019 Acute sinusitis, unspecified: Secondary | ICD-10-CM

## 2022-07-13 MED ORDER — PREDNISONE 20 MG PO TABS
40.0000 mg | ORAL_TABLET | Freq: Every day | ORAL | 0 refills | Status: DC
Start: 2022-07-13 — End: 2022-10-01

## 2022-07-13 MED ORDER — AMOXICILLIN-POT CLAVULANATE 875-125 MG PO TABS
1.0000 | ORAL_TABLET | Freq: Two times a day (BID) | ORAL | 0 refills | Status: DC
Start: 2022-07-13 — End: 2022-10-01

## 2022-07-13 NOTE — Patient Instructions (Signed)
STOP USING PHENYLEPHRINE (AFRIN) NASAL SPRAY.   As discussed, this is causing rebound congestion.   Start Flonase nasal spray. You may also try saline nasal spray and/or Astepro.   Take the oral antibiotics and oral steroids.   Get the CT as soon as you can.   Follow up with Dr. Yetta Barre.

## 2022-07-13 NOTE — Progress Notes (Signed)
Subjective:  Denise Morgan is a 57 y.o. female who presents for a 4 wk hx of nasal congestion, frontal headache, eyes swelling in the mornings, purulent nasal drainage, post nasal drainage. Not sleeping well due to congestion. Feeling miserable. Mild cough.  Denies fever, chills, dizziness, ST, chest pain, palpitations, shortness of breath, N/V/D  States she has been using generic Afrin for several weeks.   CT maxillofacial has been ordered by PCP. States her insurance will only cover it if done at [facility]. Needs the order changed.   ROS as in subjective.   Objective: Vitals:   07/13/22 0841  BP: 126/84  Pulse: 75  Temp: 97.8 F (36.6 C)  SpO2: 98%    General appearance: Alert, WD/WN, no distress, mildly ill appearing                             Skin: warm, no rash                           Head: + frontal and maxillary sinus tenderness                            Eyes: conjunctiva normal, corneas clear, PERRLA                            Ears: pearly TMs, external ear canals normal                          Nose: septum midline, turbinates swollen, with erythema             Mouth/throat: MMM, tongue normal, mild pharyngeal erythema                           Neck: supple, no adenopathy, no thyromegaly, nontender                          Heart: RRR                         Lungs: CTA bilaterally, no wheezes, rales, or rhonchi      Assessment: Acute sinusitis with symptoms > 10 days - Plan: amoxicillin-clavulanate (AUGMENTIN) 875-125 MG tablet, predniSONE (DELTASONE) 20 MG tablet  Chronic recurrent sinusitis  Mild intermittent asthma without complication  History of recurrent sinusitis.  Currently she is visibly uncomfortable.  She has been using Afrin and we discussed stopping this due to rebound congestion.  Augmentin and oral prednisone prescribed.  Recommend trying Flonase, Astelin nasal spray and saline nasal spray.  Use Tylenol or ibuprofen as needed.  She has a maxillofacial  CT ordered by her PCP but has had issues with insurance and where she can get this done.  She will let us know if she needs any assistance.   Plan: Discussed diagnosis and treatment of URI.  Suggested symptomatic OTC remedies. Nasal saline spray for congestion.  Tylenol or Ibuprofen OTC for fever and malaise.  Call/return in 2-3 days if symptoms aren't resolving.

## 2022-07-20 ENCOUNTER — Other Ambulatory Visit: Payer: Self-pay | Admitting: Internal Medicine

## 2022-07-20 DIAGNOSIS — M170 Bilateral primary osteoarthritis of knee: Secondary | ICD-10-CM

## 2022-07-20 DIAGNOSIS — G894 Chronic pain syndrome: Secondary | ICD-10-CM

## 2022-07-20 DIAGNOSIS — M160 Bilateral primary osteoarthritis of hip: Secondary | ICD-10-CM

## 2022-07-23 MED ORDER — HYDROCODONE-ACETAMINOPHEN 5-325 MG PO TABS
1.0000 | ORAL_TABLET | Freq: Three times a day (TID) | ORAL | 0 refills | Status: DC | PRN
Start: 2022-07-23 — End: 2022-08-21

## 2022-08-19 ENCOUNTER — Other Ambulatory Visit: Payer: Self-pay | Admitting: Internal Medicine

## 2022-08-19 DIAGNOSIS — M160 Bilateral primary osteoarthritis of hip: Secondary | ICD-10-CM

## 2022-08-19 DIAGNOSIS — M170 Bilateral primary osteoarthritis of knee: Secondary | ICD-10-CM

## 2022-08-19 DIAGNOSIS — G894 Chronic pain syndrome: Secondary | ICD-10-CM

## 2022-08-21 ENCOUNTER — Other Ambulatory Visit: Payer: Self-pay | Admitting: Internal Medicine

## 2022-08-21 DIAGNOSIS — M160 Bilateral primary osteoarthritis of hip: Secondary | ICD-10-CM

## 2022-08-21 DIAGNOSIS — M170 Bilateral primary osteoarthritis of knee: Secondary | ICD-10-CM

## 2022-08-21 DIAGNOSIS — G894 Chronic pain syndrome: Secondary | ICD-10-CM

## 2022-08-21 MED ORDER — HYDROCODONE-ACETAMINOPHEN 5-325 MG PO TABS
1.0000 | ORAL_TABLET | Freq: Three times a day (TID) | ORAL | 0 refills | Status: DC | PRN
Start: 2022-08-21 — End: 2022-09-15

## 2022-08-26 ENCOUNTER — Other Ambulatory Visit: Payer: Self-pay | Admitting: Internal Medicine

## 2022-08-26 DIAGNOSIS — M160 Bilateral primary osteoarthritis of hip: Secondary | ICD-10-CM

## 2022-08-26 DIAGNOSIS — M170 Bilateral primary osteoarthritis of knee: Secondary | ICD-10-CM

## 2022-08-26 DIAGNOSIS — G894 Chronic pain syndrome: Secondary | ICD-10-CM

## 2022-08-26 DIAGNOSIS — G8929 Other chronic pain: Secondary | ICD-10-CM

## 2022-08-31 ENCOUNTER — Other Ambulatory Visit (HOSPITAL_COMMUNITY): Payer: Self-pay

## 2022-08-31 ENCOUNTER — Telehealth: Payer: Self-pay

## 2022-08-31 NOTE — Telephone Encounter (Signed)
Pharmacy Patient Advocate Encounter   Received notification from CoverMyMeds that prior authorization for HYDROcodone-Acetaminophen 5-325MG  tablets is required/requested.   Insurance verification completed.   The patient is insured through [facility] .    PA started via CoverMyMeds. KEY [id number] . Waiting for clinical questions to populate.

## 2022-09-01 ENCOUNTER — Other Ambulatory Visit (HOSPITAL_COMMUNITY): Payer: Self-pay

## 2022-09-01 ENCOUNTER — Other Ambulatory Visit: Payer: Self-pay | Admitting: Internal Medicine

## 2022-09-01 DIAGNOSIS — E039 Hypothyroidism, unspecified: Secondary | ICD-10-CM

## 2022-09-01 NOTE — Telephone Encounter (Signed)
Patient Advocate Encounter   Received a fax from [facility] regarding Prior Authorization for HYDROcodone-Acetaminophen 5-325MG  tablets.  Key: [id number]    Patient picked up 08-24-2022

## 2022-09-08 ENCOUNTER — Other Ambulatory Visit: Payer: Self-pay | Admitting: Internal Medicine

## 2022-09-08 DIAGNOSIS — T502X5A Adverse effect of carbonic-anhydrase inhibitors, benzothiadiazides and other diuretics, initial encounter: Secondary | ICD-10-CM

## 2022-09-08 DIAGNOSIS — I10 Essential (primary) hypertension: Secondary | ICD-10-CM

## 2022-09-13 ENCOUNTER — Other Ambulatory Visit: Payer: Self-pay | Admitting: Internal Medicine

## 2022-09-13 DIAGNOSIS — F5104 Psychophysiologic insomnia: Secondary | ICD-10-CM

## 2022-09-13 DIAGNOSIS — G894 Chronic pain syndrome: Secondary | ICD-10-CM

## 2022-09-14 MED ORDER — TRAZODONE HCL 100 MG PO TABS
100.0000 mg | ORAL_TABLET | Freq: Every day | ORAL | 0 refills | Status: DC
Start: 2022-09-14 — End: 2022-10-04

## 2022-09-15 ENCOUNTER — Other Ambulatory Visit: Payer: Self-pay | Admitting: Internal Medicine

## 2022-09-15 DIAGNOSIS — G894 Chronic pain syndrome: Secondary | ICD-10-CM

## 2022-09-15 DIAGNOSIS — M160 Bilateral primary osteoarthritis of hip: Secondary | ICD-10-CM

## 2022-09-15 DIAGNOSIS — M170 Bilateral primary osteoarthritis of knee: Secondary | ICD-10-CM

## 2022-09-16 MED ORDER — HYDROCODONE-ACETAMINOPHEN 5-325 MG PO TABS
1.0000 | ORAL_TABLET | Freq: Three times a day (TID) | ORAL | 0 refills | Status: DC | PRN
Start: 2022-09-16 — End: 2022-10-01

## 2022-09-21 ENCOUNTER — Other Ambulatory Visit: Payer: Self-pay | Admitting: Internal Medicine

## 2022-09-21 DIAGNOSIS — I10 Essential (primary) hypertension: Secondary | ICD-10-CM

## 2022-09-29 ENCOUNTER — Telehealth: Payer: Self-pay | Admitting: Internal Medicine

## 2022-09-29 NOTE — Telephone Encounter (Signed)
Patient called and said her regular pharmacy doesn't have HYDROcodone-acetaminophen (NORCO/VICODIN) 5-325 MG tablet . She checked and the [facility] on [address] has it. She would like to know if the medication can be sent there because she is completely out of medication. Best callback is [phone].

## 2022-09-30 ENCOUNTER — Other Ambulatory Visit (HOSPITAL_COMMUNITY): Payer: Self-pay

## 2022-09-30 NOTE — Telephone Encounter (Signed)
Patient called to check on the status of her request. She has been out of the medication for a week. Best callback is [phone].

## 2022-09-30 NOTE — Telephone Encounter (Signed)
Pt needs Hydrocodone sent to [facility] on [address] [facility] not [facility] in [facility].

## 2022-10-01 ENCOUNTER — Other Ambulatory Visit: Payer: Self-pay | Admitting: Internal Medicine

## 2022-10-01 ENCOUNTER — Other Ambulatory Visit (HOSPITAL_COMMUNITY): Payer: Self-pay

## 2022-10-01 ENCOUNTER — Other Ambulatory Visit (HOSPITAL_BASED_OUTPATIENT_CLINIC_OR_DEPARTMENT_OTHER): Payer: Self-pay

## 2022-10-01 DIAGNOSIS — M170 Bilateral primary osteoarthritis of knee: Secondary | ICD-10-CM

## 2022-10-01 DIAGNOSIS — G894 Chronic pain syndrome: Secondary | ICD-10-CM

## 2022-10-01 DIAGNOSIS — M160 Bilateral primary osteoarthritis of hip: Secondary | ICD-10-CM

## 2022-10-01 MED ORDER — HYDROCODONE-ACETAMINOPHEN 5-325 MG PO TABS
1.0000 | ORAL_TABLET | Freq: Three times a day (TID) | ORAL | 0 refills | Status: DC | PRN
Start: 2022-10-01 — End: 2022-11-02
  Filled 2022-10-01: qty 90, 30d supply, fill #0

## 2022-10-02 ENCOUNTER — Other Ambulatory Visit (HOSPITAL_BASED_OUTPATIENT_CLINIC_OR_DEPARTMENT_OTHER): Payer: Self-pay

## 2022-10-04 ENCOUNTER — Other Ambulatory Visit: Payer: Self-pay | Admitting: Internal Medicine

## 2022-10-04 DIAGNOSIS — F5104 Psychophysiologic insomnia: Secondary | ICD-10-CM

## 2022-10-04 DIAGNOSIS — G894 Chronic pain syndrome: Secondary | ICD-10-CM

## 2022-10-22 ENCOUNTER — Ambulatory Visit (HOSPITAL_COMMUNITY)
Admission: RE | Admit: 2022-10-22 | Discharge: 2022-10-22 | Disposition: A | Discharge status: Home / Self Care | Payer: 59 | Source: Ambulatory Visit | Attending: Internal Medicine | Admitting: Internal Medicine

## 2022-10-22 DIAGNOSIS — J329 Chronic sinusitis, unspecified: Secondary | ICD-10-CM | POA: Diagnosis present

## 2022-10-22 DIAGNOSIS — R040 Epistaxis: Secondary | ICD-10-CM | POA: Diagnosis present

## 2022-10-22 DIAGNOSIS — J340 Abscess, furuncle and carbuncle of nose: Secondary | ICD-10-CM | POA: Diagnosis present

## 2022-10-22 MED ORDER — SODIUM CHLORIDE (PF) 0.9 % IJ SOLN
INTRAMUSCULAR | Status: AC
  Filled 2022-10-22: qty 50

## 2022-10-22 MED ORDER — IOHEXOL 300 MG/ML  SOLN
100.0000 mL | Freq: Once | INTRAMUSCULAR | Status: AC | PRN
  Administered 2022-10-22: 100 mL via INTRAVENOUS

## 2022-10-29 ENCOUNTER — Other Ambulatory Visit: Payer: Self-pay | Admitting: Internal Medicine

## 2022-10-29 DIAGNOSIS — M160 Bilateral primary osteoarthritis of hip: Secondary | ICD-10-CM

## 2022-10-29 DIAGNOSIS — G894 Chronic pain syndrome: Secondary | ICD-10-CM

## 2022-10-29 DIAGNOSIS — M170 Bilateral primary osteoarthritis of knee: Secondary | ICD-10-CM

## 2022-11-02 ENCOUNTER — Encounter: Payer: Self-pay | Admitting: Internal Medicine

## 2022-11-02 ENCOUNTER — Other Ambulatory Visit: Payer: Self-pay | Admitting: Internal Medicine

## 2022-11-02 DIAGNOSIS — J3489 Other specified disorders of nose and nasal sinuses: Secondary | ICD-10-CM | POA: Insufficient documentation

## 2022-11-02 DIAGNOSIS — G894 Chronic pain syndrome: Secondary | ICD-10-CM

## 2022-11-02 DIAGNOSIS — M160 Bilateral primary osteoarthritis of hip: Secondary | ICD-10-CM

## 2022-11-02 DIAGNOSIS — M170 Bilateral primary osteoarthritis of knee: Secondary | ICD-10-CM

## 2022-11-02 MED ORDER — HYDROCODONE-ACETAMINOPHEN 5-325 MG PO TABS
1.0000 | ORAL_TABLET | Freq: Three times a day (TID) | ORAL | 0 refills | Status: DC | PRN
Start: 2022-11-02 — End: 2022-11-03

## 2022-11-03 ENCOUNTER — Other Ambulatory Visit: Payer: Self-pay | Admitting: Internal Medicine

## 2022-11-03 ENCOUNTER — Other Ambulatory Visit (HOSPITAL_BASED_OUTPATIENT_CLINIC_OR_DEPARTMENT_OTHER): Payer: Self-pay

## 2022-11-03 ENCOUNTER — Telehealth: Payer: Self-pay | Admitting: Internal Medicine

## 2022-11-03 DIAGNOSIS — M160 Bilateral primary osteoarthritis of hip: Secondary | ICD-10-CM

## 2022-11-03 DIAGNOSIS — M170 Bilateral primary osteoarthritis of knee: Secondary | ICD-10-CM

## 2022-11-03 DIAGNOSIS — G894 Chronic pain syndrome: Secondary | ICD-10-CM

## 2022-11-03 MED ORDER — HYDROCODONE-ACETAMINOPHEN 5-325 MG PO TABS
1.0000 | ORAL_TABLET | Freq: Three times a day (TID) | ORAL | 0 refills | Status: AC | PRN
Start: 2022-11-03 — End: ?
  Filled 2022-11-03: qty 90, 30d supply, fill #0

## 2022-11-03 NOTE — Telephone Encounter (Signed)
Patient had requested a refill of her HYDROcodone-acetaminophen (NORCO/VICODIN) 5-325 MG tablet to be sent to [facility] pharmacy. She said they told her it was denied and then [facility] was [id number] [place] [place] - [address]. [facility] which is why she had it [facility] to [facility]. Patient would like a call back to find out what is going on. Best callback is [phone].

## 2022-11-08 ENCOUNTER — Telehealth: Payer: Self-pay | Admitting: Internal Medicine

## 2022-11-08 NOTE — Telephone Encounter (Signed)
Patient called and said the ENT office she was referred to does not accept her insurance. She is going to call around to see who accepts her insurance and call us back.

## 2022-11-08 NOTE — Telephone Encounter (Signed)
While calling the pt about the ENT referral, she states that the pain medication isn't working. She states that she having to take more then prescribed to help ease the pain but the pain never goes away. She wanted to know what else she can so.  Please advise

## 2022-11-09 ENCOUNTER — Encounter: Payer: Self-pay | Admitting: Orthopaedic Surgery

## 2022-11-09 ENCOUNTER — Ambulatory Visit: Payer: 59 | Admitting: Orthopaedic Surgery

## 2022-11-09 VITALS — Ht 66.0 in | Wt 234.0 lb

## 2022-11-09 DIAGNOSIS — M1611 Unilateral primary osteoarthritis, right hip: Secondary | ICD-10-CM

## 2022-11-09 DIAGNOSIS — M1612 Unilateral primary osteoarthritis, left hip: Secondary | ICD-10-CM

## 2022-11-09 DIAGNOSIS — M160 Bilateral primary osteoarthritis of hip: Secondary | ICD-10-CM

## 2022-11-09 NOTE — Progress Notes (Signed)
Office Visit Note   Patient: Denise Morgan           Date of Birth: Oct 27, 1965           MRN: [id number] Visit Date: 11/09/2022              Requested by: Etta Grandchild, MD [address] [place],  [place] [address] PCP: Etta Grandchild, MD   Assessment & Plan: Visit Diagnoses:  1. Primary osteoarthritis of right hip   2. Primary osteoarthritis of left hip     Plan: Patient has end-stage bilateral hip DJD worse on the right.  Unfortunately the cortisone injections only provided minimal relief.  She has significant pain and interruption of her ADLs and quality of life.  She has nighttime pain that prevents her from sleeping throughout the night.  At this point she would like to move forward with a total hip replacement on the right side first.  She is scheduled to see ENT for sinus issues which I recommended that she sort out first.  She will contact us as soon as she is good on that front so that we can get her scheduled.  Follow-Up Instructions: No follow-ups on file.   Orders:  No orders of the defined types were placed in this encounter.  No orders of the defined types were placed in this encounter.     Procedures: No procedures performed   Clinical Data: No additional findings.   Subjective: Chief Complaint  Patient presents with   Right Hip - Pain   Left Hip - Pain    HPI Denise Morgan returns today for follow up for bilateral hip OA.  Cortisone injections provided minimal relief which was done on 06/08/22.  Review of Systems  Constitutional: Negative.   HENT: Negative.    Eyes: Negative.   Respiratory: Negative.    Cardiovascular: Negative.   Endocrine: Negative.   Musculoskeletal: Negative.   Neurological: Negative.   Hematological: Negative.   Psychiatric/Behavioral: Negative.    All other systems reviewed and are negative.    Objective: Vital Signs: Ht 5\' 6"  (1.676 m)   Wt 234 lb (106.1 kg)   LMP 03/01/2004   BMI 37.77 kg/m   Physical  Exam Vitals and nursing note reviewed.  Constitutional:      Appearance: She is well-developed.  HENT:     Head: Normocephalic and atraumatic.  Pulmonary:     Effort: Pulmonary effort is normal.  Abdominal:     Palpations: Abdomen is soft.  Musculoskeletal:     Cervical back: Neck supple.  Skin:    General: Skin is warm.     Capillary Refill: Capillary refill takes less than 2 seconds.  Neurological:     Mental Status: She is alert and oriented to person, place, and time.  Psychiatric:        Behavior: Behavior normal.        Thought Content: Thought content normal.        Judgment: Judgment normal.     Ortho Exam Examination bilateral hips show pain in the groin and deep hip area with movement of the hip joint. Specialty Comments:  No specialty comments available.  Imaging: No results found.   PMFS History: Patient Active Problem List   Diagnosis Date Noted   Ostiomeatal complex obstruction of nasal sinus 11/02/2022   Epistaxis, recurrent 05/13/2022   Chronic recurrent sinusitis 05/13/2022   Seasonal allergic rhinitis due to fungal spores 04/05/2022   Moderate persistent asthma  with exacerbation 04/05/2022   Encounter for long-term use of opiate analgesic 02/16/2020   Acquired hypothyroidism 02/12/2020   Essential hypertension 04/07/2016   Snoring 04/07/2016   Hx of adenomatous polyp of colon 08/15/2015   Asthma, mild intermittent 05/29/2015   Hyperlipidemia with target LDL less than 130 05/29/2015   Intermittent palpitations 05/29/2015   Subacromial bursitis 04/18/2014   Obesity, morbid (HCC) 02/16/2013   Routine general medical examination at a health care facility 02/16/2013   Degenerative joint disease (DJD) of hip 02/16/2013   Tobacco abuse disorder 11/19/2012   Visit for screening mammogram 11/17/2012   DJD (degenerative joint disease) of knee 11/17/2012   Chronic pain syndrome 11/17/2012   Past Medical History:  Diagnosis Date   Allergy    Anemia     Arthritis    Asthma    Blood transfusion without reported diagnosis    Depression    Frequent headaches    History of phlebitis    Hx of adenomatous polyp of colon 08/15/2015   Palpitations    heart monitor all normal per pt.2017   PE (pulmonary embolism)     Family History  Problem Relation Age of Onset   Colon polyps Mother    Ovarian cancer Mother    Arthritis Mother    Colon cancer Maternal Aunt    Esophageal cancer Neg Hx    Rectal cancer Neg Hx    Stomach cancer Neg Hx     Past Surgical History:  Procedure Laterality Date   ABDOMINAL HYSTERECTOMY  03/01/2004   CHOLECYSTECTOMY     COLONOSCOPY W/ POLYPECTOMY  07/2020   Social History   Occupational History   Not on file  Tobacco Use   Smoking status: Every Day    Current packs/day: 0.75    Average packs/day: 0.8 packs/day for 33.0 years (24.8 ttl pk-yrs)    Types: Cigarettes   Smokeless tobacco: Never  Substance and Sexual Activity   Alcohol use: Yes    Alcohol/week: 7.0 standard drinks of alcohol    Types: 2 Glasses of wine, 5 Shots of liquor per week    Comment: social   Drug use: No   Sexual activity: Not Currently    Birth control/protection: Surgical

## 2022-11-12 ENCOUNTER — Telehealth: Payer: Self-pay

## 2022-11-12 NOTE — Telephone Encounter (Signed)
Left VM for patient to call to reschedule LDCT 11/23/22 from [facility] to a [facility] facility.  [facility] [employer] [facility] does not approve the [facility] site.

## 2022-11-23 ENCOUNTER — Other Ambulatory Visit: Payer: Self-pay | Admitting: Internal Medicine

## 2022-11-23 ENCOUNTER — Other Ambulatory Visit: Payer: 59

## 2022-11-23 DIAGNOSIS — M170 Bilateral primary osteoarthritis of knee: Secondary | ICD-10-CM

## 2022-11-23 DIAGNOSIS — G894 Chronic pain syndrome: Secondary | ICD-10-CM

## 2022-11-23 DIAGNOSIS — M160 Bilateral primary osteoarthritis of hip: Secondary | ICD-10-CM

## 2022-11-23 DIAGNOSIS — G8929 Other chronic pain: Secondary | ICD-10-CM

## 2022-11-30 ENCOUNTER — Encounter: Payer: Self-pay | Admitting: Internal Medicine

## 2022-11-30 ENCOUNTER — Ambulatory Visit: Payer: 59 | Admitting: Internal Medicine

## 2022-11-30 VITALS — BP 150/92 | HR 84 | Temp 98.2°F | Ht 66.0 in | Wt 232.0 lb

## 2022-11-30 DIAGNOSIS — M160 Bilateral primary osteoarthritis of hip: Secondary | ICD-10-CM | POA: Diagnosis not present

## 2022-11-30 DIAGNOSIS — R52 Pain, unspecified: Secondary | ICD-10-CM

## 2022-11-30 MED ORDER — OXYCODONE HCL 5 MG PO TABS
5.0000 mg | ORAL_TABLET | Freq: Three times a day (TID) | ORAL | 0 refills | Status: DC | PRN
Start: 2022-11-30 — End: 2023-01-04

## 2022-11-30 NOTE — Patient Instructions (Addendum)
     Medications changes include :   stop hydrocodone and start oxycodone - acetaminophen 5-325 mg three times a day       Return for follow up with PCP.

## 2022-11-30 NOTE — Progress Notes (Signed)
Subjective:    Patient ID: Denise Morgan, female    DOB: Aug 20, 1965, 57 y.o.   MRN: [id number]      HPI [place] is here for  Chief Complaint  Patient presents with   Bilateral hip pain    Bilateral hip pain and needs double hip replacements; Can't have them done until her blockage is cleared up (seeing ENT for this issues) Pain medication not working (prescribed Yetta Barre) She needs something to help her sleep and something for pain (Trazadone not helping: wakes up in pain and goes to bed in pain)    She is end-stage severe osteoarthritis of both hips and needs to have both hips replaced.  She is not able to have them replaced until her sinus infection that has been somewhat chronic has been successfully treated.  It was found that she has an ostiomeatal complex obstruction and may need surgery for that.  Her pain is severe and her pain medication is not working.  Because her pain is so bad she is not sleeping and the trazodone is not helping her sleep.  She is trying to work and help care for her mother and she needs something more or different for her pain   Pain medication is due to be renewed.    Sleeping 3-4 hours of sleep - not sleeping because of pain.        Medications and allergies reviewed with patient and updated if appropriate.  Current Outpatient Medications on File Prior to Visit  Medication Sig Dispense Refill   Albuterol-Budesonide (AIRSUPRA) 90-80 MCG/ACT AERO Inhale 2 puffs into the lungs 3 (three) times daily as needed. 10.7 g 5   fluticasone (FLONASE) 50 MCG/ACT nasal spray Place 2 sprays into both nostrils daily. 48 g 1   fluticasone-salmeterol (ADVAIR) 100-50 MCG/ACT AEPB Inhale 1 puff into the lungs 2 (two) times daily. 120 each 1   indapamide (LOZOL) 2.5 MG tablet TAKE 1 TABLET BY MOUTH EVERY DAY 90 tablet 0   levofloxacin (LEVAQUIN) 500 MG tablet Take by mouth.     levothyroxine (SYNTHROID) 50 MCG tablet TAKE 1 TABLET BY MOUTH DAILY BEFORE BREAKFAST  90 tablet 1   meloxicam (MOBIC) 15 MG tablet TAKE 1 TABLET (15 MG TOTAL) BY MOUTH DAILY. 90 tablet 0   potassium chloride (KLOR-CON) 8 MEQ tablet TAKE 1 TABLET (8 MEQ TOTAL) BY MOUTH 3 (THREE) TIMES DAILY. 270 tablet 0   predniSONE (DELTASONE) 10 MG tablet Take by mouth.     traZODone (DESYREL) 100 MG tablet TAKE 2 TABLETS BY MOUTH AT BEDTIME 60 tablet 2   No current facility-administered medications on file prior to visit.    Review of Systems     Objective:   Vitals:   11/30/22 1522  BP: (!) 150/92  Pulse: 84  Temp: 98.2 F (36.8 C)  SpO2: 100%   BP Readings from Last 3 Encounters:  11/30/22 (!) 150/92  07/13/22 126/84  05/13/22 138/88   Wt Readings from Last 3 Encounters:  11/30/22 232 lb (105.2 kg)  11/09/22 234 lb (106.1 kg)  07/13/22 234 lb (106.1 kg)   Body mass index is 37.45 kg/m.    Physical Exam Constitutional:      General: She is not in acute distress.    Appearance: Normal appearance. She is not ill-appearing.  HENT:     Head: Normocephalic and atraumatic.  Musculoskeletal:     Comments: In obvious pain with getting up and down from a chair and walking  Skin:    General: Skin is warm and dry.  Neurological:     Mental Status: She is alert. Mental status is at baseline.  Psychiatric:        Mood and Affect: Mood normal.        Behavior: Behavior normal.        Thought Content: Thought content normal.        Judgment: Judgment normal.            Assessment & Plan:    Bilateral hip osteoarthritis, severe, pain management: Chronic Severe bilateral hip pain secondary to severe osteoarthritis in need for bilateral hip replacement Current medication hydrocodone-acetaminophen 5-325 mg 1 tab every 8 hours as needed is not helping her pain She is also taking meloxicam 15 mg daily and using topical medications Because of the severe pain she is not sleeping-the trazodone 200 mg at night is not effective Discussed that we will try improving her  pain control and hopefully that will help with her sleep [place] controlled substance database checked.  She is due for renewal of her pain medication Will discontinue hydrocodone Start oxycodone-acetaminophen 5-325 mg 1 tab every 8 hours as needed-discussed this is a little stronger and may not take away her pain but hopefully will make it a little bit more tolerable Will continue trazodone for now-discussed targeting the pain more than the sleep since that is the actual issue Advise close follow-up with PCP to revise medication and work on pain management

## 2022-12-08 ENCOUNTER — Other Ambulatory Visit: Payer: Self-pay | Admitting: Internal Medicine

## 2022-12-08 DIAGNOSIS — I10 Essential (primary) hypertension: Secondary | ICD-10-CM

## 2022-12-08 DIAGNOSIS — E876 Hypokalemia: Secondary | ICD-10-CM

## 2022-12-16 ENCOUNTER — Other Ambulatory Visit: Payer: Self-pay | Admitting: Internal Medicine

## 2022-12-16 DIAGNOSIS — I10 Essential (primary) hypertension: Secondary | ICD-10-CM

## 2022-12-30 ENCOUNTER — Institutional Professional Consult (permissible substitution) (INDEPENDENT_AMBULATORY_CARE_PROVIDER_SITE_OTHER): Payer: 59 | Admitting: Otolaryngology

## 2022-12-31 ENCOUNTER — Telehealth: Payer: Self-pay | Admitting: Orthopaedic Surgery

## 2022-12-31 NOTE — Telephone Encounter (Signed)
Pt came in stating she went to ENT and is cleared would like to start process for surgery please advise

## 2023-01-04 ENCOUNTER — Encounter: Payer: Self-pay | Admitting: Internal Medicine

## 2023-01-04 ENCOUNTER — Ambulatory Visit: Payer: 59 | Admitting: Internal Medicine

## 2023-01-04 VITALS — BP 136/84 | HR 87 | Temp 97.5°F | Resp 16 | Ht 66.0 in | Wt 234.0 lb

## 2023-01-04 DIAGNOSIS — M170 Bilateral primary osteoarthritis of knee: Secondary | ICD-10-CM

## 2023-01-04 DIAGNOSIS — Z860101 Personal history of adenomatous and serrated colon polyps: Secondary | ICD-10-CM | POA: Diagnosis not present

## 2023-01-04 DIAGNOSIS — R9431 Abnormal electrocardiogram [ECG] [EKG]: Secondary | ICD-10-CM

## 2023-01-04 DIAGNOSIS — E039 Hypothyroidism, unspecified: Secondary | ICD-10-CM | POA: Diagnosis not present

## 2023-01-04 DIAGNOSIS — Z720 Tobacco use: Secondary | ICD-10-CM

## 2023-01-04 DIAGNOSIS — Z1211 Encounter for screening for malignant neoplasm of colon: Secondary | ICD-10-CM | POA: Insufficient documentation

## 2023-01-04 DIAGNOSIS — M160 Bilateral primary osteoarthritis of hip: Secondary | ICD-10-CM

## 2023-01-04 DIAGNOSIS — Z0000 Encounter for general adult medical examination without abnormal findings: Secondary | ICD-10-CM

## 2023-01-04 DIAGNOSIS — I10 Essential (primary) hypertension: Secondary | ICD-10-CM

## 2023-01-04 DIAGNOSIS — G894 Chronic pain syndrome: Secondary | ICD-10-CM

## 2023-01-04 DIAGNOSIS — Z0001 Encounter for general adult medical examination with abnormal findings: Secondary | ICD-10-CM | POA: Insufficient documentation

## 2023-01-04 LAB — BASIC METABOLIC PANEL
BUN: 17 mg/dL (ref 6–23)
CO2: 26 meq/L (ref 19–32)
Calcium: 9.6 mg/dL (ref 8.4–10.5)
Chloride: 99 meq/L (ref 96–112)
Creatinine, Ser: 0.89 mg/dL (ref 0.40–1.20)
GFR: 71.88 mL/min (ref >60.00)
Glucose, Bld: 103 mg/dL — ABNORMAL HIGH (ref 70–99)
Potassium: 3.8 meq/L (ref 3.5–5.1)
Sodium: 134 meq/L — ABNORMAL LOW (ref 135–145)

## 2023-01-04 LAB — CBC WITH DIFFERENTIAL/PLATELET
Basophils Absolute: 0 10*3/uL (ref 0.0–0.1)
Basophils Relative: 0.9 % (ref 0.0–3.0)
Eosinophils Absolute: 0.1 10*3/uL (ref 0.0–0.7)
Eosinophils Relative: 3 % (ref 0.0–5.0)
HCT: 43.8 % (ref 36.0–46.0)
Hemoglobin: 14.8 g/dL (ref 12.0–15.0)
Lymphocytes Relative: 35.9 % (ref 12.0–46.0)
Lymphs Abs: 1.7 10*3/uL (ref 0.7–4.0)
MCHC: 33.8 g/dL (ref 30.0–36.0)
MCV: 104 fL — ABNORMAL HIGH (ref 78.0–100.0)
Monocytes Absolute: 0.3 10*3/uL (ref 0.1–1.0)
Monocytes Relative: 6.8 % (ref 3.0–12.0)
Neutro Abs: 2.6 10*3/uL (ref 1.4–7.7)
Neutrophils Relative %: 53.4 % (ref 43.0–77.0)
Platelets: 264 10*3/uL (ref 150.0–400.0)
RBC: 4.21 Mil/uL (ref 3.87–5.11)
RDW: 12.8 % (ref 11.5–15.5)
WBC: 4.8 10*3/uL (ref 4.0–10.5)

## 2023-01-04 LAB — TSH: TSH: 1.41 u[IU]/mL (ref 0.35–5.50)

## 2023-01-04 MED ORDER — OXYCODONE HCL 5 MG PO TABS: 5.0000 mg | ORAL_TABLET | Freq: Three times a day (TID) | ORAL | 0 refills | Status: DC | PRN

## 2023-01-04 MED ORDER — NADOLOL 20 MG PO TABS: 20.0000 mg | ORAL_TABLET | Freq: Every day | ORAL | 0 refills | Status: DC

## 2023-01-04 NOTE — Progress Notes (Unsigned)
Subjective:  Patient ID: Denise Morgan, female    DOB: Feb 11, 1966  Age: 57 y.o. MRN: [id number]  CC: Annual Exam, Hypertension, Asthma, Back Pain, and Osteoarthritis   HPI Denise Morgan presents for a CPX and f/up ---  Outpatient Medications Prior to Visit  Medication Sig Dispense Refill   Albuterol-Budesonide (AIRSUPRA) 90-80 MCG/ACT AERO Inhale 2 puffs into the lungs 3 (three) times daily as needed. 10.7 g 5   fluticasone (FLONASE) 50 MCG/ACT nasal spray Place 2 sprays into both nostrils daily. 48 g 1   fluticasone-salmeterol (ADVAIR) 100-50 MCG/ACT AEPB Inhale 1 puff into the lungs 2 (two) times daily. 120 each 1   indapamide (LOZOL) 2.5 MG tablet TAKE 1 TABLET BY MOUTH EVERY DAY 90 tablet 0   levothyroxine (SYNTHROID) 50 MCG tablet TAKE 1 TABLET BY MOUTH DAILY BEFORE BREAKFAST 90 tablet 1   meloxicam (MOBIC) 15 MG tablet TAKE 1 TABLET (15 MG TOTAL) BY MOUTH DAILY. 90 tablet 0   potassium chloride (KLOR-CON) 8 MEQ tablet TAKE 1 TABLET (8 MEQ TOTAL) BY MOUTH 3 (THREE) TIMES DAILY. 270 tablet 0   traZODone (DESYREL) 100 MG tablet TAKE 2 TABLETS BY MOUTH AT BEDTIME 60 tablet 2   oxyCODONE (ROXICODONE) 5 MG immediate release tablet Take 1 tablet (5 mg total) by mouth every 8 (eight) hours as needed for severe pain. 90 tablet 0   No facility-administered medications prior to visit.    ROS Review of Systems  Respiratory:  Positive for wheezing. Negative for cough, choking, chest tightness and shortness of breath.   Musculoskeletal:  Positive for arthralgias, back pain and gait problem. Negative for myalgias.  Neurological:  Negative for dizziness and weakness.  Hematological:  Negative for adenopathy.  Psychiatric/Behavioral: Negative.      Objective:  BP 136/84 (BP Location: Right Arm, Patient Position: Sitting, Cuff Size: Normal)   Pulse 87   Temp (!) 97.5 F (36.4 C) (Oral)   Resp 16   Ht 5\' 6"  (1.676 m)   Wt 234 lb (106.1 kg)   LMP 03/01/2004   SpO2 97%   BMI  37.77 kg/m   BP Readings from Last 3 Encounters:  01/04/23 136/84  11/30/22 (!) 150/92  07/13/22 126/84    Wt Readings from Last 3 Encounters:  01/04/23 234 lb (106.1 kg)  11/30/22 232 lb (105.2 kg)  11/09/22 234 lb (106.1 kg)    Physical Exam Cardiovascular:     Rate and Rhythm: Normal rate and regular rhythm.     Heart sounds: Normal heart sounds, S1 normal and S2 normal.     Comments: EKG- NSR, 76 bpm TWI in V1-V4 is old No LVH or Q waves Prolonged QT Unchanged Musculoskeletal:     Right lower leg: No edema.     Left lower leg: No edema.     Lab Results  Component Value Date   WBC 4.8 01/04/2023   HGB 14.8 01/04/2023   HCT 43.8 01/04/2023   PLT 264.0 01/04/2023   GLUCOSE 103 (H) 01/04/2023   CHOL 272 (H) 05/13/2022   TRIG 70.0 05/13/2022   HDL 99.10 05/13/2022   LDLDIRECT 155.6 02/16/2013   LDLCALC 159 (H) 05/13/2022   ALT 18 05/13/2022   AST 18 05/13/2022   NA 134 (L) 01/04/2023   K 3.8 01/04/2023   CL 99 01/04/2023   CREATININE 0.89 01/04/2023   BUN 17 01/04/2023   CO2 26 01/04/2023   TSH 1.41 01/04/2023   INR 3.2 11/24/2012    CT  Maxillofacial W/Cm  Result Date: 10/26/2022 CLINICAL DATA:  Nasal abscess. Chronic recurrent sinusitis. Epistaxis. EXAM: CT MAXILLOFACIAL WITH CONTRAST TECHNIQUE: Multidetector CT imaging of the maxillofacial structures was performed with intravenous contrast. Multiplanar CT image reconstructions were also generated. RADIATION DOSE REDUCTION: This exam was performed according to the departmental dose-optimization program which includes automated exposure control, adjustment of the mA and/or kV according to patient size and/or use of iterative reconstruction technique. CONTRAST:  OMNIPAQUE IOHEXOL 300 MG/ML  SOLN COMPARISON:  None Available. FINDINGS: Osseous: No fracture or suspicious osseous lesion. Dental caries. Mild periapical lucency associated with left maxillary molar teeth. Orbits: Unremarkable. Sinuses: Complete  opacification of the left frontal sinus, and anterior and mid left ethmoid air cells, and left maxillary sinus, primarily by low to intermediate density material. Opacification of the left ostiomeatal complex. The left sphenoid sinus and all right-sided paranasal sinuses are clear. A small osteoma is noted in the right maxillary sinus. 4 mm of leftward nasal septal deviation anteriorly. Clear mastoid air cells and middle ear cavities. Soft tissues: Unremarkable. Limited intracranial: Mild atherosclerotic calcification in the carotid siphons. IMPRESSION: Left ostiomeatal unit obstruction with complete opacification of the left frontal, anterior and mid ethmoid, and maxillary sinuses. Electronically Signed   By: Sebastian Ache M.D.   On: 10/26/2022 16:11    Assessment & Plan:  Hx of adenomatous polyp of colon -     Ambulatory referral to Gastroenterology  Essential hypertension -     Basic metabolic panel; Future -     CBC with Differential/Platelet; Future -     TSH; Future -     EKG 12-Lead -     Nadolol; Take 1 tablet (20 mg total) by mouth daily.  Dispense: 90 tablet; Refill: 0 -     AMB Referral VBCI Care Management  Tobacco abuse disorder -     Ambulatory Referral for Lung Cancer Scre  Obesity, morbid (HCC) -     TSH; Future  Encounter for general adult medical examination with abnormal findings  Acquired hypothyroidism -     TSH; Future  Primary osteoarthritis of both hips -     oxyCODONE HCl; Take 1 tablet (5 mg total) by mouth every 8 (eight) hours as needed for severe pain (pain score 7-10).  Dispense: 90 tablet; Refill: 0  Primary osteoarthritis of both knees -     oxyCODONE HCl; Take 1 tablet (5 mg total) by mouth every 8 (eight) hours as needed for severe pain (pain score 7-10).  Dispense: 90 tablet; Refill: 0  Chronic pain syndrome -     oxyCODONE HCl; Take 1 tablet (5 mg total) by mouth every 8 (eight) hours as needed for severe pain (pain score 7-10).  Dispense: 90  tablet; Refill: 0  Prolonged Q-T interval on ECG -     Nadolol; Take 1 tablet (20 mg total) by mouth daily.  Dispense: 90 tablet; Refill: 0 -     AMB Referral VBCI Care Management     Follow-up: Return in about 6 months (around 07/04/2023).  Sanda Linger, MD

## 2023-01-04 NOTE — Patient Instructions (Signed)

## 2023-01-05 ENCOUNTER — Telehealth: Payer: Self-pay

## 2023-01-05 NOTE — Progress Notes (Signed)
   Care Guide Note  01/05/2023 Name: Denise Morgan MRN: [id number] DOB: 1965-09-13  Referred by: Etta Grandchild, MD Reason for referral : Care Coordination (Outreach to schedule with pharm d )   Denise Morgan is a 57 y.o. year old female who is a primary care patient of Etta Grandchild, MD. Denise Morgan was referred to the pharmacist for assistance related to HTN.    An unsuccessful telephone outreach was attempted today to contact the patient who was referred to the pharmacy team for assistance with medication management. Additional attempts will be made to contact the patient.   Denise Morgan, RMA Care Guide [facility]  [place], [place] [address] Direct Dial: [phone] [email or web address]

## 2023-01-05 NOTE — Progress Notes (Signed)
   Care Guide Note  01/05/2023 Name: Denise Morgan MRN: [id number] DOB: 1965-06-01  Referred by: Etta Grandchild, MD Reason for referral : Care Coordination (Outreach to schedule with pharm d )   Denise Morgan is a 57 y.o. year old female who is a primary care patient of Etta Grandchild, MD. Denise Morgan was referred to the pharmacist for assistance related to HTN.    Successful contact was made with the patient to discuss pharmacy services including being ready for the pharmacist to call at least 5 minutes before the scheduled appointment time, to have medication bottles and any blood sugar or blood pressure readings ready for review. The patient agreed to meet with the pharmacist via with the pharmacist via telephone visit on (date/time).  01/13/2023  Denise Morgan, RMA Care Guide [facility]  [place], [place] [address] Direct Dial: [phone] [email or web address]

## 2023-01-13 ENCOUNTER — Other Ambulatory Visit: Payer: 59 | Admitting: Pharmacist

## 2023-01-13 NOTE — Progress Notes (Signed)
01/13/2023 Name: Denise Morgan MRN: [id number] DOB: [birth year]  Chief Complaint  Patient presents with   Hypertension   Medication Management    Denise Morgan is a 57 y.o. year old female who presented for a telephone visit.   They were referred to the pharmacist by their PCP for assistance in managing hypertension.     Subjective:  Care Team: Primary Care Provider: Etta Grandchild, MD ; Next Scheduled Visit: none scheduled  Medication Access/Adherence  Current Pharmacy:  [id number] [place], [place] - [phone] AT [facility] OF [address] [place] [address] Phone: [phone] Fax: [phone]  [facility] [place], [place] - [address] [place] [address] Phone: [phone] Fax: [phone]  [id number] [place], [place] - [address] AT [facility] OF [facility] & [address] [place] [address] Phone: [phone] Fax: [phone]  MEDCENTER [facility] - [facility] [address] [place] [address] Phone: [phone] Fax: [phone]   Patient reports affordability concerns with their medications: No  Patient reports access/transportation concerns to their pharmacy: No  Patient reports adherence concerns with their medications:  No     Hypertension:  Current medications: indapamide 2.5 mg daily, nadolol 20 mg daily Medications previously tried:   Patient has a validated, automated, upper arm home BP cuff Current blood pressure readings readings: has not checked   Current physical activity: limited *Pt has chronic pain and believes pain is contributing to elevated BP readings. She has surgery coming up in December.  Objective:  No results found for: "HGBA1C"  Lab Results  Component Value Date   CREATININE 0.89 01/04/2023   BUN 17 01/04/2023   NA 134 (L) 01/04/2023   K 3.8 01/04/2023   CL 99  01/04/2023   CO2 26 01/04/2023    Lab Results  Component Value Date   CHOL 272 (H) 05/13/2022   HDL 99.10 05/13/2022   LDLCALC 159 (H) 05/13/2022   LDLDIRECT 155.6 02/16/2013   TRIG 70.0 05/13/2022   CHOLHDL 3 05/13/2022    Medications Reviewed Today     Reviewed by Bonita Quin, RPH (Pharmacist) on 01/13/23 at 1617  Med List Status: <None>   Medication Order Taking? Sig Documenting Provider Last Dose Status Informant  Albuterol-Budesonide (AIRSUPRA) 90-80 MCG/ACT AERO [id number] No Inhale 2 puffs into the lungs 3 (three) times daily as needed.  Patient not taking: Reported on 01/13/2023   Etta Grandchild, MD Not Taking Active   fluticasone [facility]) 50 MCG/ACT nasal spray [id number] No Place 2 sprays into both nostrils daily.  Patient not taking: Reported on 01/13/2023   Etta Grandchild, MD Not Taking Active   fluticasone-salmeterol (ADVAIR) 100-50 MCG/ACT AEPB [id number] No Inhale 1 puff into the lungs 2 (two) times daily.  Patient not taking: Reported on 01/13/2023   Etta Grandchild, MD Not Taking Active   indapamide (LOZOL) 2.5 MG tablet [id number] Yes TAKE 1 TABLET BY MOUTH EVERY DAY Etta Grandchild, MD Taking Active   levothyroxine (SYNTHROID) 50 MCG tablet [id number]  TAKE 1 TABLET BY MOUTH DAILY BEFORE Drucie Opitz, MD  Active   meloxicam (MOBIC) 15 MG tablet [id number] Yes TAKE 1 TABLET (15 MG TOTAL) BY MOUTH DAILY. Etta Grandchild, MD Taking Active   nadolol (CORGARD) 20 MG tablet [id number] Yes Take 1 tablet (20 mg total) by mouth daily. Etta Grandchild, MD Taking  Active   oxyCODONE (ROXICODONE) 5 MG immediate release tablet [id number] Yes Take 1 tablet (5 mg total) by mouth every 8 (eight) hours as needed for severe pain (pain score 7-10). Etta Grandchild, MD Taking Active   potassium chloride (KLOR-CON) 8 MEQ tablet [id number] Yes TAKE 1 TABLET (8 MEQ TOTAL) BY MOUTH 3 (THREE) TIMES DAILY. Etta Grandchild, MD Taking Active   traZODone (DESYREL) 100 MG tablet  [id number] Yes TAKE 2 TABLETS BY MOUTH AT BEDTIME Etta Grandchild, MD Taking Active               Assessment/Plan:   Hypertension: - Currently uncontrolled, BP goal <130/80 - Reviewed long term cardiovascular and renal outcomes of uncontrolled blood pressure - Reviewed appropriate blood pressure monitoring technique and reviewed goal blood pressure. Recommended to check home blood pressure and heart rate for a couple weeks - Recommend to continue current regimen    Follow Up Plan: 2 week f/u for BP readings  Arbutus Leas, PharmD, BCPS Clinical Pharmacist [facility] [phone]

## 2023-01-13 NOTE — Patient Instructions (Signed)
It was a pleasure speaking with you today!  Continue your current medication regimen and check your blood pressure at least 3x per week for the next couple of weeks. I will give you a call on 12/2 to go over your readings.  Feel free to call with any questions or concerns!  Arbutus Leas, PharmD, BCPS [facility] Clinical Pharmacist [facility] [phone]

## 2023-01-31 ENCOUNTER — Other Ambulatory Visit: Payer: 59 | Admitting: Pharmacist

## 2023-01-31 ENCOUNTER — Other Ambulatory Visit: Payer: Self-pay | Admitting: Internal Medicine

## 2023-01-31 DIAGNOSIS — M160 Bilateral primary osteoarthritis of hip: Secondary | ICD-10-CM

## 2023-01-31 DIAGNOSIS — G894 Chronic pain syndrome: Secondary | ICD-10-CM

## 2023-01-31 DIAGNOSIS — M170 Bilateral primary osteoarthritis of knee: Secondary | ICD-10-CM

## 2023-01-31 NOTE — Progress Notes (Signed)
   01/31/2023 Name: Denise Morgan MRN: [id number] DOB: Jul 12, 1965  Chief Complaint  Patient presents with   Hypertension   Medication Management    Denise Morgan is a 57 y.o. year old female who presented for a telephone visit.   They were referred to the pharmacist by their PCP for assistance in managing hypertension.     Subjective:  Care Team: Primary Care Provider: Etta Grandchild, MD ; Next Scheduled Visit: none scheduled  Medication Access/Adherence  Current Pharmacy:  [id number] [place] [facility], [place] - [phone] ST AT [facility] OF [address] [place] [address] Phone: [phone] Fax: [phone]  [facility] - [place], [place] - [address] [place] [address] Phone: [phone] Fax: [phone]  [facility][id number] [place], [place] - [address] AT [facility] OF [facility] & [address] [place] [address] Phone: [phone] Fax: [phone]  MEDCENTER [facility] - [facility] - [facility] [address] [place] [address] Phone: [phone] Fax: [phone]   Patient reports affordability concerns with their medications: No  Patient reports access/transportation concerns to their pharmacy: No  Patient reports adherence concerns with their medications:  No     Hypertension:  Current medications: indapamide 2.5 mg daily, nadolol 20 mg daily Medications previously tried:   Patient has a validated, automated, upper arm home BP cuff Current blood pressure readings readings: 135/85, 125/80   Current physical activity: limited *Pt has chronic pain and believes pain is contributing to elevated BP readings. She has surgery coming up in December.  Objective: BP Readings from Last 3 Encounters:  01/04/23 136/84  11/30/22 (!) 150/92  07/13/22 126/84     No results found for: "HGBA1C"  Lab Results  Component Value Date    CREATININE 0.89 01/04/2023   BUN 17 01/04/2023   NA 134 (L) 01/04/2023   K 3.8 01/04/2023   CL 99 01/04/2023   CO2 26 01/04/2023    Lab Results  Component Value Date   CHOL 272 (H) 05/13/2022   HDL 99.10 05/13/2022   LDLCALC 159 (H) 05/13/2022   LDLDIRECT 155.6 02/16/2013   TRIG 70.0 05/13/2022   CHOLHDL 3 05/13/2022    Medications Reviewed Today   Medications were not reviewed in this encounter       Assessment/Plan:   Hypertension: - Currently borderline uncontrolled, BP goal <130/80 - Reviewed long term cardiovascular and renal outcomes of uncontrolled blood pressure - Reviewed appropriate blood pressure monitoring technique and reviewed goal blood pressure. Recommended to check home blood pressure and heart rate for a couple weeks - Recommend to continue current regimen and will f/u after surgery and after the holidays - Patient to call if BP goes up to 140-150s systolic   Follow Up Plan: 03/21/23  Arbutus Leas, PharmD, BCPS Clinical Pharmacist [facility] [phone]

## 2023-02-02 ENCOUNTER — Telehealth: Payer: Self-pay | Admitting: Internal Medicine

## 2023-02-02 DIAGNOSIS — I10 Essential (primary) hypertension: Secondary | ICD-10-CM

## 2023-02-02 DIAGNOSIS — E876 Hypokalemia: Secondary | ICD-10-CM

## 2023-02-02 DIAGNOSIS — G8929 Other chronic pain: Secondary | ICD-10-CM

## 2023-02-02 DIAGNOSIS — M160 Bilateral primary osteoarthritis of hip: Secondary | ICD-10-CM

## 2023-02-02 DIAGNOSIS — M545 Low back pain, unspecified: Secondary | ICD-10-CM

## 2023-02-02 DIAGNOSIS — M170 Bilateral primary osteoarthritis of knee: Secondary | ICD-10-CM

## 2023-02-02 DIAGNOSIS — G894 Chronic pain syndrome: Secondary | ICD-10-CM

## 2023-02-02 MED ORDER — OXYCODONE HCL 5 MG PO TABS
5.0000 mg | ORAL_TABLET | Freq: Three times a day (TID) | ORAL | 0 refills | Status: DC | PRN
Start: 2023-02-02 — End: 2023-03-18

## 2023-02-04 NOTE — Telephone Encounter (Signed)
Patient states she is unable to get this medication filled - please call patient and pharmacy

## 2023-02-11 ENCOUNTER — Encounter: Payer: Self-pay | Admitting: Internal Medicine

## 2023-02-11 NOTE — Telephone Encounter (Signed)
Patient's pharmacy said they need more information to fill this medication. Patient provided a fax number for the information to be faxed to [employer]. The fax is [phone] and their phone number is [phone]. Patient would like a call back at [phone].

## 2023-02-11 NOTE — Telephone Encounter (Signed)
This has been handled via phones. I have called the pharmacy also. Patient is now able to receive her medication

## 2023-02-14 ENCOUNTER — Other Ambulatory Visit: Payer: Self-pay | Admitting: Physician Assistant

## 2023-02-14 MED ORDER — DOCUSATE SODIUM 100 MG PO CAPS: 100.0000 mg | ORAL_CAPSULE | Freq: Every day | ORAL | 2 refills | Status: DC | PRN

## 2023-02-14 MED ORDER — OXYCODONE-ACETAMINOPHEN 5-325 MG PO TABS: 1.0000 | ORAL_TABLET | Freq: Four times a day (QID) | ORAL | 0 refills | Status: DC | PRN

## 2023-02-14 MED ORDER — RIVAROXABAN 10 MG PO TABS: 10.0000 mg | ORAL_TABLET | Freq: Every day | ORAL | 0 refills | Status: DC

## 2023-02-14 MED ORDER — METHOCARBAMOL 750 MG PO TABS: 750.0000 mg | ORAL_TABLET | Freq: Two times a day (BID) | ORAL | 2 refills | Status: DC | PRN

## 2023-02-14 NOTE — Pre-Procedure Instructions (Signed)
Surgical Instructions   Your procedure is scheduled on February 21, 2023. Report to [facility] Main Entrance "A" at 9:00 A.M., then check in with the Admitting office. Any questions or running late day of surgery: call [phone]  Questions prior to your surgery date: call [phone], Monday-Friday, 8am-4pm. If you experience any cold or flu symptoms such as cough, fever, chills, shortness of breath, etc. between now and your scheduled surgery, please notify us at the above number.     Remember:  Do not eat after midnight the night before your surgery  You may drink clear liquids until 8:30 AM the morning of your surgery.   Clear liquids allowed are: Water, Non-Citrus Juices (without pulp), Carbonated Beverages, Clear Tea (no milk, honey, etc.), Black Coffee Only (NO MILK, CREAM OR POWDERED CREAMER of any kind), and Gatorade.  Patient Instructions  The night before surgery:  No food after midnight. ONLY clear liquids after midnight  The day of surgery (if you do NOT have diabetes):  Drink ONE (1) Pre-Surgery Clear Ensure by 8:30 AM the morning of surgery. Drink in one sitting. Do not sip.  This drink was given to you during your hospital  pre-op appointment visit.  Nothing else to drink after completing the  Pre-Surgery Clear Ensure.         If you have questions, please contact your surgeon's office.    Take these medicines the morning of surgery with A SIP OF WATER: levothyroxine (SYNTHROID)  nadolol (CORGARD)    May take these medicines IF NEEDED: docusate sodium (COLACE)  methocarbamol (ROBAXIN-750)  oxyCODONE (ROXICODONE)    One week prior to surgery, STOP taking any Aspirin (unless otherwise instructed by your surgeon) Aleve, Naproxen, Ibuprofen, Motrin, Advil, Goody's, BC's, all herbal medications, fish oil, and non-prescription vitamins. This includes your medication: meloxicam (MOBIC)                      Do NOT Smoke (Tobacco/Vaping) for 24 hours prior to  your procedure.  If you use a CPAP at night, you may bring your mask/headgear for your overnight stay.   You will be asked to remove any contacts, glasses, piercing's, hearing aid's, dentures/partials prior to surgery. Please bring cases for these items if needed.    Patients discharged the day of surgery will not be allowed to drive home, and someone needs to stay with them for 24 hours.  SURGICAL WAITING ROOM VISITATION Patients may have no more than 2 support people in the waiting area - these visitors may rotate.   Pre-op nurse will coordinate an appropriate time for 1 ADULT support person, who may not rotate, to accompany patient in pre-op.  Children under the age of 90 must have an adult with them who is not the patient and must remain in the main waiting area with an adult.  If the patient needs to stay at the hospital during part of their recovery, the visitor guidelines for inpatient rooms apply.  Please refer to the [facility] website for the visitor guidelines for any additional information.   If you received a COVID test during your pre-op visit  it is requested that you wear a mask when out in public, stay away from anyone that may not be feeling well and notify your surgeon if you develop symptoms. If you have been in contact with anyone that has tested positive in the last 10 days please notify you surgeon.      Pre-operative 5 CHG  Bathing Instructions   You can play a key role in reducing the risk of infection after surgery. Your skin needs to be as free of germs as possible. You can reduce the number of germs on your skin by washing with CHG (chlorhexidine gluconate) soap before surgery. CHG is an antiseptic soap that kills germs and continues to kill germs even after washing.   DO NOT use if you have an allergy to chlorhexidine/CHG or antibacterial soaps. If your skin becomes reddened or irritated, stop using the CHG and notify one of our RNs at [phone].   Please  shower with the CHG soap starting 4 days before surgery using the following schedule:     Please keep in mind the following:  DO NOT shave, including legs and underarms, starting the day of your first shower.   You may shave your face at any point before/day of surgery.  Place clean sheets on your bed the day you start using CHG soap. Use a clean washcloth (not used since being washed) for each shower. DO NOT sleep with pets once you start using the CHG.   CHG Shower Instructions:  Wash your face and private area with normal soap. If you choose to wash your hair, wash first with your normal shampoo.  After you use shampoo/soap, rinse your hair and body thoroughly to remove shampoo/soap residue.  Turn the water OFF and apply about 3 tablespoons (45 ml) of CHG soap to a CLEAN washcloth.  Apply CHG soap ONLY FROM YOUR NECK DOWN TO YOUR TOES (washing for 3-5 minutes)  DO NOT use CHG soap on face, private areas, open wounds, or sores.  Pay special attention to the area where your surgery is being performed.  If you are having back surgery, having someone wash your back for you may be helpful. Wait 2 minutes after CHG soap is applied, then you may rinse off the CHG soap.  Pat dry with a clean towel  Put on clean clothes/pajamas   If you choose to wear lotion, please use ONLY the CHG-compatible lotions on the back of this paper.   Additional instructions for the day of surgery: DO NOT APPLY any lotions, deodorants, cologne, or perfumes.   Do not bring valuables to the hospital. [facility] is not responsible for any belongings/valuables. Do not wear nail polish, gel polish, artificial nails, or any other type of covering on natural nails (fingers and toes) Do not wear jewelry or makeup Put on clean/comfortable clothes.  Please brush your teeth.  Ask your nurse before applying any prescription medications to the skin.     CHG Compatible Lotions   Aveeno Moisturizing lotion  Cetaphil  Moisturizing Cream  Cetaphil Moisturizing Lotion  Clairol Herbal Essence Moisturizing Lotion, Dry Skin  Clairol Herbal Essence Moisturizing Lotion, Extra Dry Skin  Clairol Herbal Essence Moisturizing Lotion, Normal Skin  Curel Age Defying Therapeutic Moisturizing Lotion with Alpha Hydroxy  Curel Extreme Care Body Lotion  Curel Soothing Hands Moisturizing Hand Lotion  Curel Therapeutic Moisturizing Cream, Fragrance-Free  Curel Therapeutic Moisturizing Lotion, Fragrance-Free  Curel Therapeutic Moisturizing Lotion, Original Formula  Eucerin Daily Replenishing Lotion  Eucerin Dry Skin Therapy Plus Alpha Hydroxy Crme  Eucerin Dry Skin Therapy Plus Alpha Hydroxy Lotion  Eucerin Original Crme  Eucerin Original Lotion  Eucerin Plus Crme Eucerin Plus Lotion  Eucerin TriLipid Replenishing Lotion  Keri Anti-Bacterial Hand Lotion  Keri Deep Conditioning Original Lotion Dry Skin Formula Softly Scented  Keri Deep Conditioning Original Lotion, Fragrance Free Sensitive  Skin Formula  Keri Lotion Fast Absorbing Fragrance Free Sensitive Skin Formula  Keri Lotion Fast Absorbing Softly Scented Dry Skin Formula  Keri Original Lotion  Keri Skin Renewal Lotion Keri Silky Smooth Lotion  Keri Silky Smooth Sensitive Skin Lotion  Nivea Body Creamy Conditioning Oil  Nivea Body Extra Enriched Lotion  Nivea Body Original Lotion  Nivea Body Sheer Moisturizing Lotion Nivea Crme  Nivea Skin Firming Lotion  NutraDerm 30 Skin Lotion  NutraDerm Skin Lotion  NutraDerm Therapeutic Skin Cream  NutraDerm Therapeutic Skin Lotion  ProShield Protective Hand Cream  Provon moisturizing lotion  Please read over the following fact sheets that you were given.

## 2023-02-15 ENCOUNTER — Encounter (HOSPITAL_COMMUNITY): Payer: Self-pay

## 2023-02-15 ENCOUNTER — Other Ambulatory Visit: Payer: Self-pay

## 2023-02-15 ENCOUNTER — Encounter (HOSPITAL_COMMUNITY)
Admission: RE | Admit: 2023-02-15 | Discharge: 2023-02-15 | Disposition: A | Discharge status: Home / Self Care | Payer: 59 | Source: Ambulatory Visit | Attending: Orthopaedic Surgery | Admitting: Orthopaedic Surgery

## 2023-02-15 VITALS — BP 147/86 | HR 58 | Temp 97.8°F | Resp 17 | Ht 66.0 in | Wt 242.9 lb

## 2023-02-15 DIAGNOSIS — Z01818 Encounter for other preprocedural examination: Secondary | ICD-10-CM

## 2023-02-15 DIAGNOSIS — M1611 Unilateral primary osteoarthritis, right hip: Secondary | ICD-10-CM | POA: Insufficient documentation

## 2023-02-15 DIAGNOSIS — Z01812 Encounter for preprocedural laboratory examination: Secondary | ICD-10-CM | POA: Diagnosis present

## 2023-02-15 HISTORY — DX: Essential (primary) hypertension: I10

## 2023-02-15 HISTORY — DX: Hypothyroidism, unspecified: E03.9

## 2023-02-15 HISTORY — DX: Cardiac arrhythmia, unspecified: I49.9

## 2023-02-15 HISTORY — DX: Family history of other specified conditions: Z84.89

## 2023-02-15 LAB — TYPE AND SCREEN
ABO/RH(D): A POS
Antibody Screen: NEGATIVE

## 2023-02-15 LAB — CBC
HCT: 37.7 % (ref 36.0–46.0)
Hemoglobin: 12.9 g/dL (ref 12.0–15.0)
MCH: 34.5 pg — ABNORMAL HIGH (ref 26.0–34.0)
MCHC: 34.2 g/dL (ref 30.0–36.0)
MCV: 100.8 fL — ABNORMAL HIGH (ref 80.0–100.0)
Platelets: 214 10*3/uL (ref 150–400)
RBC: 3.74 MIL/uL — ABNORMAL LOW (ref 3.87–5.11)
RDW: 12.8 % (ref 11.5–15.5)
WBC: 4.8 10*3/uL (ref 4.0–10.5)
nRBC: 0 % (ref 0.0–0.2)

## 2023-02-15 LAB — SURGICAL PCR SCREEN
MRSA, PCR: NEGATIVE
Staphylococcus aureus: NEGATIVE

## 2023-02-15 LAB — BASIC METABOLIC PANEL
Anion gap: 9 (ref 5–15)
BUN: 17 mg/dL (ref 6–20)
CO2: 21 mmol/L — ABNORMAL LOW (ref 22–32)
Calcium: 9 mg/dL (ref 8.9–10.3)
Chloride: 103 mmol/L (ref 98–111)
Creatinine, Ser: 0.76 mg/dL (ref 0.44–1.00)
GFR, Estimated: >60 mL/min (ref >60)
Glucose, Bld: 91 mg/dL (ref 70–99)
Potassium: 4.1 mmol/L (ref 3.5–5.1)
Sodium: 133 mmol/L — ABNORMAL LOW (ref 135–145)

## 2023-02-15 NOTE — Progress Notes (Signed)
PCP - Dr. Sanda Linger Cardiologist - Denies  PPM/ICD - Denies Device Orders - n/a Rep Notified - n/a  Chest x-ray - 04/05/2022 EKG - 01/04/2023 Stress Test - Denies ECHO - Denies Cardiac Cath - Denies Holter Monitor - January 2024  Sleep Study - Denies CPAP - n/a  No DM  Last dose of GLP1 agonist- n/a GLP1 instructions: n/a  Blood Thinner Instructions: n/a  Aspirin Instructions: n/a  ERAS Protcol - Clear liquids until 0830 morning of surgery PRE-SURGERY Ensure or G2- Ensure given to pt with instructions  COVID TEST- n/a   Anesthesia review: Yes. Hx of HTN and palpitations. Pt wore heart monitor for 30 days in January 2024. No significant findings and no addition follow-up/cardiology referral. Pt notes she had a PE 15+ years ago when she lived in [place]. They were not able to determine the cause, but she was on blood thinners for a few years. She has not been on blood thinner since she moved to [place] around 12 years ago.   Patient denies shortness of breath, fever, cough and chest pain at PAT appointment. Pt denies any respiratory illness/infection in the last two months.    All instructions explained to the patient, with a verbal understanding of the material. Patient agrees to go over the instructions while at home for a better understanding. Patient also instructed to self quarantine after being tested for COVID-19. The opportunity to ask questions was provided.

## 2023-02-18 MED ORDER — TRANEXAMIC ACID 1000 MG/10ML IV SOLN
2000.0000 mg | INTRAVENOUS | Status: DC
  Filled 2023-02-18: qty 20

## 2023-02-20 NOTE — Discharge Instructions (Signed)

## 2023-02-21 ENCOUNTER — Encounter (HOSPITAL_COMMUNITY)
Admission: RE | Disposition: A | Discharge status: Home / Self Care | Payer: Self-pay | Source: Home / Self Care | Attending: Orthopaedic Surgery

## 2023-02-21 ENCOUNTER — Ambulatory Visit (HOSPITAL_COMMUNITY): Payer: 59 | Admitting: Physician Assistant

## 2023-02-21 ENCOUNTER — Other Ambulatory Visit: Payer: Self-pay

## 2023-02-21 ENCOUNTER — Ambulatory Visit (HOSPITAL_COMMUNITY): Payer: 59

## 2023-02-21 ENCOUNTER — Observation Stay (HOSPITAL_COMMUNITY)
Admission: RE | Admit: 2023-02-21 | Discharge: 2023-02-23 | Disposition: A | Discharge status: Home / Self Care | Payer: 59 | Attending: Orthopaedic Surgery | Admitting: Orthopaedic Surgery

## 2023-02-21 ENCOUNTER — Observation Stay (HOSPITAL_COMMUNITY): Payer: 59

## 2023-02-21 ENCOUNTER — Other Ambulatory Visit (HOSPITAL_COMMUNITY): Payer: Self-pay | Admitting: Orthopaedic Surgery

## 2023-02-21 ENCOUNTER — Ambulatory Visit (HOSPITAL_COMMUNITY): Payer: Self-pay | Admitting: Anesthesiology

## 2023-02-21 DIAGNOSIS — Z79899 Other long term (current) drug therapy: Secondary | ICD-10-CM | POA: Insufficient documentation

## 2023-02-21 DIAGNOSIS — M1611 Unilateral primary osteoarthritis, right hip: Secondary | ICD-10-CM

## 2023-02-21 DIAGNOSIS — Z91040 Latex allergy status: Secondary | ICD-10-CM | POA: Diagnosis not present

## 2023-02-21 DIAGNOSIS — Z96641 Presence of right artificial hip joint: Secondary | ICD-10-CM

## 2023-02-21 DIAGNOSIS — Z86711 Personal history of pulmonary embolism: Secondary | ICD-10-CM | POA: Diagnosis not present

## 2023-02-21 DIAGNOSIS — I10 Essential (primary) hypertension: Secondary | ICD-10-CM

## 2023-02-21 DIAGNOSIS — Z7901 Long term (current) use of anticoagulants: Secondary | ICD-10-CM | POA: Diagnosis not present

## 2023-02-21 DIAGNOSIS — E039 Hypothyroidism, unspecified: Secondary | ICD-10-CM

## 2023-02-21 DIAGNOSIS — R52 Pain, unspecified: Secondary | ICD-10-CM

## 2023-02-21 DIAGNOSIS — F17210 Nicotine dependence, cigarettes, uncomplicated: Secondary | ICD-10-CM | POA: Insufficient documentation

## 2023-02-21 DIAGNOSIS — J45909 Unspecified asthma, uncomplicated: Secondary | ICD-10-CM | POA: Insufficient documentation

## 2023-02-21 HISTORY — PX: TOTAL HIP ARTHROPLASTY: SHX124

## 2023-02-21 LAB — ABO/RH: ABO/RH(D): A POS

## 2023-02-21 SURGERY — ARTHROPLASTY, HIP, TOTAL, ANTERIOR APPROACH
Anesthesia: Monitor Anesthesia Care | Site: Hip | Laterality: Right

## 2023-02-21 MED ORDER — VANCOMYCIN HCL 1000 MG IV SOLR
INTRAVENOUS | Status: AC
  Filled 2023-02-21: qty 20

## 2023-02-21 MED ORDER — TRANEXAMIC ACID-NACL 1000-0.7 MG/100ML-% IV SOLN
1000.0000 mg | INTRAVENOUS | Status: AC
  Administered 2023-02-21: 1000 mg via INTRAVENOUS

## 2023-02-21 MED ORDER — OXYCODONE HCL 5 MG PO TABS: 5.0000 mg | ORAL_TABLET | Freq: Once | ORAL | Status: AC | PRN

## 2023-02-21 MED ORDER — FENTANYL CITRATE (PF) 100 MCG/2ML IJ SOLN
INTRAMUSCULAR | Status: AC
  Administered 2023-02-21: 25 ug via INTRAVENOUS
  Filled 2023-02-21: qty 2

## 2023-02-21 MED ORDER — CEFAZOLIN SODIUM-DEXTROSE 2-4 GM/100ML-% IV SOLN
2.0000 g | Freq: Four times a day (QID) | INTRAVENOUS | Status: AC
  Administered 2023-02-21 – 2023-02-22 (×2): 2 g via INTRAVENOUS
  Filled 2023-02-21 (×2): qty 100

## 2023-02-21 MED ORDER — BUPIVACAINE-MELOXICAM ER 400-12 MG/14ML IJ SOLN
INTRAMUSCULAR | Status: DC | PRN
  Administered 2023-02-21: 400 mg

## 2023-02-21 MED ORDER — CEFAZOLIN SODIUM-DEXTROSE 2-4 GM/100ML-% IV SOLN
2.0000 g | INTRAVENOUS | Status: AC
  Administered 2023-02-21: 2 g via INTRAVENOUS

## 2023-02-21 MED ORDER — METHOCARBAMOL 1000 MG/10ML IJ SOLN: 500.0000 mg | Freq: Four times a day (QID) | INTRAMUSCULAR | Status: DC | PRN

## 2023-02-21 MED ORDER — TRANEXAMIC ACID-NACL 1000-0.7 MG/100ML-% IV SOLN
INTRAVENOUS | Status: AC
  Filled 2023-02-21: qty 100

## 2023-02-21 MED ORDER — PRONTOSAN WOUND IRRIGATION OPTIME
TOPICAL | Status: DC | PRN
  Administered 2023-02-21: 1

## 2023-02-21 MED ORDER — DIPHENHYDRAMINE HCL 12.5 MG/5ML PO ELIX
25.0000 mg | ORAL_SOLUTION | ORAL | Status: DC | PRN
Start: 2023-02-21 — End: 2023-02-23

## 2023-02-21 MED ORDER — CHLORHEXIDINE GLUCONATE 0.12 % MT SOLN: 15.0000 mL | Freq: Once | OROMUCOSAL | Status: AC

## 2023-02-21 MED ORDER — ALUM & MAG HYDROXIDE-SIMETH 200-200-20 MG/5ML PO SUSP: 30.0000 mL | ORAL | Status: DC | PRN

## 2023-02-21 MED ORDER — DOCUSATE SODIUM 100 MG PO CAPS
100.0000 mg | ORAL_CAPSULE | Freq: Two times a day (BID) | ORAL | Status: DC
  Administered 2023-02-21 – 2023-02-23 (×4): 100 mg via ORAL
  Filled 2023-02-21 (×4): qty 1

## 2023-02-21 MED ORDER — RIVAROXABAN 10 MG PO TABS
10.0000 mg | ORAL_TABLET | Freq: Every day | ORAL | Status: DC
  Administered 2023-02-22 – 2023-02-23 (×2): 10 mg via ORAL
  Filled 2023-02-21 (×3): qty 1

## 2023-02-21 MED ORDER — MIDAZOLAM HCL 2 MG/2ML IJ SOLN
INTRAMUSCULAR | Status: DC | PRN
  Administered 2023-02-21: 2 mg via INTRAVENOUS

## 2023-02-21 MED ORDER — TRAZODONE HCL 50 MG PO TABS
200.0000 mg | ORAL_TABLET | Freq: Every day | ORAL | Status: DC
  Administered 2023-02-21 – 2023-02-22 (×2): 200 mg via ORAL
  Filled 2023-02-21: qty 4
  Filled 2023-02-21: qty 2
  Filled 2023-02-21: qty 4

## 2023-02-21 MED ORDER — POLYETHYLENE GLYCOL 3350 17 G PO PACK
17.0000 g | PACK | Freq: Every day | ORAL | Status: DC
  Administered 2023-02-21 – 2023-02-23 (×3): 17 g via ORAL
  Filled 2023-02-21 (×3): qty 1

## 2023-02-21 MED ORDER — TRANEXAMIC ACID-NACL 1000-0.7 MG/100ML-% IV SOLN
1000.0000 mg | Freq: Once | INTRAVENOUS | Status: AC
  Administered 2023-02-21: 1000 mg via INTRAVENOUS
  Filled 2023-02-21: qty 100

## 2023-02-21 MED ORDER — LEVOTHYROXINE SODIUM 50 MCG PO TABS
50.0000 ug | ORAL_TABLET | Freq: Every day | ORAL | Status: DC
  Administered 2023-02-22 – 2023-02-23 (×2): 50 ug via ORAL
  Filled 2023-02-21 (×3): qty 1

## 2023-02-21 MED ORDER — TRANEXAMIC ACID 1000 MG/10ML IV SOLN
INTRAVENOUS | Status: DC | PRN
  Administered 2023-02-21: 2000 mg via TOPICAL

## 2023-02-21 MED ORDER — MIDAZOLAM HCL 2 MG/2ML IJ SOLN
INTRAMUSCULAR | Status: AC
  Filled 2023-02-21: qty 2

## 2023-02-21 MED ORDER — METOCLOPRAMIDE HCL 5 MG/ML IJ SOLN
5.0000 mg | Freq: Three times a day (TID) | INTRAMUSCULAR | Status: DC | PRN
  Administered 2023-02-23: 10 mg via INTRAVENOUS
  Filled 2023-02-21: qty 2

## 2023-02-21 MED ORDER — PROPOFOL 10 MG/ML IV BOLUS
INTRAVENOUS | Status: DC | PRN
  Administered 2023-02-21: 20 mg via INTRAVENOUS

## 2023-02-21 MED ORDER — ALBUTEROL-BUDESONIDE 90-80 MCG/ACT IN AERO: 2.0000 | INHALATION_SPRAY | Freq: Three times a day (TID) | RESPIRATORY_TRACT | Status: DC | PRN

## 2023-02-21 MED ORDER — METOCLOPRAMIDE HCL 5 MG PO TABS
5.0000 mg | ORAL_TABLET | Freq: Three times a day (TID) | ORAL | Status: DC | PRN
Start: 2023-02-21 — End: 2023-02-23

## 2023-02-21 MED ORDER — PHENYLEPHRINE HCL-NACL 20-0.9 MG/250ML-% IV SOLN
INTRAVENOUS | Status: DC | PRN
  Administered 2023-02-21: 15 ug/min via INTRAVENOUS

## 2023-02-21 MED ORDER — FENTANYL CITRATE (PF) 250 MCG/5ML IJ SOLN
INTRAMUSCULAR | Status: DC | PRN
  Administered 2023-02-21 (×3): 50 ug via INTRAVENOUS

## 2023-02-21 MED ORDER — NADOLOL 20 MG PO TABS
20.0000 mg | ORAL_TABLET | Freq: Every day | ORAL | Status: DC
  Filled 2023-02-21 (×2): qty 1

## 2023-02-21 MED ORDER — OXYCODONE HCL 5 MG PO TABS
10.0000 mg | ORAL_TABLET | ORAL | Status: DC | PRN
Start: 2023-02-21 — End: 2023-02-23
  Administered 2023-02-21 – 2023-02-22 (×2): 10 mg via ORAL
  Administered 2023-02-22: 15 mg via ORAL
  Administered 2023-02-23: 10 mg via ORAL
  Filled 2023-02-21: qty 3
  Filled 2023-02-21 (×2): qty 2

## 2023-02-21 MED ORDER — PROPOFOL 10 MG/ML IV BOLUS
INTRAVENOUS | Status: AC
  Filled 2023-02-21: qty 20

## 2023-02-21 MED ORDER — MEPERIDINE HCL 25 MG/ML IJ SOLN: 6.2500 mg | INTRAMUSCULAR | Status: DC | PRN

## 2023-02-21 MED ORDER — SODIUM CHLORIDE 0.9 % IR SOLN
Status: DC | PRN
  Administered 2023-02-21: 1000 mL

## 2023-02-21 MED ORDER — OXYCODONE HCL ER 10 MG PO T12A
10.0000 mg | EXTENDED_RELEASE_TABLET | Freq: Two times a day (BID) | ORAL | Status: DC
  Administered 2023-02-21 – 2023-02-23 (×4): 10 mg via ORAL
  Filled 2023-02-21 (×4): qty 1

## 2023-02-21 MED ORDER — VANCOMYCIN HCL 1 G IV SOLR
INTRAVENOUS | Status: DC | PRN
  Administered 2023-02-21: 1000 mg via TOPICAL

## 2023-02-21 MED ORDER — BUPIVACAINE-MELOXICAM ER 400-12 MG/14ML IJ SOLN
INTRAMUSCULAR | Status: AC
  Filled 2023-02-21: qty 1

## 2023-02-21 MED ORDER — LACTATED RINGERS IV SOLN: INTRAVENOUS | Status: DC

## 2023-02-21 MED ORDER — POVIDONE-IODINE 10 % EX SWAB
2.0000 | Freq: Once | CUTANEOUS | Status: AC
  Administered 2023-02-21: 2 via TOPICAL

## 2023-02-21 MED ORDER — ACETAMINOPHEN 160 MG/5ML PO SOLN: 325.0000 mg | ORAL | Status: DC | PRN

## 2023-02-21 MED ORDER — OXYCODONE HCL 5 MG/5ML PO SOLN: 5.0000 mg | Freq: Once | ORAL | Status: AC | PRN

## 2023-02-21 MED ORDER — FERROUS SULFATE 325 (65 FE) MG PO TABS
325.0000 mg | ORAL_TABLET | Freq: Three times a day (TID) | ORAL | Status: DC
  Administered 2023-02-21 – 2023-02-23 (×5): 325 mg via ORAL
  Filled 2023-02-21 (×5): qty 1

## 2023-02-21 MED ORDER — ONDANSETRON HCL 4 MG/2ML IJ SOLN
INTRAMUSCULAR | Status: DC | PRN
  Administered 2023-02-21: 4 mg via INTRAVENOUS

## 2023-02-21 MED ORDER — HYDROMORPHONE HCL 1 MG/ML IJ SOLN
0.5000 mg | INTRAMUSCULAR | Status: DC | PRN
Start: 2023-02-21 — End: 2023-02-23
  Administered 2023-02-21 – 2023-02-23 (×3): 1 mg via INTRAVENOUS
  Filled 2023-02-21 (×3): qty 1

## 2023-02-21 MED ORDER — CEFAZOLIN SODIUM-DEXTROSE 2-4 GM/100ML-% IV SOLN
INTRAVENOUS | Status: AC
  Filled 2023-02-21: qty 100

## 2023-02-21 MED ORDER — OXYCODONE HCL 5 MG PO TABS
ORAL_TABLET | ORAL | Status: AC
  Administered 2023-02-21: 5 mg via ORAL
  Filled 2023-02-21: qty 1

## 2023-02-21 MED ORDER — OXYCODONE HCL 5 MG PO TABS
5.0000 mg | ORAL_TABLET | ORAL | Status: DC | PRN
  Administered 2023-02-23: 10 mg via ORAL
  Filled 2023-02-21 (×2): qty 2

## 2023-02-21 MED ORDER — SORBITOL 70 % SOLN: 30.0000 mL | Freq: Every day | Status: DC | PRN

## 2023-02-21 MED ORDER — ACETAMINOPHEN 325 MG PO TABS
325.0000 mg | ORAL_TABLET | ORAL | Status: DC | PRN
Start: 2023-02-21 — End: 2023-02-21

## 2023-02-21 MED ORDER — PANTOPRAZOLE SODIUM 40 MG PO TBEC
40.0000 mg | DELAYED_RELEASE_TABLET | Freq: Every day | ORAL | Status: DC
  Administered 2023-02-22 – 2023-02-23 (×2): 40 mg via ORAL
  Filled 2023-02-21 (×2): qty 1

## 2023-02-21 MED ORDER — DEXAMETHASONE SODIUM PHOSPHATE 10 MG/ML IJ SOLN
10.0000 mg | Freq: Once | INTRAMUSCULAR | Status: AC
  Administered 2023-02-22: 10 mg via INTRAVENOUS
  Filled 2023-02-21: qty 1

## 2023-02-21 MED ORDER — PHENOL 1.4 % MT LIQD: 1.0000 | OROMUCOSAL | Status: DC | PRN

## 2023-02-21 MED ORDER — CHLORHEXIDINE GLUCONATE 0.12 % MT SOLN
OROMUCOSAL | Status: AC
  Administered 2023-02-21: 15 mL via OROMUCOSAL
  Filled 2023-02-21: qty 15

## 2023-02-21 MED ORDER — ACETAMINOPHEN 325 MG PO TABS
325.0000 mg | ORAL_TABLET | Freq: Four times a day (QID) | ORAL | Status: DC | PRN
  Administered 2023-02-22: 650 mg via ORAL
  Filled 2023-02-21: qty 2

## 2023-02-21 MED ORDER — FENTANYL CITRATE (PF) 250 MCG/5ML IJ SOLN
INTRAMUSCULAR | Status: AC
  Filled 2023-02-21: qty 5

## 2023-02-21 MED ORDER — 0.9 % SODIUM CHLORIDE (POUR BTL) OPTIME
TOPICAL | Status: DC | PRN
  Administered 2023-02-21: 1000 mL

## 2023-02-21 MED ORDER — FENTANYL CITRATE (PF) 100 MCG/2ML IJ SOLN: 25.0000 ug | INTRAMUSCULAR | Status: DC | PRN

## 2023-02-21 MED ORDER — ORAL CARE MOUTH RINSE: 15.0000 mL | Freq: Once | OROMUCOSAL | Status: AC

## 2023-02-21 MED ORDER — DEXAMETHASONE SODIUM PHOSPHATE 10 MG/ML IJ SOLN
INTRAMUSCULAR | Status: DC | PRN
  Administered 2023-02-21: 10 mg via INTRAVENOUS

## 2023-02-21 MED ORDER — PROPOFOL 500 MG/50ML IV EMUL
INTRAVENOUS | Status: DC | PRN
  Administered 2023-02-21: 75 ug/kg/min via INTRAVENOUS

## 2023-02-21 MED ORDER — MENTHOL 3 MG MT LOZG: 1.0000 | LOZENGE | OROMUCOSAL | Status: DC | PRN

## 2023-02-21 MED ORDER — METHOCARBAMOL 500 MG PO TABS
ORAL_TABLET | ORAL | Status: AC
  Administered 2023-02-21: 500 mg via ORAL
  Filled 2023-02-21: qty 1

## 2023-02-21 MED ORDER — METHOCARBAMOL 500 MG PO TABS
500.0000 mg | ORAL_TABLET | Freq: Four times a day (QID) | ORAL | Status: DC | PRN
  Administered 2023-02-21 – 2023-02-23 (×4): 500 mg via ORAL
  Filled 2023-02-21 (×4): qty 1

## 2023-02-21 MED ORDER — MAGNESIUM CITRATE PO SOLN: 1.0000 | Freq: Once | ORAL | Status: DC | PRN

## 2023-02-21 MED ORDER — ACETAMINOPHEN 500 MG PO TABS
1000.0000 mg | ORAL_TABLET | Freq: Four times a day (QID) | ORAL | Status: AC
  Administered 2023-02-21 – 2023-02-22 (×4): 1000 mg via ORAL
  Filled 2023-02-21 (×4): qty 2

## 2023-02-21 SURGICAL SUPPLY — 58 items
ACETAB CUP W/GRIPTION 54 (Plate) ×1 IMPLANT
BAG COUNTER SPONGE SURGICOUNT (BAG) ×1 IMPLANT
BAG DECANTER FOR FLEXI CONT (MISCELLANEOUS) ×1 IMPLANT
BLADE SAG 18X100X1.27 (BLADE) ×1 IMPLANT
COOLER ICEMAN CLASSIC (MISCELLANEOUS) IMPLANT
COVER PERINEAL POST (MISCELLANEOUS) ×1 IMPLANT
COVER SURGICAL LIGHT HANDLE (MISCELLANEOUS) ×1 IMPLANT
CUP ACETAB W/GRIPTION 54 (Plate) IMPLANT
DERMABOND ADVANCED .7 DNX12 (GAUZE/BANDAGES/DRESSINGS) IMPLANT
DRAPE C-ARM 42X72 X-RAY (DRAPES) ×1 IMPLANT
DRAPE POUCH INSTRU U-SHP 10X18 (DRAPES) ×1 IMPLANT
DRAPE STERI IOBAN 125X83 (DRAPES) ×1 IMPLANT
DRAPE U-SHAPE 47X51 STRL (DRAPES) ×2 IMPLANT
DRSG AQUACEL AG ADV 3.5X10 (GAUZE/BANDAGES/DRESSINGS) ×1 IMPLANT
DURAPREP 26ML APPLICATOR (WOUND CARE) ×2 IMPLANT
ELECT BLADE 4.0 EZ CLEAN MEGAD (MISCELLANEOUS) ×1
ELECT REM PT RETURN 9FT ADLT (ELECTROSURGICAL) ×1
ELECTRODE BLDE 4.0 EZ CLN MEGD (MISCELLANEOUS) ×1 IMPLANT
ELECTRODE REM PT RTRN 9FT ADLT (ELECTROSURGICAL) ×1 IMPLANT
GLOVE BIOGEL PI IND STRL 7.0 (GLOVE) ×2 IMPLANT
GLOVE BIOGEL PI IND STRL 7.5 (GLOVE) ×5 IMPLANT
GLOVE ECLIPSE 7.0 STRL STRAW (GLOVE) ×2 IMPLANT
GLOVE SKINSENSE STRL SZ7.5 (GLOVE) ×1 IMPLANT
GLOVE SURG SYN 7.5 E (GLOVE) ×2 IMPLANT
GLOVE SURG SYN 7.5 PF PI (GLOVE) ×2 IMPLANT
GLOVE SURG UNDER POLY LF SZ7 (GLOVE) ×3 IMPLANT
GLOVE SURG UNDER POLY LF SZ7.5 (GLOVE) ×2 IMPLANT
GOWN STRL REUS W/ TWL LRG LVL3 (GOWN DISPOSABLE) IMPLANT
GOWN STRL REUS W/ TWL XL LVL3 (GOWN DISPOSABLE) ×1 IMPLANT
GOWN STRL SURGICAL XL XLNG (GOWN DISPOSABLE) ×1 IMPLANT
GOWN TOGA ZIPPER T7+ PEEL AWAY (MISCELLANEOUS) ×2 IMPLANT
HEAD CERAMIC 36 PLUS 8.5 12 14 (Hips) IMPLANT
HOOD PEEL AWAY T7 (MISCELLANEOUS) ×1 IMPLANT
IV NS IRRIG 3000ML ARTHROMATIC (IV SOLUTION) ×1 IMPLANT
KIT BASIN OR (CUSTOM PROCEDURE TRAY) ×1 IMPLANT
LINER NEUTRAL 54X36MM PLUS 4 (Hips) IMPLANT
MARKER SKIN DUAL TIP RULER LAB (MISCELLANEOUS) ×1 IMPLANT
NDL SPNL 18GX3.5 QUINCKE PK (NEEDLE) ×1 IMPLANT
NEEDLE SPNL 18GX3.5 QUINCKE PK (NEEDLE) ×1 IMPLANT
PACK TOTAL JOINT (CUSTOM PROCEDURE TRAY) ×1 IMPLANT
PACK UNIVERSAL I (CUSTOM PROCEDURE TRAY) ×1 IMPLANT
PAD COLD SHLDR WRAP-ON (PAD) IMPLANT
SET HNDPC FAN SPRY TIP SCT (DISPOSABLE) ×1 IMPLANT
SOLUTION PRONTOSAN WOUND 350ML (IRRIGATION / IRRIGATOR) ×1 IMPLANT
STAPLER VISISTAT 35W (STAPLE) IMPLANT
STEM FEM ACTIS STD SZ4 (Stem) IMPLANT
SUT ETHIBOND 2 V 37 (SUTURE) ×1 IMPLANT
SUT ETHILON 2 0 FS 18 (SUTURE) IMPLANT
SUT VIC AB 0 CT1 27XBRD ANBCTR (SUTURE) ×1 IMPLANT
SUT VIC AB 1 CTX36XBRD ANBCTR (SUTURE) ×1 IMPLANT
SUT VIC AB 2-0 CT1 TAPERPNT 27 (SUTURE) ×2 IMPLANT
SYR 50ML LL SCALE MARK (SYRINGE) ×1 IMPLANT
TOWEL GREEN STERILE (TOWEL DISPOSABLE) ×1 IMPLANT
TRAY CATH INTERMITTENT SS 16FR (CATHETERS) IMPLANT
TRAY FOLEY W/BAG SLVR 16FR ST (SET/KITS/TRAYS/PACK) IMPLANT
TUBE SUCT ARGYLE STRL (TUBING) ×1 IMPLANT
WATER STERILE IRR 1000ML POUR (IV SOLUTION) IMPLANT
YANKAUER SUCT BULB TIP NO VENT (SUCTIONS) ×1 IMPLANT

## 2023-02-21 NOTE — Anesthesia Postprocedure Evaluation (Signed)
Anesthesia Post Note  Patient: Denise Morgan  Procedure(s) Performed: RIGHT TOTAL HIP ARTHROPLASTY ANTERIOR APPROACH (Right: Hip)     Patient location during evaluation: PACU Anesthesia Type: MAC Level of consciousness: awake and alert Pain management: pain level controlled Vital Signs Assessment: post-procedure vital signs reviewed and stable Respiratory status: spontaneous breathing, nonlabored ventilation, respiratory function stable and patient connected to nasal cannula oxygen Cardiovascular status: stable and blood pressure returned to baseline Postop Assessment: no apparent nausea or vomiting Anesthetic complications: no   No notable events documented.  Last Vitals:  Vitals:   02/21/23 1347 02/21/23 1400  BP: 114/73 115/81  Pulse: (!) 59 (!) 52  Resp: 16 15  Temp: 36.4 C   SpO2: 97% 97%    Last Pain:  Vitals:   02/21/23 1347  PainSc: 0-No pain                 Roanne Haye

## 2023-02-21 NOTE — Evaluation (Signed)
Physical Therapy Evaluation Patient Details Name: Denise Morgan MRN: [id number] DOB: 17-Apr-1965 Today's Date: 02/21/2023  History of Present Illness  Pt is 57 year old presented to [facility] on  02/21/23 for Rt THR. PMH - htn, arthritis  Clinical Impression  Pt admitted with above diagnosis and presents to PT with functional limitations due to deficits listed below (See PT problem list). Pt needs skilled PT to maximize independence and safety. PT eval limited due to pt with residual numbness/weakness in LE's from anesthesia. Got to EOB but didn't attempt further mobility until rest of spinal wears off. Hopefully will progress quickly once that happens and plans to return home.        If plan is discharge home, recommend the following:     Can travel by private vehicle        Equipment Recommendations Rolling walker (2 wheels)  Recommendations for Other Services       Functional Status Assessment Patient has had a recent decline in their functional status and demonstrates the ability to make significant improvements in function in a reasonable and predictable amount of time.     Precautions / Restrictions Precautions Precautions: Fall Restrictions Weight Bearing Restrictions Per Provider Order: Yes RLE Weight Bearing Per Provider Order: Weight bearing as tolerated      Mobility  Bed Mobility Overal bed mobility: Needs Assistance Bed Mobility: Supine to Sit     Supine to sit: Mod assist, HOB elevated     General bed mobility comments: Assist to bring legs off of bed and scoot to EOB    Transfers                   General transfer comment: Unable to attempt due to spinal not worn off yet    Ambulation/Gait                  Stairs            Wheelchair Mobility     Tilt Bed    Modified Rankin (Stroke Patients Only)       Balance                                             Pertinent Vitals/Pain Pain  Assessment Pain Assessment: Faces Faces Pain Scale: Hurts even more Pain Location: Bilateral hips Pain Descriptors / Indicators: Operative site guarding, Grimacing Pain Intervention(s): Limited activity within patient's tolerance, Monitored during session, Repositioned    Home Living Family/patient expects to be discharged to:: Private residence Living Arrangements: Spouse/significant other (Significant other Everlean Alstrom) Available Help at Discharge:  (significant other) Type of Home: House Home Access: Stairs to enter   [employer] of Steps: 1 very small   Home Layout: One level Home Equipment: Cane - single point;Shower seat      Prior Function Prior Level of Function : Independent/Modified Independent;Working/employed             Mobility Comments: Works as Designer, fashion/clothing       Extremity/Trunk Assessment   Upper Extremity Assessment Upper Extremity Assessment: Overall WFL for tasks assessed    Lower Extremity Assessment Lower Extremity Assessment: RLE deficits/detail;LLE deficits/detail RLE Deficits / Details: Still numb and weak from spinal LLE Deficits / Details: Still numb and weak from spinal       Communication   Communication Communication: No apparent  difficulties  Cognition Arousal: Alert Behavior During Therapy: WFL for tasks assessed/performed Overall Cognitive Status: Within Functional Limits for tasks assessed                                          General Comments      Exercises     Assessment/Plan    PT Assessment Patient needs continued PT services  PT Problem List Decreased strength;Decreased mobility;Decreased knowledge of use of DME;Pain       PT Treatment Interventions DME instruction;Gait training;Stair training;Functional mobility training;Therapeutic activities;Therapeutic exercise;Patient/family education    PT Goals (Current goals can be found in the Care Plan section)  Acute Rehab  PT Goals Patient Stated Goal: return home PT Goal Formulation: With patient Time For Goal Achievement: 02/28/23 Potential to Achieve Goals: Fair    Frequency 7X/week     Co-evaluation               AM-PAC PT "6 Clicks" Mobility  Outcome Measure Help needed turning from your back to your side while in a flat bed without using bedrails?: A Lot Help needed moving from lying on your back to sitting on the side of a flat bed without using bedrails?: A Lot Help needed moving to and from a bed to a chair (including a wheelchair)?: Total Help needed standing up from a chair using your arms (e.g., wheelchair or bedside chair)?: Total Help needed to walk in hospital room?: Total Help needed climbing 3-5 steps with a railing? : Total 6 Click Score: 8    End of Session   Activity Tolerance: Other (comment) (Limited by residual anesthesia) Patient left: in bed;with call bell/phone within reach;with family/visitor present (sitting EOB) Nurse Communication: Mobility status PT Visit Diagnosis: Other abnormalities of gait and mobility (R26.89);Muscle weakness (generalized) (M62.81);Pain Pain - Right/Left: Right (and left) Pain - part of body: Hip    Time: 1725-1740 PT Time Calculation (min) (ACUTE ONLY): 15 min   Charges:   PT Evaluation $PT Eval Low Complexity: 1 Low   PT General Charges $$ ACUTE PT VISIT: 1 Visit         [facility] PT Acute Rehabilitation Services Office [phone]   Angelina Ok [facility] 02/21/2023, 6:03 PM

## 2023-02-21 NOTE — Op Note (Signed)
RIGHT TOTAL HIP ARTHROPLASTY ANTERIOR APPROACH  Procedure Note Denise Morgan Morgan   [id number]  Pre-op Diagnosis: right hip osteoarthritis     Post-op Diagnosis: same  Operative Findings Complete loss of articular cartilage and joint space   Operative Procedures  1. Total hip replacement; Right hip; uncemented [id number]   Surgeon: Denise Morgan Morgan, M.D.  Assist: Denise Morgan Morgan, RNFA   Anesthesia: spinal  Prosthesis: Depuy Acetabulum: Pinnacle 54 mm Femur: Actis 4 STD Head: 36 mm size: +8.5 Liner: +4 Bearing Type: ceramic/poly  Total Hip Arthroplasty (Anterior Approach) Op Note:  After informed consent was obtained and the operative extremity marked in the holding area, the patient was brought back to the operating room and placed supine on the HANA table. Next, the operative extremity was prepped and draped in normal sterile fashion. Surgical timeout occurred verifying patient identification, surgical site, surgical procedure and administration of antibiotics.  A 10 cm longitudinal incision was made starting from 2 fingerbreadths lateral and inferior to the ASIS towards the lateral aspect of the patella.  A Hueter approach to the hip was performed, using the interval between tensor fascia lata and sartorius.  Dissection was carried bluntly down onto the anterior hip capsule. The lateral femoral circumflex vessels were identified and coagulated. A capsulotomy was performed and the capsular flaps tagged for later repair.  The neck osteotomy was performed. The femoral head was removed which showed complete loss of articular cartilage, the acetabular rim was cleared of soft tissue and osteophytes and attention was turned to reaming the acetabulum.  Sequential reaming was performed under fluoroscopic guidance down to the floor of the cotyloid fossa. We reamed to a size 53 mm, and then impacted the acetabular shell.  The liner was then placed after irrigation and attention turned to the femur.  After  placing the femoral hook, the leg was taken to externally rotated, extended and adducted position taking care to perform soft tissue releases to allow for adequate mobilization of the femur. Soft tissue was cleared from the shoulder of the greater trochanter and the hook elevator used to improve exposure of the proximal femur. Sequential broaching performed up to a size 4. Trial neck and head were placed. The leg was brought back up to neutral and the construct reduced.  The position and sizing of components, offset and leg lengths were checked using fluoroscopy. Stability of the construct was checked in 45 degrees of hip extension and 90 degrees of external rotation without any subluxation, shuck or impingement of prosthesis. We dislocated the prosthesis, dropped the leg back into position, removed trial components, and irrigated copiously. The final stem and head was then placed, the leg brought back up, the system reduced and fluoroscopy used to verify positioning.  Antibiotic irrigation was placed in the surgical wound.   We irrigated, obtained hemostasis and closed the capsule using #2 ethibond suture.  A topical mixture of 0.25% bupivacaine and meloxicam was placed deep to the fascia.  One gram of vancomycin powder was placed in the surgical bed.   One gram of topical tranexamic acid was injected into the joint.  The fascia was closed with #1 vicryl plus, the deep fat layer was closed with 0 vicryl, the subcutaneous layers closed with 2.0 Vicryl Plus and the skin closed with 2.0 nylon and dermabond. A sterile dressing was applied. The patient was awakened in the operating room and taken to recovery in stable condition.  All sponge, needle, and instrument counts were correct at the end of  the case.   Denise Morgan Morgan, my PA, was a medical necessity for opening, closing, limb positioning, retracting, exposing, and overall facilitation and timely completion of the surgery.  Position: supine  Complications:  see description of procedure.  Time Out: performed   Drains/Packing: none  Estimated blood loss: see anesthesia record  Returned to Recovery Room: in good condition.   Antibiotics: yes   Mechanical VTE (DVT) Prophylaxis: sequential compression devices, TED thigh-high  Chemical VTE (DVT) Prophylaxis: xarelto   Fluid Replacement: see anesthesia record  Specimens Removed: 1 to pathology   Sponge and Instrument Count Correct? yes   PACU: portable radiograph - low AP   Plan/RTC: Return in 2 weeks for staple removal. Weight Bearing/Load Lower Extremity: full  Hip precautions: none Suture Removal: 2 weeks   [name], MD [facility] 1:09 PM   Implant Name Type Inv. Item Serial No. Manufacturer Lot No. LRB No. Used Action  ACETAB CUP W/GRIPTION 54 - [id number] Plate ACETAB CUP W/GRIPTION 54  [employer] [id number] Right 1 Implanted  LINER NEUTRAL 54X36MM PLUS 4 - [id number] Hips LINER NEUTRAL 54X36MM PLUS 4  [employer] [id number] Right 1 Implanted  STEM FEM ACTIS STD SZ4 - [id number] Stem STEM FEM ACTIS STD SZ4  [employer] [id number] Right 1 Implanted  HEAD CERAMIC 36 PLUS 8.5 12 14  - [id number] Hips HEAD CERAMIC 36 PLUS 8.5 12 14   [employer] [id number] Right 1 Implanted

## 2023-02-21 NOTE — Anesthesia Preprocedure Evaluation (Addendum)
Anesthesia Evaluation  Patient identified by MRN, date of birth, ID band Patient awake    Reviewed: Allergy & Precautions, H&P , NPO status , Patient's Chart, lab work & pertinent test results  History of Anesthesia Complications (+) Family history of anesthesia reaction  Airway Mallampati: II  TM Distance: >3 FB Neck ROM: Full    Dental no notable dental hx. (+) Teeth Intact, Dental Advisory Given   Pulmonary neg pulmonary ROS, asthma , Current Smoker and Patient abstained from smoking.   Pulmonary exam normal breath sounds clear to auscultation       Cardiovascular Exercise Tolerance: Good hypertension, Pt. on medications negative cardio ROS Normal cardiovascular exam+ dysrhythmias  Rhythm:Regular Rate:Normal     Neuro/Psych  Headaches PSYCHIATRIC DISORDERS  Depression    negative neurological ROS  negative psych ROS   GI/Hepatic negative GI ROS, Neg liver ROS,,,  Endo/Other  negative endocrine ROSHypothyroidism    Renal/GU negative Renal ROS  negative genitourinary   Musculoskeletal negative musculoskeletal ROS (+) Arthritis ,    Abdominal   Peds negative pediatric ROS (+)  Hematology negative hematology ROS (+) Blood dyscrasia, anemia   Anesthesia Other Findings   Reproductive/Obstetrics negative OB ROS                             Anesthesia Physical Anesthesia Plan  ASA: 3  Anesthesia Plan: Spinal and MAC   Post-op Pain Management:    Induction: Intravenous  PONV Risk Score and Plan: 2 and Ondansetron, Propofol infusion and Treatment may vary due to age or medical condition  Airway Management Planned: Natural Airway, Simple Face Mask, Nasal Cannula and Mask  Additional Equipment: None  Intra-op Plan:   Post-operative Plan:   Informed Consent: I have reviewed the patients History and Physical, chart, labs and discussed the procedure including the risks, benefits and  alternatives for the proposed anesthesia with the patient or authorized representative who has indicated his/her understanding and acceptance.       Plan Discussed with: Anesthesiologist and CRNA  Anesthesia Plan Comments: (  )        Anesthesia Quick Evaluation

## 2023-02-21 NOTE — H&P (Signed)
PREOPERATIVE H&P  Chief Complaint: right hip osteoarthritis  HPI: Denise Morgan is a 57 y.o. female who presents for surgical treatment of right hip osteoarthritis.  She denies any changes in medical history.  Past Surgical History:  Procedure Laterality Date   ABDOMINAL HYSTERECTOMY  03/01/2004   CHOLECYSTECTOMY     COLONOSCOPY W/ POLYPECTOMY  07/2020   Social History   Socioeconomic History   Marital status: Single    Spouse name: Not on file   Number of children: Not on file   Years of education: Not on file   Highest education level: Not on file  Occupational History   Not on file  Tobacco Use   Smoking status: Every Day    Current packs/day: 0.75    Average packs/day: 0.8 packs/day for 33.0 years (24.8 ttl pk-yrs)    Types: Cigarettes   Smokeless tobacco: Never  Vaping Use   Vaping status: Never Used  Substance and Sexual Activity   Alcohol use: Not Currently    Comment: 2-3x/week - wine   Drug use: No   Sexual activity: Not Currently    Birth control/protection: Surgical  Other Topics Concern   Not on file  Social History Narrative   Not on file   Social Drivers of Health   Financial Resource Strain: Not on file  Food Insecurity: Not on file  Transportation Needs: Not on file  Physical Activity: Not on file  Stress: Not on file  Social Connections: Not on file   Family History  Problem Relation Age of Onset   Colon polyps Mother    Ovarian cancer Mother    Arthritis Mother    Colon cancer Maternal Aunt    Esophageal cancer Neg Hx    Rectal cancer Neg Hx    Stomach cancer Neg Hx    Allergies  Allergen Reactions   Latex Itching and Rash   Prior to Admission medications   Medication Sig Start Date End Date Taking? Authorizing Provider  Albuterol-Budesonide (AIRSUPRA) 90-80 MCG/ACT AERO Inhale 2 puffs into the lungs 3 (three) times daily as needed (shortness of breath).   Yes [provider]  docusate sodium (COLACE) 100 MG  capsule Take 1 capsule (100 mg total) by mouth daily as needed. 02/14/23 02/14/24  Cristie Hem, PA-C  fluticasone (FLONASE) 50 MCG/ACT nasal spray Place 2 sprays into both nostrils daily. Patient taking differently: Place 2 sprays into both nostrils daily as needed for allergies. 04/05/22  Yes Etta Grandchild, MD  indapamide (LOZOL) 2.5 MG tablet TAKE 1 TABLET BY MOUTH EVERY DAY 12/16/22  Yes Etta Grandchild, MD  levothyroxine (SYNTHROID) 50 MCG tablet TAKE 1 TABLET BY MOUTH DAILY BEFORE BREAKFAST 03/03/22  Yes Etta Grandchild, MD  meloxicam (MOBIC) 15 MG tablet TAKE 1 TABLET (15 MG TOTAL) BY MOUTH DAILY. 02/02/23  Yes Etta Grandchild, MD  methocarbamol (ROBAXIN-750) 750 MG tablet Take 1 tablet (750 mg total) by mouth 2 (two) times daily as needed for muscle spasms. 02/14/23   Cristie Hem, PA-C  nadolol (CORGARD) 20 MG tablet Take 1 tablet (20 mg total) by mouth daily. 01/04/23  Yes Etta Grandchild, MD  naproxen sodium (ALEVE) 220 MG tablet Take 440 mg by mouth 2 (two) times daily as needed (pain).   Yes [provider]  oxyCODONE (ROXICODONE) 5 MG immediate release tablet Take 1 tablet (5 mg total) by mouth every 8 (eight) hours as needed for severe pain (pain score 7-10). 02/02/23  Yes Etta Grandchild, MD  oxyCODONE-acetaminophen (PERCOCET) 5-325 MG tablet Take 1-2 tablets by mouth every 6 (six) hours as needed. To be taken after surgery 02/14/23   Cristie Hem, PA-C  potassium chloride (KLOR-CON) 8 MEQ tablet TAKE 1 TABLET (8 MEQ TOTAL) BY MOUTH 3 (THREE) TIMES DAILY. 02/02/23  Yes Etta Grandchild, MD  rivaroxaban (XARELTO) 10 MG TABS tablet Take 1 tablet (10 mg total) by mouth daily. To be taken after surgery to prevent blood clots 02/14/23   Cristie Hem, PA-C  traZODone (DESYREL) 100 MG tablet TAKE 2 TABLETS BY MOUTH AT BEDTIME 10/04/22  Yes Etta Grandchild, MD     Positive ROS: All other systems have been reviewed and were otherwise negative with the exception of those  mentioned in the HPI and as above.  Physical Exam: General: Alert, no acute distress Cardiovascular: No pedal edema Respiratory: No cyanosis, no use of accessory musculature GI: abdomen soft Skin: No lesions in the area of chief complaint Neurologic: Sensation intact distally Psychiatric: Patient is competent for consent with normal mood and affect Lymphatic: no lymphedema  MUSCULOSKELETAL: exam stable  Assessment: right hip osteoarthritis  Plan: Plan for Procedure(s): RIGHT TOTAL HIP ARTHROPLASTY ANTERIOR APPROACH  The risks benefits and alternatives were discussed with the patient including but not limited to the risks of nonoperative treatment, versus surgical intervention including infection, bleeding, nerve injury,  blood clots, cardiopulmonary complications, morbidity, mortality, among others, and they were willing to proceed.   Glee Arvin, MD 02/21/2023 8:54 AM

## 2023-02-21 NOTE — Transfer of Care (Signed)
Immediate Anesthesia Transfer of Care Note  Patient: Denise Morgan  Procedure(s) Performed: RIGHT TOTAL HIP ARTHROPLASTY ANTERIOR APPROACH (Right: Hip)  Patient Location: PACU  Anesthesia Type:MAC  Level of Consciousness: awake, alert , and oriented  Airway & Oxygen Therapy: Patient Spontanous Breathing  Post-op Assessment: Report given to RN  Post vital signs: Reviewed and stable  Last Vitals:  Vitals Value Taken Time  BP 114/73 02/21/23 1347  Temp    Pulse 60 02/21/23 1350  Resp 14 02/21/23 1350  SpO2 95 % 02/21/23 1350  Vitals shown include unfiled device data.  Last Pain:  Vitals:   02/21/23 0953  PainSc: 7       Patients Stated Pain Goal: 0 (02/21/23 0953)  Complications: No notable events documented.

## 2023-02-22 ENCOUNTER — Encounter (HOSPITAL_COMMUNITY): Payer: Self-pay | Admitting: Orthopaedic Surgery

## 2023-02-22 DIAGNOSIS — M1611 Unilateral primary osteoarthritis, right hip: Secondary | ICD-10-CM | POA: Diagnosis not present

## 2023-02-22 NOTE — Progress Notes (Signed)
   Subjective:  Patient reports pain as marked.   Able to walk to the door with walker.  Objective:   VITALS:   Vitals:   02/21/23 2131 02/21/23 2337 02/22/23 0442 02/22/23 0755  BP: 120/74 114/61 125/69 116/67  Pulse: 61 60 (!) 58 (!) 57  Resp:  18 18 17   Temp: 98.2 F (36.8 C) 97.9 F (36.6 C) 98.9 F (37.2 C) 98.6 F (37 C)  TempSrc: Oral Oral Oral Oral  SpO2: 99% 94% 99% 98%    Dressing intact Good quad function NVI   Lab Results  Component Value Date   WBC 4.8 02/15/2023   HGB 12.9 02/15/2023   HCT 37.7 02/15/2023   MCV 100.8 (H) 02/15/2023   PLT 214 02/15/2023     Assessment/Plan:  1 Day Post-Op   - Expected postop acute blood loss anemia - Up with PT/OT - DVT ppx - SCDs, ambulation, xarelto - WBAT operative extremity - Pain control - Discharge planning - possibly today vs tomorrow depending on pain control  Denise Morgan 02/22/2023, 8:44 AM

## 2023-02-22 NOTE — Progress Notes (Signed)
S/P R Total Hip Arthroplasty    02/22/23 0917  TOC Brief Assessment  Insurance and Status Reviewed  Patient has primary care physician Yes  Home environment has been reviewed From home with sgo , Everlean Alstrom  Prior level of function: PTA  independent  with ADL's, owns [employer], [employer]  Prior/Current Home Services No current home services  Social Drivers of Health Review SDOH reviewed no interventions necessary  Readmission risk has been reviewed No  Transition of care needs transition of care needs identified, TOC will continue to follow   Order noted for home health PT. [facility] following for HHPT needs. Order placed for DME rolling walker and referral made with [facility] [phone]). Equipment will be delivered to bedside prior to d/c.  Pt without RX MED concern. Pt states has transportation to home once d/c ready. Gae Gallop RN,BSN,CM [phone]

## 2023-02-22 NOTE — Progress Notes (Signed)
Physical Therapy Treatment Patient Details Name: Denise Morgan MRN: [id number] DOB: May 03, 1965 Today's Date: 02/22/2023   History of Present Illness Pt is 57 year old presented to [facility] on 02/21/23 for Rt THR. PMH - HTN, arthritis.    PT Comments  Pt received in chair, agreeable to therapy session and with good participation and improved tolerance for transfer training. Pt reports continued moderate to severe RLE>LLE pain during gait trial and able to perform shorter household distance ambulation task with heavy reliance on RW support. SpO2/HR WFL on RA, pt reports mild dizziness at end of session once back in chair (No BP monitor in room) and plan to assess orthostatic BP in next session if still symptomatic. Pt continues to benefit from PT services to progress toward functional mobility goals. Pt reports MD does not plan to discharge her until tomorrow at the earliest, and is not yet able to perform stairs or typical household gait distance to get to her bedroom at home so will need additional PM session to continue to progress.   If plan is discharge home, recommend the following: A little help with walking and/or transfers;A little help with bathing/dressing/bathroom;Assistance with cooking/housework;Help with stairs or ramp for entrance;Assist for transportation   Can travel by private vehicle        Equipment Recommendations  Rolling walker (2 wheels)    Recommendations for Other Services       Precautions / Restrictions Precautions Precautions: Fall Restrictions Weight Bearing Restrictions Per Provider Order: Yes RLE Weight Bearing Per Provider Order: Weight bearing as tolerated     Mobility  Bed Mobility Overal bed mobility: Needs Assistance             General bed mobility comments: pt received in recliner    Transfers Overall transfer level: Needs assistance Equipment used: Rolling walker (2 wheels) Transfers: Sit to/from Stand Sit to Stand: Min assist,  Contact guard assist           General transfer comment: minA for sit>stand from chair with mod cues for sequencing/body mechanics, then CGA for stand>sit safety using arm rests    Ambulation/Gait Ambulation/Gait assistance: Contact guard assist Gait Distance (Feet): 45 Feet Assistive device: Rolling walker (2 wheels) Gait Pattern/deviations: Step-to pattern, Decreased stride length, Decreased step length - left, Decreased step length - right, Decreased weight shift to right, Wide base of support Gait velocity: Grossly <0.1 m/s Gait velocity interpretation: <1.31 ft/sec, indicative of household ambulator   General Gait Details: Very painful throughout, heavy reliance on RW, pt c/o mild dizziness toward end of trial but did not report this until she was already sitting back in the chair. Greatly increased time to initiate and perform each step forward due to pain. Pt c/o BUE and shoulder discomfort from RW use and IV in her hand, offered to try washcloth wrapped around handles in next session to see if this is less painful for her.   Stairs Stairs:  (verbal/visual review for technique, defer to PM session due to pt pain level)           Wheelchair Mobility     Tilt Bed    Modified Rankin (Stroke Patients Only)       Balance Overall balance assessment: Mild deficits observed, not formally tested (reliant on RW)  Cognition Arousal: Alert Behavior During Therapy: WFL for tasks assessed/performed Overall Cognitive Status: Within Functional Limits for tasks assessed                                 General Comments: Cooperative as able        Exercises Other Exercises Other Exercises: supine BLE AROM: ankle pumps, verbal review for IS use, pt given HEP handout to reinforce supine/seated exercises in between sessions and notated on sheet pt is not to perform standing exercises unless someone is there  to assist her. Other Exercises: pt reports achieving ~2000 mL on IS    General Comments General comments (skin integrity, edema, etc.): SpO2 98% on RA; HR 67 bpm with exertion, HR 50's bpm resting prior to gait trial per chart review. No acute s/sx distress other than pain until pt sat back in chair then reports mild dizziness, plan to check orthostatic BP in next session if pt still symptomatic.      Pertinent Vitals/Pain Pain Assessment Pain Assessment: Faces Faces Pain Scale: Hurts whole lot Pain Location: Bilateral hips, started as 6 on R but got worse during ambulation Pain Descriptors / Indicators: Operative site guarding, Grimacing, Sore, Sharp Pain Intervention(s): Limited activity within patient's tolerance, Monitored during session, Premedicated before session, Repositioned, Ice applied    Home Living                          Prior Function            PT Goals (current goals can now be found in the care plan section) Acute Rehab PT Goals Patient Stated Goal: Moving around well so I can go home, and to get my other hip taken care of ASAP so I can walk with less pain. PT Goal Formulation: With patient Time For Goal Achievement: 02/28/23 Progress towards PT goals: Progressing toward goals    Frequency    7X/week      PT Plan      Co-evaluation              AM-PAC PT "6 Clicks" Mobility   Outcome Measure  Help needed turning from your back to your side while in a flat bed without using bedrails?: A Little Help needed moving from lying on your back to sitting on the side of a flat bed without using bedrails?: A Lot Help needed moving to and from a bed to a chair (including a wheelchair)?: A Little Help needed standing up from a chair using your arms (e.g., wheelchair or bedside chair)?: A Little Help needed to walk in hospital room?: A Little Help needed climbing 3-5 steps with a railing? : Total 6 Click Score: 15    End of Session Equipment  Utilized During Treatment: Gait belt Activity Tolerance: Patient tolerated treatment well;Patient limited by pain Patient left: with call bell/phone within reach;with family/visitor present;in chair (spouse maurice in the room) Nurse Communication: Mobility status;Other (comment) (per pt, MD said she won't be leaving until tomorrow) PT Visit Diagnosis: Other abnormalities of gait and mobility (R26.89);Muscle weakness (generalized) (M62.81);Pain Pain - Right/Left: Right (and left, L not as bad a R) Pain - part of body: Hip     Time: 1610-9604 PT Time Calculation (min) (ACUTE ONLY): 41 min  Charges:    $Gait Training: 23-37 mins $Therapeutic Activity: 8-22 mins PT General Charges $$ ACUTE PT VISIT: 1 Visit  Florina Ou., PTA Acute Rehabilitation Services Secure Chat Preferred 9a-5:30pm Office: [phone]    Dorathy Kinsman [facility] 02/22/2023, 12:03 PM

## 2023-02-22 NOTE — Plan of Care (Signed)

## 2023-02-22 NOTE — Progress Notes (Signed)
Physical Therapy Treatment Patient Details Name: Denise Morgan MRN: [id number] DOB: 31-Mar-1965 Today's Date: 02/22/2023   History of Present Illness Pt is 57 year old presented to [facility] on 02/21/23 for Rt THR. PMH - HTN, arthritis.    PT Comments  Pt received up in bathroom (toileting), agreeable to therapy session and with good participation and tolerance for transfer and gait training with emphasis on VS monitoring and activity pacing/energy conservation. Pt with very slow, antalgic gait pattern and with only mild c/o dizziness but found to be orthostatic, RN notified. BP increased once back in supine. PTA encouraged hydration/drinking water rather than just soda due to post-op fluid loss and pt encouraged to order dinner as well. Pt needing up to CGA for transfers and up to minA for bed mobility using gait belt as a leg lifter. Pt continues to benefit from PT services to progress toward functional mobility goals, post-acute therapies per surgeon's recommendations.    Vital Signs  Patient Position (if appropriate) Orthostatic Vitals  Orthostatic Standing at 0 minutes  BP- Standing at 0 minutes 106/78  Pulse- Standing at 0 minutes 83  Orthostatic Standing at 3 minutes  BP- Standing at 3 minutes 124/78  Pulse- Standing at 3 minutes 84   Orthostatic Lying   BP- Lying 141/79  Pulse- Lying 58     If plan is discharge home, recommend the following: A little help with walking and/or transfers;A little help with bathing/dressing/bathroom;Assistance with cooking/housework;Help with stairs or ramp for entrance;Assist for transportation   Can travel by private vehicle        Equipment Recommendations  Rolling walker (2 wheels)    Recommendations for Other Services       Precautions / Restrictions Precautions Precautions: Fall Precaution Comments: watch BP, OH 12/24 mild symptoms Restrictions Weight Bearing Restrictions Per Provider Order: Yes RLE Weight Bearing Per Provider Order:  Weight bearing as tolerated     Mobility  Bed Mobility Overal bed mobility: Needs Assistance Bed Mobility: Sit to Supine       Sit to supine: Min assist, HOB elevated, Used rails   General bed mobility comments: pt self-assist RLE via gait belt as leg lifter, up to minA for safety with RLE bracing once up at very edge of bed, but mostly CGA with use of hospital bed features/rails.    Transfers Overall transfer level: Needs assistance Equipment used: Rolling walker (2 wheels) Transfers: Sit to/from Stand Sit to Stand: Contact guard assist, From elevated surface           General transfer comment: CGA for safety, cues for safe UE/LE placement for decreased pain    Ambulation/Gait Ambulation/Gait assistance: Contact guard assist, Supervision Gait Distance (Feet): 70 Feet Assistive device: Rolling walker (2 wheels) Gait Pattern/deviations: Step-to pattern, Decreased stride length, Decreased step length - left, Decreased step length - right, Decreased weight shift to right, Wide base of support Gait velocity: Grossly <0.1 m/s Gait velocity interpretation: <1.31 ft/sec, indicative of household ambulator   General Gait Details: Very painful throughout, heavy reliance on RW, pt c/o mild dizziness during second half of gait trial, BP 106/78 pt reports it was much higher when she was in supine, so reassessed standing a few mins later and reading improved to 124/78 (94) HR 84 bpm with exertion. Greatly increased time to initiate and perform each step forward due to pain, heavy reliance on RW support. Good carryover of step sequencing cues from AM session.   Stairs Stairs:  (defer until tomorrow due  to pt increased pain with ambulation and mildly symptomatic orthostatic hypotension)           Wheelchair Mobility     Tilt Bed    Modified Rankin (Stroke Patients Only)       Balance Overall balance assessment: Mild deficits observed, not formally tested (reliant on RW)                                           Cognition Arousal: Alert Behavior During Therapy: WFL for tasks assessed/performed Overall Cognitive Status: Within Functional Limits for tasks assessed                                 General Comments: Cooperative, motivated        Exercises Other Exercises Other Exercises: supine BLE AROM: ankle pumps, verbal review for IS use, pt has HEP handout in room to perform TID Other Exercises: pt reports achieving ~2000 mL on IS    General Comments General comments (skin integrity, edema, etc.): see orthostatic BP readings above, pt agreeable to re-don iceman over R hip once back in supine and RLE elevated with heel floated on yellow surgical foam      Pertinent Vitals/Pain Pain Assessment Pain Assessment: 0-10 Pain Score: 6  Faces Pain Scale: Hurts whole lot Pain Location: Bilateral hips, R>L Pain Descriptors / Indicators: Operative site guarding, Grimacing, Sore, Sharp Pain Intervention(s): Limited activity within patient's tolerance, Premedicated before session, Monitored during session, Repositioned, Ice applied     PT Goals (current goals can now be found in the care plan section) Acute Rehab PT Goals Patient Stated Goal: Moving around well so I can go home, and to get my other hip taken care of ASAP so I can walk with less pain. PT Goal Formulation: With patient Time For Goal Achievement: 02/28/23 Progress towards PT goals: Progressing toward goals    Frequency    7X/week      PT Plan         AM-PAC PT "6 Clicks" Mobility   Outcome Measure  Help needed turning from your back to your side while in a flat bed without using bedrails?: A Little Help needed moving from lying on your back to sitting on the side of a flat bed without using bedrails?: A Lot (w/o rails) Help needed moving to and from a bed to a chair (including a wheelchair)?: A Little Help needed standing up from a chair using your  arms (e.g., wheelchair or bedside chair)?: A Little Help needed to walk in hospital room?: A Little Help needed climbing 3-5 steps with a railing? : A Lot 6 Click Score: 16    End of Session Equipment Utilized During Treatment: Gait belt Activity Tolerance: Patient tolerated treatment well;Patient limited by pain Patient left: with call bell/phone within reach;with family/visitor present;in bed;with bed alarm set (spouse maurice in the room and sister arrived at end of session) Nurse Communication: Mobility status;Other (comment) (per pt, MD said she won't be leaving until tomorrow; BP readings) PT Visit Diagnosis: Other abnormalities of gait and mobility (R26.89);Muscle weakness (generalized) (M62.81);Pain Pain - Right/Left: Right (and left, L not as bad a R) Pain - part of body: Hip     Time: 0347-4259 PT Time Calculation (min) (ACUTE ONLY): 38 min  Charges:    $Gait Training: 23-37  mins $Therapeutic Activity: 8-22 mins PT General Charges $$ ACUTE PT VISIT: 1 Visit                     Mally Gavina P., PTA Acute Rehabilitation Services Secure Chat Preferred 9a-5:30pm Office: [phone]    Dorathy Kinsman [facility] 02/22/2023, 3:51 PM

## 2023-02-23 DIAGNOSIS — M1611 Unilateral primary osteoarthritis, right hip: Secondary | ICD-10-CM | POA: Diagnosis not present

## 2023-02-23 NOTE — Plan of Care (Signed)

## 2023-02-23 NOTE — Progress Notes (Signed)
Physical Therapy Treatment Patient Details Name: Denise Morgan MRN: [id number] DOB: 09/07/1965 Today's Date: 02/23/2023   History of Present Illness Pt is 57 year old presented to [facility] on 02/21/23 for Rt THR. PMH - HTN, arthritis.    PT Comments  Pt tolerates treatment well, ambulating for increased distances and negotiating 1 step without physical assistance. Pt is encouraged to mobilize frequently and to perform HEP later this evening once home. Pt is able to perform all mobility necessary in the home setting. PT will follow up tomorrow if the pt remains admitted for any reason.    If plan is discharge home, recommend the following: A little help with walking and/or transfers;A little help with bathing/dressing/bathroom;Assistance with cooking/housework;Help with stairs or ramp for entrance;Assist for transportation   Can travel by private vehicle        Equipment Recommendations  Rolling walker (2 wheels)    Recommendations for Other Services       Precautions / Restrictions Precautions Precautions: Fall Restrictions Weight Bearing Restrictions Per Provider Order: Yes RLE Weight Bearing Per Provider Order: Weight bearing as tolerated     Mobility  Bed Mobility Overal bed mobility: Needs Assistance Bed Mobility: Supine to Sit     Supine to sit: Supervision     General bed mobility comments: increased time, use of gait belt as leg lifter    Transfers Overall transfer level: Needs assistance Equipment used: Rolling walker (2 wheels) Transfers: Sit to/from Stand Sit to Stand: Supervision                Ambulation/Gait Ambulation/Gait assistance: Supervision Gait Distance (Feet): 100 Feet Assistive device: Rolling walker (2 wheels) Gait Pattern/deviations: Step-through pattern Gait velocity: reduced Gait velocity interpretation: <1.8 ft/sec, indicate of risk for recurrent falls   General Gait Details: slowed step-through gait   Stairs Stairs:  Yes Stairs assistance: Supervision Stair Management: No rails, Step to pattern, Forwards Number of Stairs: 1 General stair comments: one step 2 trials   Wheelchair Mobility     Tilt Bed    Modified Rankin (Stroke Patients Only)       Balance Overall balance assessment: Needs assistance Sitting-balance support: Feet supported, No upper extremity supported Sitting balance-Leahy Scale: Good     Standing balance support: Single extremity supported, Reliant on assistive device for balance Standing balance-Leahy Scale: Poor                              Cognition Arousal: Alert Behavior During Therapy: WFL for tasks assessed/performed Overall Cognitive Status: Within Functional Limits for tasks assessed                                          Exercises      General Comments General comments (skin integrity, edema, etc.): pt in NAD, asymptomatic      Pertinent Vitals/Pain Pain Assessment Pain Assessment: Faces Faces Pain Scale: Hurts even more Pain Location: R hip Pain Descriptors / Indicators: Grimacing Pain Intervention(s): Monitored during session    Home Living                          Prior Function            PT Goals (current goals can now be found in the care plan section) Acute Rehab PT  Goals Patient Stated Goal: Moving around well so I can go home, and to get my other hip taken care of ASAP so I can walk with less pain. Progress towards PT goals: Progressing toward goals    Frequency    7X/week      PT Plan      Co-evaluation              AM-PAC PT "6 Clicks" Mobility   Outcome Measure  Help needed turning from your back to your side while in a flat bed without using bedrails?: A Little Help needed moving from lying on your back to sitting on the side of a flat bed without using bedrails?: A Little Help needed moving to and from a bed to a chair (including a wheelchair)?: A Little Help  needed standing up from a chair using your arms (e.g., wheelchair or bedside chair)?: A Little Help needed to walk in hospital room?: A Little Help needed climbing 3-5 steps with a railing? : A Little 6 Click Score: 18    End of Session Equipment Utilized During Treatment: Gait belt Activity Tolerance: Patient tolerated treatment well Patient left: in chair;with call bell/phone within reach Nurse Communication: Mobility status PT Visit Diagnosis: Other abnormalities of gait and mobility (R26.89);Muscle weakness (generalized) (M62.81);Pain Pain - Right/Left: Right Pain - part of body: Hip     Time: 0831-0901 PT Time Calculation (min) (ACUTE ONLY): 30 min  Charges:    $Gait Training: 8-22 mins $Therapeutic Activity: 8-22 mins PT General Charges $$ ACUTE PT VISIT: 1 Visit                     Arlyss Gandy, PT, DPT Acute Rehabilitation Office [phone]    Arlyss Gandy 02/23/2023, 9:18 AM

## 2023-02-23 NOTE — Progress Notes (Signed)
   Subjective:  Patient reports pain as mild.    Objective:   VITALS:   Vitals:   02/22/23 1354 02/22/23 2035 02/23/23 0543 02/23/23 0730  BP: 134/77 138/66 (!) 142/76 130/69  Pulse: (!) 57 61 61 (!) 57  Resp: 16 18 20 18   Temp: 97.9 F (36.6 C) 98.3 F (36.8 C) 98.7 F (37.1 C) 98.5 F (36.9 C)  TempSrc: Oral Oral    SpO2: 96% 97% 97% 97%    Exam stable   Lab Results  Component Value Date   WBC 4.8 02/15/2023   HGB 12.9 02/15/2023   HCT 37.7 02/15/2023   MCV 100.8 (H) 02/15/2023   PLT 214 02/15/2023     Assessment/Plan:  2 Days Post-Op   - discussed that ice machine can be used every 30 minutes - progressing appropriately with PT/OT, stair training today - anticipate d/c home today after stair training  Glee Arvin 02/23/2023, 7:35 AM

## 2023-02-23 NOTE — Discharge Summary (Signed)
Patient ID: Denise Morgan MRN: [id number] DOB/AGE: 57-Feb-1967 57 y.o.  Admit date: 02/21/2023 Discharge date: 02/23/2023  Admission Diagnoses:  Primary osteoarthritis of right hip  Discharge Diagnoses:  Principal Problem:   Primary osteoarthritis of right hip Active Problems:   Status post total replacement of right hip   Past Medical History:  Diagnosis Date   Allergy    Anemia    15+ years ago   Arthritis    Asthma    Blood transfusion without reported diagnosis    Depression    Dysrhythmia    Palpitations - Holter monitor in 2024   Family history of adverse reaction to anesthesia    sister had episode with anesthesia once, but no additional information given   Frequent headaches    Resolved as of 2024   History of phlebitis    Hx of adenomatous polyp of colon 08/15/2015   Hypertension    Hypothyroidism    Palpitations    heart monitor all normal per pt.2017   PE (pulmonary embolism)     Surgeries: Procedure(s): RIGHT TOTAL HIP ARTHROPLASTY ANTERIOR APPROACH on 02/21/2023   Consultants (if any):   Discharged Condition: Improved  Hospital Course: Denise Morgan is an 57 y.o. female who was admitted 02/21/2023 with a diagnosis of Primary osteoarthritis of right hip and went to the operating room on 02/21/2023 and underwent the above named procedures.    She was given perioperative antibiotics:  Anti-infectives (From admission, onward)    Start     Dose/Rate Route Frequency Ordered Stop   02/21/23 1800  ceFAZolin (ANCEF) IVPB 2g/100 mL premix        2 g 200 mL/hr over 30 Minutes Intravenous Every 6 hours 02/21/23 1640 02/22/23 0128   02/21/23 1245  vancomycin (VANCOCIN) powder  Status:  Discontinued          As needed 02/21/23 1245 02/21/23 1349   02/21/23 0933  ceFAZolin (ANCEF) 2-4 GM/100ML-% IVPB       Note to Pharmacy: Payton Emerald A: cabinet override      02/21/23 0933 02/21/23 2144   02/21/23 0930  ceFAZolin (ANCEF) IVPB 2g/100 mL  premix        2 g 200 mL/hr over 30 Minutes Intravenous On call to O.R. 02/21/23 1093 02/21/23 1212     .  She was given sequential compression devices, early ambulation, and appropriate chemoprophylaxis for DVT prophylaxis.  She benefited maximally from the hospital stay and there were no complications.    Recent vital signs:  Vitals:   02/23/23 0543 02/23/23 0730  BP: (!) 142/76 130/69  Pulse: 61 (!) 57  Resp: 20 18  Temp: 98.7 F (37.1 C) 98.5 F (36.9 C)  SpO2: 97% 97%    Recent laboratory studies:  Lab Results  Component Value Date   HGB 12.9 02/15/2023   HGB 14.8 01/04/2023   HGB 13.6 02/10/2022   Lab Results  Component Value Date   WBC 4.8 02/15/2023   PLT 214 02/15/2023   Lab Results  Component Value Date   INR 3.2 11/24/2012   Lab Results  Component Value Date   NA 133 (L) 02/15/2023   K 4.1 02/15/2023   CL 103 02/15/2023   CO2 21 (L) 02/15/2023   BUN 17 02/15/2023   CREATININE 0.76 02/15/2023   GLUCOSE 91 02/15/2023    Discharge Medications:   Allergies as of 02/23/2023       Reactions   Latex Itching, Rash  Medication List     STOP taking these medications    meloxicam 15 MG tablet Commonly known as: MOBIC       TAKE these medications    Airsupra 90-80 MCG/ACT Aero Generic drug: Albuterol-Budesonide Inhale 2 puffs into the lungs 3 (three) times daily as needed (shortness of breath).   docusate sodium 100 MG capsule Commonly known as: Colace Take 1 capsule (100 mg total) by mouth daily as needed.   fluticasone 50 MCG/ACT nasal spray Commonly known as: FLONASE Place 2 sprays into both nostrils daily.   indapamide 2.5 MG tablet Commonly known as: LOZOL TAKE 1 TABLET BY MOUTH EVERY DAY   levothyroxine 50 MCG tablet Commonly known as: SYNTHROID TAKE 1 TABLET BY MOUTH DAILY BEFORE BREAKFAST   methocarbamol 750 MG tablet Commonly known as: Robaxin-750 Take 1 tablet (750 mg total) by mouth 2 (two) times daily as  needed for muscle spasms.   nadolol 20 MG tablet Commonly known as: Corgard Take 1 tablet (20 mg total) by mouth daily.   naproxen sodium 220 MG tablet Commonly known as: ALEVE Take 440 mg by mouth 2 (two) times daily as needed (pain).   oxyCODONE 5 MG immediate release tablet Commonly known as: Roxicodone Take 1 tablet (5 mg total) by mouth every 8 (eight) hours as needed for severe pain (pain score 7-10).   oxyCODONE-acetaminophen 5-325 MG tablet Commonly known as: Percocet Take 1-2 tablets by mouth every 6 (six) hours as needed. To be taken after surgery   potassium chloride 8 MEQ tablet Commonly known as: KLOR-CON TAKE 1 TABLET (8 MEQ TOTAL) BY MOUTH 3 (THREE) TIMES DAILY.   rivaroxaban 10 MG Tabs tablet Commonly known as: XARELTO Take 1 tablet (10 mg total) by mouth daily. To be taken after surgery to prevent blood clots   traZODone 100 MG tablet Commonly known as: DESYREL TAKE 2 TABLETS BY MOUTH AT BEDTIME               Durable Medical Equipment  (From admission, onward)           Start     Ordered   02/22/23 0924  For home use only DME Walker rolling  Once       Question Answer Comment  Walker: With 5 Inch Wheels   Patient needs a walker to treat with the following condition Gait instability      02/22/23 0923   02/21/23 1641  DME Walker rolling  Once       Question:  Patient needs a walker to treat with the following condition  Answer:  History of hip replacement   02/21/23 1640   02/21/23 1641  DME 3 n 1  Once        02/21/23 1640   02/21/23 1641  DME Bedside commode  Once       Question:  Patient needs a bedside commode to treat with the following condition  Answer:  History of hip replacement   02/21/23 1640            Diagnostic Studies: DG Pelvis Portable Result Date: 02/21/2023 CLINICAL DATA:  Postop. EXAM: PORTABLE PELVIS 1-2 VIEWS COMPARISON:  None Available. FINDINGS: Right hip arthroplasty in expected alignment. No periprosthetic  lucency or fracture. Recent postsurgical change includes air and edema in the soft tissues. IMPRESSION: Right hip arthroplasty without immediate postoperative complication. Electronically Signed   By: Narda Rutherford M.D.   On: 02/21/2023 15:12   DG HIP UNILAT WITH PELVIS 1V RIGHT  Result Date: 02/21/2023 CLINICAL DATA:  Elective surgery. EXAM: DG HIP (WITH OR WITHOUT PELVIS) 1V RIGHT COMPARISON:  None Available. FINDINGS: Two fluoroscopic spot views of the pelvis and right hip obtained in the operating room. Images during hip arthroplasty. Fluoroscopy time 33 seconds. Dose 4.69 mGy. IMPRESSION: Intraoperative fluoroscopy during right hip arthroplasty. Electronically Signed   By: Narda Rutherford M.D.   On: 02/21/2023 15:12   DG C-Arm 1-60 Min-No Report Result Date: 02/21/2023 Fluoroscopy was utilized by the requesting physician.  No radiographic interpretation.   DG C-Arm 1-60 Min-No Report Result Date: 02/21/2023 Fluoroscopy was utilized by the requesting physician.  No radiographic interpretation.    Disposition:      Follow-up Information     Etta Grandchild, MD Follow up.   Specialty: Internal Medicine Contact information: [address] [place] [address] [phone]         [facility] Follow up.   Why: home health services will  be provided by [facility] information: [address] [place] [address] [phone]                  Signed: Glee Arvin 02/23/2023, 7:42 AM

## 2023-02-23 NOTE — Progress Notes (Signed)
CSW spoke with RN who states patient's walker has been delivered to bedside and will discharge home today.  CSW informed Amy at [facility] of patient's discharge plan.  Edwin Dada, MSW, LCSW Transitions of Care  Clinical Social Worker II [phone]

## 2023-02-23 NOTE — Progress Notes (Addendum)
Pt has DC order and was cleared by PT, delayed in the discharge as pt was not feeling well after the PT session and felt nauseous .  Case manager was ok for pt to DC, HH has been set-up, walker was delivered. AVS was given and explained to pt, icemachine was sent home. Significant others provided transportation.

## 2023-02-24 ENCOUNTER — Telehealth: Payer: Self-pay

## 2023-02-24 NOTE — Telephone Encounter (Signed)
Denise Morgan with [facility] called wanting physical therapy order for this pt 1 wk 1 and 2 wk 2. DG#[phone] secure line may leave message if she does not answer. This pt is s/p a right THA 02/21/2023

## 2023-02-24 NOTE — Telephone Encounter (Signed)
Called and gave verbal 

## 2023-02-26 ENCOUNTER — Other Ambulatory Visit: Payer: Self-pay | Admitting: Internal Medicine

## 2023-02-26 DIAGNOSIS — E039 Hypothyroidism, unspecified: Secondary | ICD-10-CM

## 2023-02-28 ENCOUNTER — Telehealth: Payer: Self-pay

## 2023-02-28 NOTE — Telephone Encounter (Signed)
Spoke with patient. She has some drainage under the bandage that she can see. Nothing is coming out of the bandage. She is going to send a picture through MyChart.

## 2023-02-28 NOTE — Telephone Encounter (Signed)
Patient called wanting to know if it is normal to have some drainage from her right hip?  Stated that she can see it coming through from under the bandage.  Patient had right THA surgery on 02/21/2023.  CB# [phone].  Please advise.  Thank you.

## 2023-03-01 ENCOUNTER — Other Ambulatory Visit: Payer: Self-pay | Admitting: Physician Assistant

## 2023-03-01 MED ORDER — DOXYCYCLINE HYCLATE 100 MG PO CAPS
100.0000 mg | ORAL_CAPSULE | Freq: Two times a day (BID) | ORAL | 0 refills | Status: DC
Start: 2023-03-01 — End: 2023-04-06

## 2023-03-08 ENCOUNTER — Ambulatory Visit (INDEPENDENT_AMBULATORY_CARE_PROVIDER_SITE_OTHER): Payer: 59 | Admitting: Physician Assistant

## 2023-03-08 ENCOUNTER — Encounter: Payer: Self-pay | Admitting: Physician Assistant

## 2023-03-08 DIAGNOSIS — Z96641 Presence of right artificial hip joint: Secondary | ICD-10-CM

## 2023-03-08 MED ORDER — OXYCODONE-ACETAMINOPHEN 5-325 MG PO TABS: 1.0000 | ORAL_TABLET | Freq: Three times a day (TID) | ORAL | 0 refills | Status: DC | PRN

## 2023-03-08 MED ORDER — MUPIROCIN 2 % EX OINT: 1.0000 | TOPICAL_OINTMENT | Freq: Two times a day (BID) | CUTANEOUS | 0 refills | Status: DC

## 2023-03-08 NOTE — Progress Notes (Signed)
 Post-Op Visit Note   Patient: Denise Morgan           Date of Birth: 01-09-1966           MRN: [id number] Visit Date: 03/08/2023 PCP: Joshua Debby CROME, MD   Assessment & Plan:  Chief Complaint:  Chief Complaint  Patient presents with   Right Hip - Follow-up    Right total hip arthroplasty 02/21/2023   Visit Diagnoses:  1. Status post total replacement of right hip     Plan: Patient is a pleasant 58 year old female who comes in today 2 weeks status post right total hip replacement 02/21/2023.  She has been in a fair amount of pain since surgery.  She has been taking Percocet and Robaxin .  She has also had trouble sleeping.  She tells me melatonin and Benadryl  do not help with this.  Currently ambulating with a walker.  Examination of her right hip reveals a well approximated incision.  Nylon sutures in place.  She does have some maceration over the incision.  No evidence of cellulitis or infection.  Calves are soft nontender.  She is neurovascularly intact distally.  Today, sutures were removed.  Steri-Strips were applied over part of the incision.  The parts of the incision that had exposed subcutaneous tissue were covered with mupirocin .  She will do this twice daily and cover with a Band-Aid.  Follow-up in 4 weeks for repeat evaluation and AP pelvis x-rays.  Postop instructions provided.  Call with concerns or questions in the meantime.  Follow-Up Instructions: Return in about 4 weeks (around 04/05/2023).   Orders:  No orders of the defined types were placed in this encounter.  Meds ordered this encounter  Medications   oxyCODONE -acetaminophen  (PERCOCET) 5-325 MG tablet    Sig: Take 1-2 tablets by mouth every 8 (eight) hours as needed.    Dispense:  40 tablet    Refill:  0    Imaging: No new imaging  PMFS History: Patient Active Problem List   Diagnosis Date Noted   Status post total replacement of right hip 02/21/2023   Encounter for general adult medical examination  with abnormal findings 01/04/2023   Prolonged Q-T interval on ECG 01/04/2023   Seasonal allergic rhinitis due to fungal spores 04/05/2022   Moderate persistent asthma with exacerbation 04/05/2022   Encounter for long-term use of opiate analgesic 02/16/2020   Acquired hypothyroidism 02/12/2020   Essential hypertension 04/07/2016   Snoring 04/07/2016   Hx of adenomatous polyp of colon 08/15/2015   Asthma, mild intermittent 05/29/2015   Hyperlipidemia with target LDL less than 130 05/29/2015   Intermittent palpitations 05/29/2015   Subacromial bursitis 04/18/2014   Obesity, morbid (HCC) 02/16/2013   Primary osteoarthritis of right hip 02/16/2013   Tobacco abuse disorder 11/19/2012   Visit for screening mammogram 11/17/2012   DJD (degenerative joint disease) of knee 11/17/2012   Chronic pain syndrome 11/17/2012   Past Medical History:  Diagnosis Date   Allergy    Anemia    15+ years ago   Arthritis    Asthma    Blood transfusion without reported diagnosis    Depression    Dysrhythmia    Palpitations - Holter monitor in 2024   Family history of adverse reaction to anesthesia    sister had episode with anesthesia once, but no additional information given   Frequent headaches    Resolved as of 2024   History of phlebitis    Hx of adenomatous polyp of  colon 08/15/2015   Hypertension    Hypothyroidism    Palpitations    heart monitor all normal per pt.2017   PE (pulmonary embolism)     Family History  Problem Relation Age of Onset   Colon polyps Mother    Ovarian cancer Mother    Arthritis Mother    Colon cancer Maternal Aunt    Esophageal cancer Neg Hx    Rectal cancer Neg Hx    Stomach cancer Neg Hx     Past Surgical History:  Procedure Laterality Date   ABDOMINAL HYSTERECTOMY  03/01/2004   CHOLECYSTECTOMY     COLONOSCOPY W/ POLYPECTOMY  07/2020   TOTAL HIP ARTHROPLASTY Right 02/21/2023   Procedure: RIGHT TOTAL HIP ARTHROPLASTY ANTERIOR APPROACH;  Surgeon: Jerri Kay HERO, MD;  Location: [facility] OR;  Service: Orthopedics;  Laterality: Right;  3-C   Social History   Occupational History   Not on file  Tobacco Use   Smoking status: Every Day    Current packs/day: 0.75    Average packs/day: 0.8 packs/day for 33.0 years (24.8 ttl pk-yrs)    Types: Cigarettes   Smokeless tobacco: Never  Vaping Use   Vaping status: Never Used  Substance and Sexual Activity   Alcohol use: Not Currently    Comment: 2-3x/week - wine   Drug use: No   Sexual activity: Not Currently    Birth control/protection: Surgical

## 2023-03-18 ENCOUNTER — Other Ambulatory Visit: Payer: Self-pay | Admitting: Internal Medicine

## 2023-03-18 DIAGNOSIS — M160 Bilateral primary osteoarthritis of hip: Secondary | ICD-10-CM

## 2023-03-18 DIAGNOSIS — G894 Chronic pain syndrome: Secondary | ICD-10-CM

## 2023-03-18 DIAGNOSIS — M170 Bilateral primary osteoarthritis of knee: Secondary | ICD-10-CM

## 2023-03-18 MED ORDER — OXYCODONE HCL 5 MG PO TABS: 5.0000 mg | ORAL_TABLET | Freq: Three times a day (TID) | ORAL | 0 refills | Status: DC | PRN

## 2023-03-21 ENCOUNTER — Other Ambulatory Visit (INDEPENDENT_AMBULATORY_CARE_PROVIDER_SITE_OTHER): Payer: 59 | Admitting: Pharmacist

## 2023-03-21 VITALS — BP 127/79

## 2023-03-21 DIAGNOSIS — I10 Essential (primary) hypertension: Secondary | ICD-10-CM

## 2023-03-21 NOTE — Progress Notes (Signed)
03/21/2023 Name: Denise Morgan MRN: [id number] DOB: June 14, 1965  Chief Complaint  Patient presents with   Hypertension   Medication Management    Denise Morgan is a 58 y.o. year old female who presented for a telephone visit.   They were referred to the pharmacist by their PCP for assistance in managing hypertension.    Subjective:  Care Team: Primary Care Provider: Etta Grandchild, MD ; Next Scheduled Visit: none scheduled  Medication Access/Adherence  Current Pharmacy:  [id number] [place], [place] - [phone] AT [facility] [address] [place] [address] Phone: [phone] Fax: [phone]  [facility] - [place], [place] - [address] [place] [address] Phone: [phone] Fax: [phone]  [id number] [place], [place] - [address] AT [facility] OF [facility] [address] [place] [address] Phone: [phone] Fax: [phone]  [facility] [address] [place] [address] Phone: [phone] Fax: [phone]   Patient reports affordability concerns with their medications: No  Patient reports access/transportation concerns to their pharmacy: No  Patient reports adherence concerns with their medications:  No     Hypertension:  Current medications: indapamide 2.5 mg daily, nadolol 20 mg daily Medications previously tried:   Patient has a validated, automated, upper arm home BP cuff Current blood pressure readings: Home BP 130/70, 132/84, 122/82, 121/86, 119/83, 132/61, 127/86, 128/87, 125/83, 123/83, 119/80, 127/79, 113/79, 123/79, 144/84   Current physical activity: limited She had hip replacement surgery in December. States pain is okay - comes and goes.  Objective: BP Readings from Last 3 Encounters:  03/21/23 127/79  02/23/23 130/69  02/15/23 (!) 147/86     No results  found for: "HGBA1C"  Lab Results  Component Value Date   CREATININE 0.76 02/15/2023   BUN 17 02/15/2023   NA 133 (L) 02/15/2023   K 4.1 02/15/2023   CL 103 02/15/2023   CO2 21 (L) 02/15/2023    Lab Results  Component Value Date   CHOL 272 (H) 05/13/2022   HDL 99.10 05/13/2022   LDLCALC 159 (H) 05/13/2022   LDLDIRECT 155.6 02/16/2013   TRIG 70.0 05/13/2022   CHOLHDL 3 05/13/2022    Medications Reviewed Today     Reviewed by Bonita Quin, RPH (Pharmacist) on 03/21/23 at 1113  Med List Status: <None>   Medication Order Taking? Sig Documenting Provider Last Dose Status Informant  Albuterol-Budesonide (AIRSUPRA) 90-80 MCG/ACT AERO [id number]  Inhale 2 puffs into the lungs 3 (three) times daily as needed (shortness of breath). [provider]  Active Self  docusate sodium (COLACE) 100 MG capsule [id number] Yes Take 1 capsule (100 mg total) by mouth daily as needed. Cristie Hem, PA-C Taking Active Self           Med Note [name], Turkessa Ostrom R   Mon Mar 21, 2023 11:13 AM)    doxycycline (VIBRAMYCIN) 100 MG capsule [id number]  Take 1 capsule (100 mg total) by mouth 2 (two) times daily. Cristie Hem, PA-C  Active   fluticasone (FLONASE) 50 MCG/ACT nasal spray [id number]  Place 2 sprays into both nostrils daily.  Patient taking differently: Place 2 sprays into both nostrils daily as needed for allergies.   Etta Grandchild, MD  Active Self  indapamide (LOZOL) 2.5 MG tablet [id number] Yes TAKE 1 TABLET BY MOUTH EVERY DAY Etta Grandchild, MD Taking Active  Self  levothyroxine (SYNTHROID) 50 MCG tablet [id number] Yes TAKE 1 TABLET BY MOUTH EVERY DAY BEFORE BREAKFAST Etta Grandchild, MD Taking Active   methocarbamol (ROBAXIN-750) 750 MG tablet [id number] Yes Take 1 tablet (750 mg total) by mouth 2 (two) times daily as needed for muscle spasms. Cristie Hem, PA-C Taking Active Self           Med Note [name], Jisel Fleet R   Mon Mar 21, 2023 11:13 AM)    mupirocin ointment  (BACTROBAN) 2 % [id number]  Apply 1 Application topically 2 (two) times daily. Apply twice daily and cover with a band-aid until healed Cristie Hem, PA-C  Active   nadolol (CORGARD) 20 MG tablet [id number] Yes Take 1 tablet (20 mg total) by mouth daily. Etta Grandchild, MD Taking Active Self  naproxen sodium (ALEVE) 220 MG tablet [id number]  Take 440 mg by mouth 2 (two) times daily as needed (pain). [provider]  Active Self  oxyCODONE (ROXICODONE) 5 MG immediate release tablet [id number] Yes Take 1 tablet (5 mg total) by mouth every 8 (eight) hours as needed for severe pain (pain score 7-10). Etta Grandchild, MD Taking Active   potassium chloride (KLOR-CON) 8 MEQ tablet [id number] Yes TAKE 1 TABLET (8 MEQ TOTAL) BY MOUTH 3 (THREE) TIMES DAILY. Etta Grandchild, MD Taking Active Self  rivaroxaban (XARELTO) 10 MG TABS tablet [id number] Yes Take 1 tablet (10 mg total) by mouth daily. To be taken after surgery to prevent blood clots Cristie Hem, PA-C Taking Active Self           Med Note [name], Drew Lips R   Mon Mar 21, 2023 11:05 AM)    traZODone (DESYREL) 100 MG tablet [id number]  TAKE 2 TABLETS BY MOUTH AT BEDTIME Etta Grandchild, MD  Active Self              Assessment/Plan:   Hypertension: - Currently borderline controlled, BP goal <130/80 -  Recommended to check home blood pressure and heart rate occasionally - Recommend to continue current regimen, has PCP f/u in 2 weeks   Follow Up Plan: PRN   Arbutus Leas, PharmD, BCPS, CPP Clinical Pharmacist Practitioner [facility] [phone]

## 2023-03-21 NOTE — Patient Instructions (Signed)
 It was a pleasure speaking with you today!    Feel free to call with any questions or concerns!  Arbutus Leas, PharmD, BCPS, CPP Clinical Pharmacist Practitioner [facility] at [facility] [phone]

## 2023-04-02 ENCOUNTER — Other Ambulatory Visit: Payer: Self-pay | Admitting: Internal Medicine

## 2023-04-02 DIAGNOSIS — I10 Essential (primary) hypertension: Secondary | ICD-10-CM

## 2023-04-02 DIAGNOSIS — R9431 Abnormal electrocardiogram [ECG] [EKG]: Secondary | ICD-10-CM

## 2023-04-05 ENCOUNTER — Encounter: Payer: 59 | Admitting: Physician Assistant

## 2023-04-06 ENCOUNTER — Encounter: Payer: Self-pay | Admitting: Internal Medicine

## 2023-04-06 ENCOUNTER — Ambulatory Visit: Payer: 59 | Admitting: Internal Medicine

## 2023-04-06 ENCOUNTER — Other Ambulatory Visit: Payer: Self-pay

## 2023-04-06 VITALS — BP 132/88 | HR 53 | Temp 98.0°F | Ht 66.0 in | Wt 226.0 lb

## 2023-04-06 DIAGNOSIS — F5104 Psychophysiologic insomnia: Secondary | ICD-10-CM | POA: Diagnosis not present

## 2023-04-06 DIAGNOSIS — G894 Chronic pain syndrome: Secondary | ICD-10-CM | POA: Diagnosis not present

## 2023-04-06 DIAGNOSIS — K635 Polyp of colon: Secondary | ICD-10-CM | POA: Insufficient documentation

## 2023-04-06 DIAGNOSIS — R001 Bradycardia, unspecified: Secondary | ICD-10-CM | POA: Diagnosis not present

## 2023-04-06 DIAGNOSIS — Z23 Encounter for immunization: Secondary | ICD-10-CM

## 2023-04-06 DIAGNOSIS — Z87891 Personal history of nicotine dependence: Secondary | ICD-10-CM

## 2023-04-06 DIAGNOSIS — Z122 Encounter for screening for malignant neoplasm of respiratory organs: Secondary | ICD-10-CM

## 2023-04-06 DIAGNOSIS — K5140 Inflammatory polyps of colon without complications: Secondary | ICD-10-CM | POA: Insufficient documentation

## 2023-04-06 DIAGNOSIS — F17210 Nicotine dependence, cigarettes, uncomplicated: Secondary | ICD-10-CM

## 2023-04-06 DIAGNOSIS — Z720 Tobacco use: Secondary | ICD-10-CM

## 2023-04-06 MED ORDER — QUETIAPINE FUMARATE 50 MG PO TABS: 50.0000 mg | ORAL_TABLET | Freq: Every day | ORAL | 0 refills | Status: DC

## 2023-04-06 MED ORDER — TRAZODONE HCL 100 MG PO TABS: 200.0000 mg | ORAL_TABLET | Freq: Every day | ORAL | 0 refills | Status: DC

## 2023-04-06 NOTE — Patient Instructions (Signed)
 Insomnia Insomnia is a sleep disorder that makes it difficult to fall asleep or stay asleep. Insomnia can cause fatigue, low energy, difficulty concentrating, mood swings, and poor performance at work or school. There are three different ways to classify insomnia: Difficulty falling asleep. Difficulty staying asleep. Waking up too early in the morning. Any type of insomnia can be long-term (chronic) or short-term (acute). Both are common. Short-term insomnia usually lasts for 3 months or less. Chronic insomnia occurs at least three times a week for longer than 3 months. What are the causes? Insomnia may be caused by another condition, situation, or substance, such as: Having certain mental health conditions, such as anxiety and depression. Using caffeine, alcohol , tobacco, or drugs. Having gastrointestinal conditions, such as gastroesophageal reflux disease (GERD). Having certain medical conditions. These include: Asthma. Alzheimer's disease. Stroke. Chronic pain. An overactive thyroid  gland (hyperthyroidism). Other sleep disorders, such as restless legs syndrome and sleep apnea. Menopause. Sometimes, the cause of insomnia may not be known. What increases the risk? Risk factors for insomnia include: Gender. Females are affected more often than males. Age. Insomnia is more common as people get older. Stress and certain medical and mental health conditions. Lack of exercise. Having an irregular work schedule. This may include working night shifts and traveling between different time zones. What are the signs or symptoms? If you have insomnia, the main symptom is having trouble falling asleep or having trouble staying asleep. This may lead to other symptoms, such as: Feeling tired or having low energy. Feeling nervous about going to sleep. Not feeling rested in the morning. Having trouble concentrating. Feeling irritable, anxious, or depressed. How is this diagnosed? This condition  may be diagnosed based on: Your symptoms and medical history. Your health care provider may ask about: Your sleep habits. Any medical conditions you have. Your mental health. A physical exam. How is this treated? Treatment for insomnia depends on the cause. Treatment may focus on treating an underlying condition that is causing the insomnia. Treatment may also include: Medicines to help you sleep. Counseling or therapy. Lifestyle adjustments to help you sleep better. Follow these instructions at home: Eating and drinking  Limit or avoid alcohol , caffeinated beverages, and products that contain nicotine and tobacco, especially close to bedtime. These can disrupt your sleep. Do not eat a large meal or eat spicy foods right before bedtime. This can lead to digestive discomfort that can make it hard for you to sleep. Sleep habits  Keep a sleep diary to help you and your health care provider figure out what could be causing your insomnia. Write down: When you sleep. When you wake up during the night. How well you sleep and how rested you feel the next day. Any side effects of medicines you are taking. What you eat and drink. Make your bedroom a dark, comfortable place where it is easy to fall asleep. Put up shades or blackout curtains to block light from outside. Use a white noise machine to block noise. Keep the temperature cool. Limit screen use before bedtime. This includes: Not watching TV. Not using your smartphone, tablet, or computer. Stick to a routine that includes going to bed and waking up at the same times every day and night. This can help you fall asleep faster. Consider making a quiet activity, such as reading, part of your nighttime routine. Try to avoid taking naps during the day so that you sleep better at night. Get out of bed if you are still awake after  15 minutes of trying to sleep. Keep the lights down, but try reading or doing a quiet activity. When you feel  sleepy, go back to bed. General instructions Take over-the-counter and prescription medicines only as told by your health care provider. Exercise regularly as told by your health care provider. However, avoid exercising in the hours right before bedtime. Use relaxation techniques to manage stress. Ask your health care provider to suggest some techniques that may work well for you. These may include: Breathing exercises. Routines to release muscle tension. Visualizing peaceful scenes. Make sure that you drive carefully. Do not drive if you feel very sleepy. Keep all follow-up visits. This is important. Contact a health care provider if: You are tired throughout the day. You have trouble in your daily routine due to sleepiness. You continue to have sleep problems, or your sleep problems get worse. Get help right away if: You have thoughts about hurting yourself or someone else. Get help right away if you feel like you may hurt yourself or others, or have thoughts about taking your own life. Go to your nearest emergency room or: Call 911. Call the National Suicide Prevention Lifeline at 2232757840 or 988. This is open 24 hours a day. Text the Crisis Text Line at 657-529-4371. Summary Insomnia is a sleep disorder that makes it difficult to fall asleep or stay asleep. Insomnia can be long-term (chronic) or short-term (acute). Treatment for insomnia depends on the cause. Treatment may focus on treating an underlying condition that is causing the insomnia. Keep a sleep diary to help you and your health care provider figure out what could be causing your insomnia. This information is not intended to replace advice given to you by your health care provider. Make sure you discuss any questions you have with your health care provider. Document Revised: 01/26/2021 Document Reviewed: 01/26/2021 Elsevier Patient Education  2024 [employer].

## 2023-04-06 NOTE — Progress Notes (Signed)
 Subjective:  Patient ID: Denise Morgan, female    DOB: 01-14-1966  Age: 58 y.o. MRN: [id number]  CC: Osteoarthritis, Hypothyroidism, and Hypertension   HPI Denise Morgan presents for f/up -----  Discussed the use of AI scribe software for clinical note transcription with the patient, who gave verbal consent to proceed.  History of Present Illness   Denise Morgan is a 58 year old female who presents with hip pain and sleep disturbances.  She experiences persistent pain and swelling in her hips, with the right hip currently more problematic than the left. She feels 'off level' and perceives a leg length discrepancy. No chest pain, shortness of breath, dizziness, lightheadedness, fever, or chills. She is on pain medication, which provides limited relief.  She has sleep disturbances and takes trazodone  200 mg, which is insufficient as she is 'up and down' during the night. She has been taking two pills instead of one, causing her prescription to run out sooner. She has also been taking her mother's supply of Seroquel  300 mg at HS.  She has a history of low heart rate noted during a previous hospital stay. She does not recall if her heart rate was low during her last EKG. No weakness, dizziness, or lightheadedness.  She has completed her course of Xarelto , naproxen, Aleve, doxycycline , and mupirocin . She takes methocarbamol  as needed for muscle spasms and has switched from Colace to Micromania for constipation.  She received a flu shot prior to her surgery and is unsure about her last tetanus shot, which was approximately ten years ago. She is due for a pneumonia vaccine and a colonoscopy, the latter of which was last done seven years ago with findings of polyps. She acknowledges the need for lung screening as she is trying to quit smoking.       Outpatient Medications Prior to Visit  Medication Sig Dispense Refill   Albuterol -Budesonide  (AIRSUPRA ) 90-80 MCG/ACT AERO Inhale 2  puffs into the lungs 3 (three) times daily as needed (shortness of breath).     docusate sodium  (COLACE) 100 MG capsule Take 1 capsule (100 mg total) by mouth daily as needed. 30 capsule 2   fluticasone  (FLONASE ) 50 MCG/ACT nasal spray Place 2 sprays into both nostrils daily. 48 g 1   indapamide  (LOZOL ) 2.5 MG tablet TAKE 1 TABLET BY MOUTH EVERY DAY 90 tablet 0   levothyroxine  (SYNTHROID ) 50 MCG tablet TAKE 1 TABLET BY MOUTH EVERY DAY BEFORE BREAKFAST 90 tablet 1   methocarbamol  (ROBAXIN -750) 750 MG tablet Take 1 tablet (750 mg total) by mouth 2 (two) times daily as needed for muscle spasms. 20 tablet 2   nadolol  (CORGARD ) 20 MG tablet TAKE 1 TABLET BY MOUTH EVERY DAY 90 tablet 0   naproxen sodium (ALEVE) 220 MG tablet Take 440 mg by mouth 2 (two) times daily as needed (pain).     potassium chloride  (KLOR-CON ) 8 MEQ tablet TAKE 1 TABLET (8 MEQ TOTAL) BY MOUTH 3 (THREE) TIMES DAILY. 270 tablet 0   traZODone  (DESYREL ) 100 MG tablet TAKE 2 TABLETS BY MOUTH AT BEDTIME 60 tablet 2   oxyCODONE  (ROXICODONE ) 5 MG immediate release tablet Take 1 tablet (5 mg total) by mouth every 8 (eight) hours as needed for severe pain (pain score 7-10). (Patient not taking: Reported on 04/06/2023) 90 tablet 0   doxycycline  (VIBRAMYCIN ) 100 MG capsule Take 1 capsule (100 mg total) by mouth 2 (two) times daily. (Patient not taking: Reported on 04/06/2023) 20 capsule 0  mupirocin  ointment (BACTROBAN ) 2 % Apply 1 Application topically 2 (two) times daily. Apply twice daily and cover with a band-aid until healed (Patient not taking: Reported on 04/06/2023) 22 g 0   rivaroxaban  (XARELTO ) 10 MG TABS tablet Take 1 tablet (10 mg total) by mouth daily. To be taken after surgery to prevent blood clots (Patient not taking: Reported on 04/06/2023) 30 tablet 0   No facility-administered medications prior to visit.    ROS Review of Systems  Constitutional:  Negative for appetite change, diaphoresis, fatigue and unexpected weight change.   HENT: Negative.    Eyes: Negative.   Respiratory: Negative.  Negative for cough, chest tightness, shortness of breath and wheezing.   Cardiovascular:  Negative for chest pain, palpitations and leg swelling.  Gastrointestinal: Negative.  Negative for abdominal pain, constipation, diarrhea, nausea and vomiting.  Endocrine: Negative.   Genitourinary: Negative.  Negative for difficulty urinating.  Musculoskeletal:  Positive for arthralgias and gait problem. Negative for back pain, myalgias and neck pain.  Skin: Negative.   Neurological:  Negative for dizziness, weakness and light-headedness.  Hematological:  Negative for adenopathy. Does not bruise/bleed easily.  Psychiatric/Behavioral:  Positive for sleep disturbance. Negative for confusion, decreased concentration, dysphoric mood, hallucinations and suicidal ideas.     Objective:  BP 132/88 (BP Location: Left Arm, Patient Position: Sitting, Cuff Size: Normal)   Pulse (!) 53   Temp 98 F (36.7 C) (Oral)   Ht 5' 6 (1.676 m)   Wt 226 lb (102.5 kg)   LMP 03/01/2004   SpO2 98%   BMI 36.48 kg/m   BP Readings from Last 3 Encounters:  04/06/23 132/88  03/21/23 127/79  02/23/23 130/69    Wt Readings from Last 3 Encounters:  04/06/23 226 lb (102.5 kg)  02/15/23 242 lb 14.4 oz (110.2 kg)  01/04/23 234 lb (106.1 kg)    Physical Exam Vitals reviewed.  Constitutional:      Appearance: She is obese.  HENT:     Nose: Nose normal.     Mouth/Throat:     Mouth: Mucous membranes are moist.  Eyes:     General: No scleral icterus.    Conjunctiva/sclera: Conjunctivae normal.  Cardiovascular:     Rate and Rhythm: Regular rhythm. Bradycardia present.     Pulses: Normal pulses.     Heart sounds: No murmur heard.    No friction rub. No gallop.  Pulmonary:     Effort: Pulmonary effort is normal.     Breath sounds: No stridor. No wheezing, rhonchi or rales.  Abdominal:     General: Abdomen is flat.     Palpations: There is no mass.      Tenderness: There is no abdominal tenderness. There is no guarding.     Hernia: No hernia is present.  Musculoskeletal:        General: Normal range of motion.     Cervical back: Neck supple.     Right lower leg: No edema.     Left lower leg: No edema.  Skin:    General: Skin is warm and dry.  Neurological:     General: No focal deficit present.     Mental Status: She is alert.  Psychiatric:        Mood and Affect: Mood normal.        Behavior: Behavior normal.     Lab Results  Component Value Date   WBC 4.8 02/15/2023   HGB 12.9 02/15/2023   HCT 37.7 02/15/2023  PLT 214 02/15/2023   GLUCOSE 91 02/15/2023   CHOL 272 (H) 05/13/2022   TRIG 70.0 05/13/2022   HDL 99.10 05/13/2022   LDLDIRECT 155.6 02/16/2013   LDLCALC 159 (H) 05/13/2022   ALT 18 05/13/2022   AST 18 05/13/2022   NA 133 (L) 02/15/2023   K 4.1 02/15/2023   CL 103 02/15/2023   CREATININE 0.76 02/15/2023   BUN 17 02/15/2023   CO2 21 (L) 02/15/2023   TSH 1.41 01/04/2023   INR 3.2 11/24/2012    DG Pelvis Portable Result Date: 02/21/2023 CLINICAL DATA:  Postop. EXAM: PORTABLE PELVIS 1-2 VIEWS COMPARISON:  None Available. FINDINGS: Right hip arthroplasty in expected alignment. No periprosthetic lucency or fracture. Recent postsurgical change includes air and edema in the soft tissues. IMPRESSION: Right hip arthroplasty without immediate postoperative complication. Electronically Signed   By: Andrea Gasman M.D.   On: 02/21/2023 15:12   DG HIP UNILAT WITH PELVIS 1V RIGHT Result Date: 02/21/2023 CLINICAL DATA:  Elective surgery. EXAM: DG HIP (WITH OR WITHOUT PELVIS) 1V RIGHT COMPARISON:  None Available. FINDINGS: Two fluoroscopic spot views of the pelvis and right hip obtained in the operating room. Images during hip arthroplasty. Fluoroscopy time 33 seconds. Dose 4.69 mGy. IMPRESSION: Intraoperative fluoroscopy during right hip arthroplasty. Electronically Signed   By: Andrea Gasman M.D.   On: 02/21/2023  15:12   DG C-Arm 1-60 Min-No Report Result Date: 02/21/2023 Fluoroscopy was utilized by the requesting physician.  No radiographic interpretation.   DG C-Arm 1-60 Min-No Report Result Date: 02/21/2023 Fluoroscopy was utilized by the requesting physician.  No radiographic interpretation.    Assessment & Plan:  Tobacco abuse disorder -     Ambulatory Referral for Lung Cancer Scre  Hyperplastic colonic polyp, unspecified part of colon -     Ambulatory referral to Gastroenterology  Psychophysiological insomnia -     traZODone  HCl; Take 2 tablets (200 mg total) by mouth at bedtime.  Dispense: 180 tablet; Refill: 0 -     QUEtiapine  Fumarate; Take 1 tablet (50 mg total) by mouth at bedtime.  Dispense: 90 tablet; Refill: 0  Chronic pain syndrome -     traZODone  HCl; Take 2 tablets (200 mg total) by mouth at bedtime.  Dispense: 180 tablet; Refill: 0  Need for vaccination -     Pneumococcal conjugate vaccine 20-valent  Bradycardia- She is asymptomatic with this.  Other orders -     Tdap vaccine greater than or equal to 7yo IM     Follow-up: Return in about 3 months (around 07/04/2023).  Debby Molt, MD

## 2023-04-07 ENCOUNTER — Other Ambulatory Visit (INDEPENDENT_AMBULATORY_CARE_PROVIDER_SITE_OTHER): Payer: 59

## 2023-04-07 ENCOUNTER — Ambulatory Visit (INDEPENDENT_AMBULATORY_CARE_PROVIDER_SITE_OTHER): Payer: 59 | Admitting: Physician Assistant

## 2023-04-07 DIAGNOSIS — Z96641 Presence of right artificial hip joint: Secondary | ICD-10-CM

## 2023-04-07 NOTE — Progress Notes (Signed)
 Post-Op Visit Note   Patient: Denise Morgan           Date of Birth: 1966/02/10           MRN: [id number] Visit Date: 04/07/2023 PCP: Joshua Debby CROME, MD   Assessment & Plan:  Chief Complaint:  Chief Complaint  Patient presents with   Right Hip - Follow-up    Right total hip arthroplasty 02/21/2023   Visit Diagnoses:  1. Status post total replacement of right hip     Plan: Patient is a pleasant 58 year old female who comes in today 6 weeks status post right total hip replacement 02/21/2023.  She has been in a fair amount of pain since surgery.  She is taking oxycodone  for which she takes chronically for spine pain.  Currently ambulating with a walker.  She has finished her Xarelto  for which she was taking for DVT prophylaxis.  Examination of the right hip reveals a fully healed surgical scar without complication.  Painless hip flexion and logroll.  Calf soft nontender.  She is neurovascularly intact distally.  At this point, she will continue working on range of motion exercises.  She will wean to a cane when she is ready.  Follow-up with us  in 6 weeks for recheck.  Dental prophylaxis reinforced.  Call with concerns or questions.  Follow-Up Instructions: Return in about 6 weeks (around 05/19/2023).   Orders:  Orders Placed This Encounter  Procedures   XR Pelvis 1-2 Views   No orders of the defined types were placed in this encounter.   Imaging: XR Pelvis 1-2 Views Result Date: 04/07/2023 Well-seated prosthesis without complication   PMFS History: Patient Active Problem List   Diagnosis Date Noted   Pseudopolyposis of colon (HCC) 04/06/2023   Hyperplastic colonic polyp 04/06/2023   Psychophysiological insomnia 04/06/2023   Encounter for general adult medical examination with abnormal findings 01/04/2023   Prolonged Q-T interval on ECG 01/04/2023   Seasonal allergic rhinitis due to fungal spores 04/05/2022   Moderate persistent asthma with exacerbation 04/05/2022    Encounter for long-term use of opiate analgesic 02/16/2020   Acquired hypothyroidism 02/12/2020   Essential hypertension 04/07/2016   Snoring 04/07/2016   Hx of adenomatous polyp of colon 08/15/2015   Asthma, mild intermittent 05/29/2015   Hyperlipidemia with target LDL less than 130 05/29/2015   Subacromial bursitis 04/18/2014   Obesity, morbid (HCC) 02/16/2013   Primary osteoarthritis of right hip 02/16/2013   Tobacco abuse disorder 11/19/2012   Visit for screening mammogram 11/17/2012   DJD (degenerative joint disease) of knee 11/17/2012   Chronic pain syndrome 11/17/2012   Past Medical History:  Diagnosis Date   Allergy    Anemia    15+ years ago   Arthritis    Asthma    Blood transfusion without reported diagnosis    Depression    Dysrhythmia    Palpitations - Holter monitor in 2024   Family history of adverse reaction to anesthesia    sister had episode with anesthesia once, but no additional information given   Frequent headaches    Resolved as of 2024   History of phlebitis    Hx of adenomatous polyp of colon 08/15/2015   Hypertension    Hypothyroidism    Palpitations    heart monitor all normal per pt.2017   PE (pulmonary embolism)     Family History  Problem Relation Age of Onset   Colon polyps Mother    Ovarian cancer Mother  Arthritis Mother    Colon cancer Maternal Aunt    Esophageal cancer Neg Hx    Rectal cancer Neg Hx    Stomach cancer Neg Hx     Past Surgical History:  Procedure Laterality Date   ABDOMINAL HYSTERECTOMY  03/01/2004   CHOLECYSTECTOMY     COLONOSCOPY W/ POLYPECTOMY  07/2020   TOTAL HIP ARTHROPLASTY Right 02/21/2023   Procedure: RIGHT TOTAL HIP ARTHROPLASTY ANTERIOR APPROACH;  Surgeon: Jerri Kay HERO, MD;  Location: [facility] OR;  Service: Orthopedics;  Laterality: Right;  3-C   Social History   Occupational History   Not on file  Tobacco Use   Smoking status: Every Day    Current packs/day: 0.75    Average packs/day: 0.8  packs/day for 33.0 years (24.8 ttl pk-yrs)    Types: Cigarettes   Smokeless tobacco: Never  Vaping Use   Vaping status: Never Used  Substance and Sexual Activity   Alcohol use: Not Currently    Comment: 2-3x/week - wine   Drug use: No   Sexual activity: Not Currently    Birth control/protection: Surgical

## 2023-04-11 ENCOUNTER — Telehealth: Payer: Self-pay

## 2023-04-11 NOTE — Telephone Encounter (Signed)
 LVM. Advised insurance request CT be scheduled at a hospital location instead of [facility]. Scan rescheduled at [facility] on the same day 04/27/2023 at 11:30am. Request patient call back if she needs to reschedule.

## 2023-04-12 DIAGNOSIS — Z23 Encounter for immunization: Secondary | ICD-10-CM | POA: Insufficient documentation

## 2023-04-12 DIAGNOSIS — R001 Bradycardia, unspecified: Secondary | ICD-10-CM | POA: Insufficient documentation

## 2023-04-25 ENCOUNTER — Other Ambulatory Visit: Payer: Self-pay | Admitting: Internal Medicine

## 2023-04-25 DIAGNOSIS — M160 Bilateral primary osteoarthritis of hip: Secondary | ICD-10-CM

## 2023-04-25 DIAGNOSIS — G894 Chronic pain syndrome: Secondary | ICD-10-CM

## 2023-04-25 DIAGNOSIS — M170 Bilateral primary osteoarthritis of knee: Secondary | ICD-10-CM

## 2023-04-27 ENCOUNTER — Other Ambulatory Visit: Payer: 59

## 2023-04-27 ENCOUNTER — Ambulatory Visit (HOSPITAL_COMMUNITY)
Admission: RE | Admit: 2023-04-27 | Discharge: 2023-04-27 | Disposition: A | Discharge status: Home / Self Care | Payer: 59 | Source: Ambulatory Visit | Attending: Internal Medicine | Admitting: Internal Medicine

## 2023-04-27 DIAGNOSIS — Z87891 Personal history of nicotine dependence: Secondary | ICD-10-CM | POA: Diagnosis present

## 2023-04-27 DIAGNOSIS — F17210 Nicotine dependence, cigarettes, uncomplicated: Secondary | ICD-10-CM | POA: Diagnosis present

## 2023-04-27 DIAGNOSIS — Z122 Encounter for screening for malignant neoplasm of respiratory organs: Secondary | ICD-10-CM | POA: Diagnosis present

## 2023-04-28 MED ORDER — OXYCODONE HCL 5 MG PO TABS: 5.0000 mg | ORAL_TABLET | Freq: Three times a day (TID) | ORAL | 0 refills | Status: DC | PRN

## 2023-04-30 ENCOUNTER — Other Ambulatory Visit: Payer: Self-pay | Admitting: Internal Medicine

## 2023-04-30 DIAGNOSIS — E876 Hypokalemia: Secondary | ICD-10-CM

## 2023-04-30 DIAGNOSIS — I10 Essential (primary) hypertension: Secondary | ICD-10-CM

## 2023-05-19 ENCOUNTER — Ambulatory Visit: Payer: 59 | Admitting: Physician Assistant

## 2023-05-19 DIAGNOSIS — Z96641 Presence of right artificial hip joint: Secondary | ICD-10-CM

## 2023-05-19 MED ORDER — AMOXICILLIN 500 MG PO CAPS: ORAL_CAPSULE | ORAL | 2 refills | Status: DC

## 2023-05-19 NOTE — Progress Notes (Signed)
 Post-Op Visit Note   Patient: Denise Morgan           Date of Birth: Nov 30, 1965           MRN: [id number] Visit Date: 05/19/2023 PCP: Etta Grandchild, MD   Assessment & Plan:  Chief Complaint:  Chief Complaint  Patient presents with   Right Hip - Routine Post Op, Follow-up   Visit Diagnoses:  1. Status post total replacement of right hip     Plan: Patient is a pleasant 58 year old female who comes in today 3 months status post right total hip replacement.  She has been doing okay.  She is still in some pain but notes that this has slowly improved over time.  She is on chronic oxycodone.  Examination of the right hip reveals minimal pain with hip flexion and logroll.  Mild swelling to the lateral hip but no evidence of seroma.  Fully healed surgical scar without complication.  Calf is soft nontender.  She is neurovascularly intact distally.  At this point, she will continue to advance with activity as tolerated.  Dental prophylaxis reinforced.  She does tell me she thinks she has a tooth abscess since yesterday so I recommended that she follow-up with dentist today.  I sent in antibiotics to take prophylactically for her dental appointment.  She will follow-up with [place] in 3 months for repeat evaluation and AP pelvis x-rays.  Call with concerns or questions.  Follow-Up Instructions: Return in about 3 months (around 08/19/2023).   Orders:  No orders of the defined types were placed in this encounter.  No orders of the defined types were placed in this encounter.   Imaging: No new imaging  PMFS History: Patient Active Problem List   Diagnosis Date Noted   Need for vaccination 04/12/2023   Bradycardia 04/12/2023   Hyperplastic colonic polyp 04/06/2023   Psychophysiological insomnia 04/06/2023   Encounter for general adult medical examination with abnormal findings 01/04/2023   Prolonged Q-T interval on ECG 01/04/2023   Seasonal allergic rhinitis due to fungal spores  04/05/2022   Moderate persistent asthma with exacerbation 04/05/2022   Encounter for long-term use of opiate analgesic 02/16/2020   Acquired hypothyroidism 02/12/2020   Essential hypertension 04/07/2016   Snoring 04/07/2016   Hx of adenomatous polyp of colon 08/15/2015   Asthma, mild intermittent 05/29/2015   Hyperlipidemia with target LDL less than 130 05/29/2015   Subacromial bursitis 04/18/2014   Obesity, morbid (HCC) 02/16/2013   Primary osteoarthritis of right hip 02/16/2013   Tobacco abuse disorder 11/19/2012   Visit for screening mammogram 11/17/2012   DJD (degenerative joint disease) of knee 11/17/2012   Chronic pain syndrome 11/17/2012   Past Medical History:  Diagnosis Date   Allergy    Anemia    15+ years ago   Arthritis    Asthma    Blood transfusion without reported diagnosis    Depression    Dysrhythmia    Palpitations - Holter monitor in 2024   Family history of adverse reaction to anesthesia    sister had episode with anesthesia once, but no additional information given   Frequent headaches    Resolved as of 2024   History of phlebitis    Hx of adenomatous polyp of colon 08/15/2015   Hypertension    Hypothyroidism    Palpitations    heart monitor all normal per pt.2017   PE (pulmonary embolism)     Family History  Problem Relation Age of Onset  Colon polyps Mother    Ovarian cancer Mother    Arthritis Mother    Colon cancer Maternal Aunt    Esophageal cancer Neg Hx    Rectal cancer Neg Hx    Stomach cancer Neg Hx     Past Surgical History:  Procedure Laterality Date   ABDOMINAL HYSTERECTOMY  03/01/2004   CHOLECYSTECTOMY     COLONOSCOPY W/ POLYPECTOMY  07/2020   TOTAL HIP ARTHROPLASTY Right 02/21/2023   Procedure: RIGHT TOTAL HIP ARTHROPLASTY ANTERIOR APPROACH;  Surgeon: Tarry Kos, MD;  Location: [facility] OR;  Service: Orthopedics;  Laterality: Right;  3-C   Social History   Occupational History   Not on file  Tobacco Use   Smoking status:  Every Day    Current packs/day: 0.75    Average packs/day: 0.8 packs/day for 33.0 years (24.8 ttl pk-yrs)    Types: Cigarettes   Smokeless tobacco: Never  Vaping Use   Vaping status: Never Used  Substance and Sexual Activity   Alcohol use: Not Currently    Comment: 2-3x/week - wine   Drug use: No   Sexual activity: Not Currently    Birth control/protection: Surgical

## 2023-05-20 ENCOUNTER — Ambulatory Visit (INDEPENDENT_AMBULATORY_CARE_PROVIDER_SITE_OTHER): Admitting: Family Medicine

## 2023-05-20 ENCOUNTER — Encounter: Payer: Self-pay | Admitting: Family Medicine

## 2023-05-20 ENCOUNTER — Telehealth: Payer: Self-pay | Admitting: Internal Medicine

## 2023-05-20 VITALS — BP 122/82 | HR 62 | Temp 97.8°F | Ht 66.0 in | Wt 239.0 lb

## 2023-05-20 DIAGNOSIS — K0889 Other specified disorders of teeth and supporting structures: Secondary | ICD-10-CM | POA: Diagnosis not present

## 2023-05-20 DIAGNOSIS — K047 Periapical abscess without sinus: Secondary | ICD-10-CM

## 2023-05-20 MED ORDER — OXYCODONE-ACETAMINOPHEN 10-325 MG PO TABS: 1.0000 | ORAL_TABLET | Freq: Three times a day (TID) | ORAL | 0 refills | Status: AC | PRN

## 2023-05-20 MED ORDER — AMOXICILLIN-POT CLAVULANATE 875-125 MG PO TABS: 1.0000 | ORAL_TABLET | Freq: Two times a day (BID) | ORAL | 0 refills | Status: DC

## 2023-05-20 MED ORDER — KETOROLAC TROMETHAMINE 60 MG/2ML IM SOLN
60.0000 mg | Freq: Once | INTRAMUSCULAR | Status: AC
Start: 2023-05-20 — End: 2023-05-20
  Administered 2023-05-20: 60 mg via INTRAMUSCULAR

## 2023-05-20 NOTE — Patient Instructions (Addendum)
 You have received an injection of Toradol in office today for pain.  Do not take any NSAIDs including aspirin, Aleve, ibuprofen, Celebrex, meloxicam within the next 6 hours after your injection.  I have sent in Augmentin for you to take twice a day for 10 days.  This medication can upset your stomach, so I tell everyone to take it with a meal.  I have sent in a short script for oxycodone-acetaminophen 10-325 for you to take one tablet every 6 hours as needed for breakthrough dental pain  Please follow up with dentistry  Follow-up with me for new or worsening symptoms.

## 2023-05-20 NOTE — Progress Notes (Signed)
   Acute Office Visit  Subjective:     Patient ID: Denise Morgan, female    DOB: 1965-06-24, 58 y.o.   MRN: [id number]  Chief Complaint  Patient presents with   Acute Visit    Noticed about 2 days, right side of mouth if swollen    HPI Patient is in today for evaluation of right lower dental pain since yesterday.  States that she woke up like that yesterday morning. Reports that she has a tooth missing on the right lower jaw, thinks that this is infected. Does not have current dentist. Reports right-sided facial swelling, heat, tenderness. Has not attempted OTC treatment today. Denies abdominal pain, nausea, med, diarrhea, rash, fever, chills, other symptoms. Medical history as outlined below.  ROS Per HPI      Objective:    BP 122/82 (BP Location: Left Arm, Patient Position: Sitting)   Pulse 62   Temp 97.8 F (36.6 C) (Temporal)   Ht 5\' 6"  (1.676 m)   Wt 239 lb (108.4 kg)   LMP 03/01/2004   SpO2 97%   BMI 38.58 kg/m    Physical Exam Constitutional:      General: She is not in acute distress.    Appearance: She is obese.  HENT:     Head:      Comments: Area of tenderness, heat, swelling    Mouth/Throat:      Comments: Areas of missing tooth, erythematous, tender gingiva. No bleeding, no discharge, no abscess noted.  Musculoskeletal:     Cervical back: Normal range of motion.  Lymphadenopathy:     Cervical: No cervical adenopathy.  Skin:    General: Skin is warm and dry.  Neurological:     General: No focal deficit present.     Mental Status: She is alert and oriented to person, place, and time.     No results found for any visits on 05/20/23.      Assessment & Plan:   Dental infection -     Amoxicillin-Pot Clavulanate; Take 1 tablet by mouth 2 (two) times daily.  Dispense: 20 tablet; Refill: 0  Pain, dental -     oxyCODONE-Acetaminophen; Take 1 tablet by mouth every 8 (eight) hours as needed for up to 5 days for pain.  Dispense: 15 tablet;  Refill: 0 -     Ketorolac Tromethamine     Meds ordered this encounter  Medications   amoxicillin-clavulanate (AUGMENTIN) 875-125 MG tablet    Sig: Take 1 tablet by mouth 2 (two) times daily.    Dispense:  20 tablet    Refill:  0   oxyCODONE-acetaminophen (PERCOCET) 10-325 MG tablet    Sig: Take 1 tablet by mouth every 8 (eight) hours as needed for up to 5 days for pain.    Dispense:  15 tablet    Refill:  0   ketorolac (TORADOL) injection 60 mg    Return if symptoms worsen or fail to improve, for with dentist.  Moshe Cipro, FNP

## 2023-05-20 NOTE — Telephone Encounter (Signed)
 Copied from CRM [id number]. Topic: Clinical - Prescription Issue >> May 20, 2023 11:27 AM Mackie Pai E wrote: Reason for CRM: Patient called in stating that she just was at the clinic today 3/21 for an appointment. At this appointment she got prescribed Amoxicillin and a pain medication. Patient called her pharmacy and they stated they do not have this medication. Patient questioning if it has been sent over yet. Callback number for patient is [phone].

## 2023-05-23 ENCOUNTER — Other Ambulatory Visit: Payer: Self-pay

## 2023-05-23 DIAGNOSIS — F17210 Nicotine dependence, cigarettes, uncomplicated: Secondary | ICD-10-CM

## 2023-05-23 DIAGNOSIS — Z122 Encounter for screening for malignant neoplasm of respiratory organs: Secondary | ICD-10-CM

## 2023-05-23 DIAGNOSIS — Z87891 Personal history of nicotine dependence: Secondary | ICD-10-CM

## 2023-05-23 NOTE — Telephone Encounter (Signed)
 Medication was sent on 3/21. I called patient and left a very detailed message about her medication being sent to her pharmacy on Friday.

## 2023-06-02 ENCOUNTER — Other Ambulatory Visit: Payer: Self-pay | Admitting: Internal Medicine

## 2023-06-02 DIAGNOSIS — G894 Chronic pain syndrome: Secondary | ICD-10-CM

## 2023-06-02 DIAGNOSIS — M160 Bilateral primary osteoarthritis of hip: Secondary | ICD-10-CM

## 2023-06-02 DIAGNOSIS — M170 Bilateral primary osteoarthritis of knee: Secondary | ICD-10-CM

## 2023-06-02 MED ORDER — OXYCODONE HCL 5 MG PO TABS: 5.0000 mg | ORAL_TABLET | Freq: Three times a day (TID) | ORAL | 0 refills | Status: DC | PRN

## 2023-06-20 ENCOUNTER — Telehealth: Payer: Self-pay | Admitting: Orthopaedic Surgery

## 2023-06-20 NOTE — Telephone Encounter (Signed)
 Sure.  Next available.  Thanks.

## 2023-06-20 NOTE — Telephone Encounter (Signed)
 Called and scheduled patient

## 2023-06-20 NOTE — Telephone Encounter (Signed)
 Patient called and said that since her surgery in December she is still in a lot of pain. Her right hip is swollen. CB#[phone]

## 2023-06-24 ENCOUNTER — Other Ambulatory Visit (INDEPENDENT_AMBULATORY_CARE_PROVIDER_SITE_OTHER): Payer: Self-pay

## 2023-06-24 ENCOUNTER — Other Ambulatory Visit: Payer: Self-pay | Admitting: Orthopaedic Surgery

## 2023-06-24 ENCOUNTER — Other Ambulatory Visit: Payer: Self-pay | Admitting: Internal Medicine

## 2023-06-24 ENCOUNTER — Encounter: Payer: Self-pay | Admitting: Orthopaedic Surgery

## 2023-06-24 ENCOUNTER — Ambulatory Visit: Admitting: Orthopaedic Surgery

## 2023-06-24 DIAGNOSIS — M545 Low back pain, unspecified: Secondary | ICD-10-CM

## 2023-06-24 DIAGNOSIS — M25551 Pain in right hip: Secondary | ICD-10-CM | POA: Diagnosis not present

## 2023-06-24 DIAGNOSIS — M25552 Pain in left hip: Secondary | ICD-10-CM

## 2023-06-24 DIAGNOSIS — G8929 Other chronic pain: Secondary | ICD-10-CM

## 2023-06-24 DIAGNOSIS — Z96641 Presence of right artificial hip joint: Secondary | ICD-10-CM

## 2023-06-24 DIAGNOSIS — M160 Bilateral primary osteoarthritis of hip: Secondary | ICD-10-CM

## 2023-06-24 DIAGNOSIS — F5104 Psychophysiologic insomnia: Secondary | ICD-10-CM

## 2023-06-24 DIAGNOSIS — M170 Bilateral primary osteoarthritis of knee: Secondary | ICD-10-CM

## 2023-06-24 DIAGNOSIS — G894 Chronic pain syndrome: Secondary | ICD-10-CM

## 2023-06-24 MED ORDER — PREDNISONE 10 MG (21) PO TBPK: ORAL_TABLET | ORAL | 3 refills | Status: DC

## 2023-06-24 NOTE — Progress Notes (Signed)
 Office Visit Note   Patient: Denise Morgan           Date of Birth: 20-Sep-1965           MRN: [id number] Visit Date: 06/24/2023              Requested by: Arcadio Knuckles, MD [address] [place],  [place] [address] PCP: Arcadio Knuckles, MD   Assessment & Plan: Visit Diagnoses:  1. Bilateral hip pain   2. Status post total replacement of right hip   3. Chronic bilateral low back pain without sciatica     Plan: Assessment and Plan    Bilateral hip pain post-replacement Four months post-hip replacement with persistent bilateral hip pain. No infection or surgical complications. X-ray normal for replaced hip; contralateral hip arthritic. Discussed opioid risks and non-opioid pain management. - Order blood work to rule out rheumatoid arthritis or other conditions. - Prescribe prednisone  to reduce inflammation. - Advise on opioid risks and addiction potential.  Chronic pain due to arthritis Chronic pain from arthritis affecting multiple joints, consistent with polyarthralgia. - Prescribe prednisone  to reduce inflammation. - Refer to physical therapy for bilateral hip pain.  Low back pain Chronic low back pain with radiating sharp pains. Suspected contribution to chronic pain syndrome. - Order x-ray of the back. - Refer to physical therapy for low back pain. - Consider MRI if no improvement after six weeks of physical therapy.      Follow-Up Instructions: No follow-ups on file.   Orders:  Orders Placed This Encounter  Procedures   XR HIPS BILAT W OR W/O PELVIS 3-4 VIEWS   XR Lumbar Spine 2-3 Views   Uric acid   Sedimentation rate   ANA   Rheumatoid Factor   Ambulatory referral to Physical Therapy   Meds ordered this encounter  Medications   predniSONE  (STERAPRED UNI-PAK 21 TAB) 10 MG (21) TBPK tablet    Sig: Take as directed    Dispense:  21 tablet    Refill:  3      Procedures: No procedures performed   Clinical Data: No additional  findings.   Subjective: Chief Complaint  Patient presents with   Right Hip - Follow-up    02/21/23 s/p right total hip arthroplasty   Left Hip - Pain    HPI Discussed the use of AI scribe software for clinical note transcription with the patient, who gave verbal consent to proceed.  History of Present Illness   The patient, with a history of bilateral hip arthritis and recent hip replacement, presents with persistent bilateral hip pain. She reports that the pain is exacerbated by both movement and prolonged sitting. The pain is described as 'excruciating' and is associated with stiffness, particularly upon waking. The patient also reports swelling in the hip that was not replaced. She has a history of back issues and describes sharp pains radiating from the back down the legs. The patient also reports generalized joint pain, including in the hands and knees, and states that her joints 'lock up.' She has been diagnosed with osteoporosis and arthritis.     Review of Systems  Constitutional: Negative.   HENT: Negative.    Eyes: Negative.   Respiratory: Negative.    Cardiovascular: Negative.   Endocrine: Negative.   Musculoskeletal: Negative.   Neurological: Negative.   Hematological: Negative.   Psychiatric/Behavioral: Negative.    All other systems reviewed and are negative.    Objective: Vital Signs: LMP 03/01/2004  Physical Exam Vitals and nursing note reviewed.  Constitutional:      Appearance: She is well-developed.  HENT:     Head: Atraumatic.     Nose: Nose normal.  Eyes:     Extraocular Movements: Extraocular movements intact.  Cardiovascular:     Pulses: Normal pulses.  Pulmonary:     Effort: Pulmonary effort is normal.  Abdominal:     Palpations: Abdomen is soft.  Musculoskeletal:     Cervical back: Neck supple.  Skin:    General: Skin is warm.     Capillary Refill: Capillary refill takes less than 2 seconds.  Neurological:     Mental Status: She is  alert. Mental status is at baseline.  Psychiatric:        Behavior: Behavior normal.        Thought Content: Thought content normal.        Judgment: Judgment normal.     Ortho Exam Exam of the right hip shows fully healed surgical scar.  Fluid range of motion.  She has diffuse tenderness.  No signs of infection.  She has diffuse tenderness throughout her lumbar spine and the left hip as well.  Specialty Comments:  No specialty comments available.  Imaging: XR HIPS BILAT W OR W/O PELVIS 3-4 VIEWS Result Date: 06/24/2023 X-rays of the pelvis and the right hip show stable right total hip arthroplasty without any complications.  XR Lumbar Spine 2-3 Views Result Date: 06/24/2023 X-rays of the lumbar spine show diffuse degenerative changes.  Grade 1 spondylolisthesis of L4-5.    PMFS History: Patient Active Problem List   Diagnosis Date Noted   Need for vaccination 04/12/2023   Bradycardia 04/12/2023   Hyperplastic colonic polyp 04/06/2023   Psychophysiological insomnia 04/06/2023   Encounter for general adult medical examination with abnormal findings 01/04/2023   Prolonged Q-T interval on ECG 01/04/2023   Seasonal allergic rhinitis due to fungal spores 04/05/2022   Moderate persistent asthma with exacerbation 04/05/2022   Encounter for long-term use of opiate analgesic 02/16/2020   Acquired hypothyroidism 02/12/2020   Essential hypertension 04/07/2016   Snoring 04/07/2016   Hx of adenomatous polyp of colon 08/15/2015   Asthma, mild intermittent 05/29/2015   Hyperlipidemia with target LDL less than 130 05/29/2015   Subacromial bursitis 04/18/2014   Obesity, morbid (HCC) 02/16/2013   Primary osteoarthritis of right hip 02/16/2013   Tobacco abuse disorder 11/19/2012   Visit for screening mammogram 11/17/2012   DJD (degenerative joint disease) of knee 11/17/2012   Chronic pain syndrome 11/17/2012   Past Medical History:  Diagnosis Date   Allergy    Anemia    15+ years  ago   Arthritis    Asthma    Blood transfusion without reported diagnosis    Depression    Dysrhythmia    Palpitations - Holter monitor in 2024   Family history of adverse reaction to anesthesia    sister had episode with anesthesia once, but no additional information given   Frequent headaches    Resolved as of 2024   History of phlebitis    Hx of adenomatous polyp of colon 08/15/2015   Hypertension    Hypothyroidism    Palpitations    heart monitor all normal per pt.2017   PE (pulmonary embolism)     Family History  Problem Relation Age of Onset   Colon polyps Mother    Ovarian cancer Mother    Arthritis Mother    Colon cancer Maternal Aunt  Esophageal cancer Neg Hx    Rectal cancer Neg Hx    Stomach cancer Neg Hx     Past Surgical History:  Procedure Laterality Date   ABDOMINAL HYSTERECTOMY  03/01/2004   CHOLECYSTECTOMY     COLONOSCOPY W/ POLYPECTOMY  07/2020   TOTAL HIP ARTHROPLASTY Right 02/21/2023   Procedure: RIGHT TOTAL HIP ARTHROPLASTY ANTERIOR APPROACH;  Surgeon: Wes Hamman, MD;  Location: [facility] OR;  Service: Orthopedics;  Laterality: Right;  3-C   Social History   Occupational History   Not on file  Tobacco Use   Smoking status: Every Day    Current packs/day: 0.75    Average packs/day: 0.8 packs/day for 33.0 years (24.8 ttl pk-yrs)    Types: Cigarettes   Smokeless tobacco: Never  Vaping Use   Vaping status: Never Used  Substance and Sexual Activity   Alcohol use: Not Currently    Comment: 2-3x/week - wine   Drug use: No   Sexual activity: Not Currently    Birth control/protection: Surgical

## 2023-06-26 LAB — ANA: Anti Nuclear Antibody (ANA): POSITIVE — AB

## 2023-06-26 LAB — SEDIMENTATION RATE: Sed Rate: 14 mm/h (ref 0–30)

## 2023-06-26 LAB — RHEUMATOID FACTOR: Rheumatoid fact SerPl-aCnc: <10 [IU]/mL (ref <14)

## 2023-06-26 LAB — ANTI-NUCLEAR AB-TITER (ANA TITER): ANA Titer 1: 1:40 {titer} — ABNORMAL HIGH

## 2023-06-26 LAB — URIC ACID: Uric Acid, Serum: 7 mg/dL (ref 2.5–7.0)

## 2023-06-27 ENCOUNTER — Other Ambulatory Visit: Payer: Self-pay

## 2023-06-27 DIAGNOSIS — M25551 Pain in right hip: Secondary | ICD-10-CM

## 2023-06-27 NOTE — Progress Notes (Signed)
 Please refer to rheumatology.  Thanks.

## 2023-06-28 ENCOUNTER — Ambulatory Visit: Admitting: Orthopaedic Surgery

## 2023-06-28 ENCOUNTER — Other Ambulatory Visit: Payer: Self-pay

## 2023-06-28 ENCOUNTER — Emergency Department (HOSPITAL_COMMUNITY)
Admission: EM | Admit: 2023-06-28 | Discharge: 2023-06-28 | Disposition: A | Discharge status: Home / Self Care | Attending: Emergency Medicine | Admitting: Emergency Medicine

## 2023-06-28 ENCOUNTER — Emergency Department (HOSPITAL_COMMUNITY)

## 2023-06-28 DIAGNOSIS — R519 Headache, unspecified: Secondary | ICD-10-CM | POA: Insufficient documentation

## 2023-06-28 DIAGNOSIS — R7989 Other specified abnormal findings of blood chemistry: Secondary | ICD-10-CM | POA: Diagnosis not present

## 2023-06-28 DIAGNOSIS — E039 Hypothyroidism, unspecified: Secondary | ICD-10-CM | POA: Insufficient documentation

## 2023-06-28 DIAGNOSIS — Z96641 Presence of right artificial hip joint: Secondary | ICD-10-CM | POA: Diagnosis not present

## 2023-06-28 DIAGNOSIS — R42 Dizziness and giddiness: Secondary | ICD-10-CM | POA: Insufficient documentation

## 2023-06-28 DIAGNOSIS — J45909 Unspecified asthma, uncomplicated: Secondary | ICD-10-CM | POA: Diagnosis not present

## 2023-06-28 DIAGNOSIS — Z7951 Long term (current) use of inhaled steroids: Secondary | ICD-10-CM | POA: Diagnosis not present

## 2023-06-28 DIAGNOSIS — Z79899 Other long term (current) drug therapy: Secondary | ICD-10-CM | POA: Insufficient documentation

## 2023-06-28 DIAGNOSIS — R001 Bradycardia, unspecified: Secondary | ICD-10-CM | POA: Diagnosis not present

## 2023-06-28 DIAGNOSIS — I10 Essential (primary) hypertension: Secondary | ICD-10-CM | POA: Insufficient documentation

## 2023-06-28 DIAGNOSIS — Z91040 Latex allergy status: Secondary | ICD-10-CM | POA: Insufficient documentation

## 2023-06-28 LAB — BASIC METABOLIC PANEL WITH GFR
Anion gap: 12 (ref 5–15)
BUN: 12 mg/dL (ref 6–20)
CO2: 24 mmol/L (ref 22–32)
Calcium: 9.6 mg/dL (ref 8.9–10.3)
Chloride: 99 mmol/L (ref 98–111)
Creatinine, Ser: 0.67 mg/dL (ref 0.44–1.00)
GFR, Estimated: >60 mL/min (ref >60)
Glucose, Bld: 110 mg/dL — ABNORMAL HIGH (ref 70–99)
Potassium: 3.2 mmol/L — ABNORMAL LOW (ref 3.5–5.1)
Sodium: 135 mmol/L (ref 135–145)

## 2023-06-28 LAB — URINALYSIS, ROUTINE W REFLEX MICROSCOPIC
Bilirubin Urine: NEGATIVE
Glucose, UA: NEGATIVE mg/dL
Ketones, ur: NEGATIVE mg/dL
Leukocytes,Ua: NEGATIVE
Nitrite: NEGATIVE
Protein, ur: NEGATIVE mg/dL
Specific Gravity, Urine: 1.026 (ref 1.005–1.030)
pH: 6 (ref 5.0–8.0)

## 2023-06-28 LAB — CBC
HCT: 41.2 % (ref 36.0–46.0)
Hemoglobin: 14.4 g/dL (ref 12.0–15.0)
MCH: 33.4 pg (ref 26.0–34.0)
MCHC: 35 g/dL (ref 30.0–36.0)
MCV: 95.6 fL (ref 80.0–100.0)
Platelets: 316 10*3/uL (ref 150–400)
RBC: 4.31 MIL/uL (ref 3.87–5.11)
RDW: 12.9 % (ref 11.5–15.5)
WBC: 8.6 10*3/uL (ref 4.0–10.5)
nRBC: 0 % (ref 0.0–0.2)

## 2023-06-28 LAB — CBG MONITORING, ED: Glucose-Capillary: 119 mg/dL — ABNORMAL HIGH (ref 70–99)

## 2023-06-28 LAB — D-DIMER, QUANTITATIVE: D-Dimer, Quant: 1.05 ug{FEU}/mL — ABNORMAL HIGH (ref 0.00–0.50)

## 2023-06-28 MED ORDER — DIPHENHYDRAMINE HCL 50 MG/ML IJ SOLN
12.5000 mg | Freq: Once | INTRAMUSCULAR | Status: AC
  Administered 2023-06-28: 12.5 mg via INTRAVENOUS
  Filled 2023-06-28: qty 1

## 2023-06-28 MED ORDER — POTASSIUM CHLORIDE CRYS ER 20 MEQ PO TBCR
40.0000 meq | EXTENDED_RELEASE_TABLET | Freq: Once | ORAL | Status: AC
  Administered 2023-06-28: 40 meq via ORAL
  Filled 2023-06-28: qty 2

## 2023-06-28 MED ORDER — KETOROLAC TROMETHAMINE 15 MG/ML IJ SOLN
15.0000 mg | Freq: Once | INTRAMUSCULAR | Status: AC
  Administered 2023-06-28: 15 mg via INTRAVENOUS
  Filled 2023-06-28: qty 1

## 2023-06-28 MED ORDER — METOCLOPRAMIDE HCL 5 MG/ML IJ SOLN
10.0000 mg | Freq: Once | INTRAMUSCULAR | Status: AC
  Administered 2023-06-28: 10 mg via INTRAVENOUS
  Filled 2023-06-28: qty 2

## 2023-06-28 MED ORDER — IOHEXOL 350 MG/ML SOLN
75.0000 mL | Freq: Once | INTRAVENOUS | Status: AC | PRN
  Administered 2023-06-28: 75 mL via INTRAVENOUS

## 2023-06-28 MED ORDER — MECLIZINE HCL 25 MG PO TABS
25.0000 mg | ORAL_TABLET | Freq: Once | ORAL | Status: AC
  Administered 2023-06-28: 25 mg via ORAL
  Filled 2023-06-28: qty 1

## 2023-06-28 MED ORDER — MECLIZINE HCL 25 MG PO TABS: 25.0000 mg | ORAL_TABLET | Freq: Three times a day (TID) | ORAL | 0 refills | Status: DC | PRN

## 2023-06-28 NOTE — ED Notes (Signed)
 Pt states unable to urinate at this time.

## 2023-06-28 NOTE — Discharge Instructions (Addendum)
 You were evaluated in the emergency room for dizziness.  Your lab work and imaging did not show any significant abnormality.  I suspect that your dizziness is secondary to vertigo.

## 2023-06-28 NOTE — ED Notes (Signed)
Pt ambulatory to the restroom independently.  

## 2023-06-28 NOTE — ED Triage Notes (Signed)
 Pt. Stated, I had an episode of dizziness yesterday and all night. If I sit up or lay down I was dizzy. Ive also had a headache that started during the night . It started last night at 1145pm and continues.

## 2023-06-28 NOTE — ED Provider Notes (Signed)
 [place] EMERGENCY DEPARTMENT AT [facility] Provider Note   CSN: [id number] Arrival date & time: 06/28/23  7829     History  Chief Complaint  Patient presents with   Dizziness   Headache    Denise Morgan is a 58 y.o. female with past medical history as listed below presents with complaints of dizziness and headache for the past 2 days.  Headache was not sudden or maximal in onset.  Came on gradually and has been intermittent but worsening.  Dizziness is described as room spinning.  Seems to be worse with movement.  It is not associated with any chest pain or shortness of breath.  She does have a history of vertigo but states the symptoms do feel little different.  She is status post total hip arthroplasty on 12/24.    Dizziness Associated symptoms: headaches   Headache Associated symptoms: dizziness    Past Medical History:  Diagnosis Date   Allergy    Anemia    15+ years ago   Arthritis    Asthma    Blood transfusion without reported diagnosis    Depression    Dysrhythmia    Palpitations - Holter monitor in 2024   Family history of adverse reaction to anesthesia    sister had episode with anesthesia once, but no additional information given   Frequent headaches    Resolved as of 2024   History of phlebitis    Hx of adenomatous polyp of colon 08/15/2015   Hypertension    Hypothyroidism    Palpitations    heart monitor all normal per pt.2017   PE (pulmonary embolism)    Past Surgical History:  Procedure Laterality Date   ABDOMINAL HYSTERECTOMY  03/01/2004   CHOLECYSTECTOMY     COLONOSCOPY W/ POLYPECTOMY  07/2020   TOTAL HIP ARTHROPLASTY Right 02/21/2023   Procedure: RIGHT TOTAL HIP ARTHROPLASTY ANTERIOR APPROACH;  Surgeon: Wes Hamman, MD;  Location: [facility] OR;  Service: Orthopedics;  Laterality: Right;  3-C       Home Medications Prior to Admission medications   Medication Sig Start Date End Date Taking? Authorizing Provider   Albuterol -Budesonide  (AIRSUPRA ) 90-80 MCG/ACT AERO Inhale 2 puffs into the lungs 3 (three) times daily as needed (shortness of breath).    [provider]  amoxicillin  (AMOXIL ) 500 MG capsule Take 4 pills one hour prior to dental work 05/19/23   Sandie Cross, PA-C  docusate sodium  (COLACE) 100 MG capsule Take 1 capsule (100 mg total) by mouth daily as needed. 02/14/23 02/14/24  Sandie Cross, PA-C  fluticasone  (FLONASE ) 50 MCG/ACT nasal spray Place 2 sprays into both nostrils daily. 04/05/22   Arcadio Knuckles, MD  indapamide  (LOZOL ) 2.5 MG tablet TAKE 1 TABLET BY MOUTH EVERY DAY 12/16/22   Arcadio Knuckles, MD  levothyroxine  (SYNTHROID ) 50 MCG tablet TAKE 1 TABLET BY MOUTH EVERY DAY BEFORE BREAKFAST 03/03/23   Arcadio Knuckles, MD  methocarbamol  (ROBAXIN -750) 750 MG tablet Take 1 tablet (750 mg total) by mouth 2 (two) times daily as needed for muscle spasms. 02/14/23   Sandie Cross, PA-C  nadolol  (CORGARD ) 20 MG tablet TAKE 1 TABLET BY MOUTH EVERY DAY 04/02/23   Arcadio Knuckles, MD  oxyCODONE  (ROXICODONE ) 5 MG immediate release tablet Take 1 tablet (5 mg total) by mouth every 8 (eight) hours as needed for severe pain (pain score 7-10). 06/02/23   Arcadio Knuckles, MD  potassium chloride  (KLOR-CON ) 8 MEQ tablet TAKE 1 TABLET (  8 MEQ TOTAL) BY MOUTH 3 (THREE) TIMES DAILY. 05/03/23   Arcadio Knuckles, MD  predniSONE  (STERAPRED UNI-PAK 21 TAB) 10 MG (21) TBPK tablet Take as directed 06/24/23   Wes Hamman, MD  QUEtiapine  (SEROQUEL ) 50 MG tablet Take 1 tablet (50 mg total) by mouth at bedtime. 04/06/23   Arcadio Knuckles, MD  traZODone  (DESYREL ) 100 MG tablet Take 2 tablets (200 mg total) by mouth at bedtime. 04/06/23   Arcadio Knuckles, MD      Allergies    Latex    Review of Systems   Review of Systems  Neurological:  Positive for dizziness and headaches.    Physical Exam Updated Vital Signs BP 128/67   Pulse (!) 49   Temp 98.5 F (36.9 C)   Resp 15   Ht 5\' 6"  (1.676 m)   Wt 104.3 kg    LMP 03/01/2004   SpO2 100%   BMI 37.12 kg/m  Physical Exam Vitals and nursing note reviewed.  Constitutional:      General: She is not in acute distress.    Appearance: She is well-developed.  HENT:     Head: Normocephalic and atraumatic.  Eyes:     Conjunctiva/sclera: Conjunctivae normal.  Cardiovascular:     Rate and Rhythm: Regular rhythm. Bradycardia present.     Heart sounds: No murmur heard. Pulmonary:     Effort: Pulmonary effort is normal. No respiratory distress.     Breath sounds: Normal breath sounds.  Abdominal:     Palpations: Abdomen is soft.     Tenderness: There is no abdominal tenderness.  Musculoskeletal:        General: No swelling.     Cervical back: Neck supple.  Skin:    General: Skin is warm and dry.     Capillary Refill: Capillary refill takes less than 2 seconds.  Neurological:     Mental Status: She is alert.     Comments: Patient is alert and oriented. There is no abnormal phonation. Symmetric smile without facial droop. Moves all extremities spontaneously. 5/5 strength in upper and lower extremities. . No sensation deficit. There is no nystagmus. EOMI, PERRL. Coordination intact with finger to nose and normal ambulation.    Psychiatric:        Mood and Affect: Mood normal.     ED Results / Procedures / Treatments   Labs (all labs ordered are listed, but only abnormal results are displayed) Labs Reviewed  BASIC METABOLIC PANEL WITH GFR - Abnormal; Notable for the following components:      Result Value   Potassium 3.2 (*)    Glucose, Bld 110 (*)    All other components within normal limits  URINALYSIS, ROUTINE W REFLEX MICROSCOPIC - Abnormal; Notable for the following components:   Hgb urine dipstick SMALL (*)    Bacteria, UA RARE (*)    All other components within normal limits  D-DIMER, QUANTITATIVE - Abnormal; Notable for the following components:   D-Dimer, Quant 1.05 (*)    All other components within normal limits  CBG MONITORING,  ED - Abnormal; Notable for the following components:   Glucose-Capillary 119 (*)    All other components within normal limits  CBC    EKG EKG Interpretation Date/Time:  Tuesday June 28 2023 09:08:39 EDT Ventricular Rate:  49 PR Interval:  184 QRS Duration:  100 QT Interval:  474 QTC Calculation: 428 R Axis:   58  Text Interpretation: Sinus bradycardia Nonspecific T wave abnormality  Abnormal ECG Confirmed by Dorenda Gandy [phone]) on 06/28/2023 11:53:50 AM  Radiology CT Angio Chest PE W and/or Wo Contrast Result Date: 06/28/2023 CLINICAL DATA:  Pulmonary embolism (PE) suspected, low to intermediate prob, positive D-dimer EXAM: CT ANGIOGRAPHY CHEST WITH CONTRAST TECHNIQUE: Multidetector CT imaging of the chest was performed using the standard protocol during bolus administration of intravenous contrast. Multiplanar CT image reconstructions and MIPs were obtained to evaluate the vascular anatomy. RADIATION DOSE REDUCTION: This exam was performed according to the departmental dose-optimization program which includes automated exposure control, adjustment of the mA and/or kV according to patient size and/or use of iterative reconstruction technique. CONTRAST:  75mL OMNIPAQUE  IOHEXOL  350 MG/ML SOLN COMPARISON:  April 27, 2023 FINDINGS: Pulmonary Embolism: No pulmonary embolism. Cardiovascular: No cardiomegaly or pericardial effusion. No aortic aneurysm. Scattered aortic atherosclerosis. Mediastinum/Nodes: No mediastinal mass. No mediastinal, hilar, or axillary lymphadenopathy. Lungs/Pleura: The midline trachea and bronchi are patent. Mild centrilobular emphysema. No focal airspace consolidation, pleural effusion, or pneumothorax. Minimal subpleural scarring or atelectasis in the right lower lobe. Musculoskeletal: No acute fracture or destructive bone lesion. Multilevel degenerative disc disease of the spine. Upper Abdomen: No acute abnormality in the partially visualized upper abdomen. Review of  the MIP images confirms the above findings. IMPRESSION: 1. No acute intrathoracic abnormality; specifically, no pulmonary embolism, pneumonia, or pleural effusion. 2. Mild centrilobular emphysema. Aortic Atherosclerosis (ICD10-I70.0) and Emphysema (ICD10-J43.9). Electronically Signed   By: Rance Burrows M.D.   On: 06/28/2023 14:00    Procedures Procedures    Medications Ordered in ED Medications  meclizine  (ANTIVERT ) tablet 25 mg (has no administration in time range)  ketorolac  (TORADOL ) 15 MG/ML injection 15 mg (15 mg Intravenous Given 06/28/23 1003)  metoCLOPramide  (REGLAN ) injection 10 mg (10 mg Intravenous Given 06/28/23 1004)  diphenhydrAMINE  (BENADRYL ) injection 12.5 mg (12.5 mg Intravenous Given 06/28/23 1003)  potassium chloride  SA (KLOR-CON  M) CR tablet 40 mEq (40 mEq Oral Given 06/28/23 1227)  iohexol  (OMNIPAQUE ) 350 MG/ML injection 75 mL (75 mLs Intravenous Contrast Given 06/28/23 1301)    ED Course/ Medical Decision Making/ A&P                                 Medical Decision Making Amount and/or Complexity of Data Reviewed Labs: ordered.  Risk Prescription drug management.   This patient presents to the ED with chief complaint(s) of dizziness and headache.  The complaint involves an extensive differential diagnosis and also carries with it a high risk of complications and morbidity.   Pertinent past medical history as listed in HPI  The differential diagnosis includes  Central versus peripheral vertigo, cardiac etiology, electrolyte abnormality, CVA/TIA, migraine/tension like headache, hypoglycemia Additional history obtained: Records reviewed Care Everywhere/External Records  Assessment and management:   Hemodynamically stable patient presenting with complaints of headache with associated dizziness for the past few days.  Dizziness appears to be worse with movement.  It is not associate with chest pain or shortness of breath.  She does have a history of vertigo.   Additionally appears that she has a history of PE and is additionally 4 months status post total hip arthroplasty.  Does not appear that she is on any anticoagulation.  Although she is not tachypneic or tachycardic, overall low suspicion for PE will still obtain D-dimer.  Noted to be bradycardic during her visit with heart rate between 47-50.  Patient reports that this is typical for her.  She is not  on any new blood pressure medications.  She is not hypoglycemic.  She has no neurodeficits on exam.  Will give migraine cocktail for headache and obtain orthostatic vital signs.  Overall most suspicious for peripheral vertigo with migraine/tension like headache.  Noted to have a elevated dimer to 1.05.  Reports history of PE approximately 10 years ago.  Attributed to taking birth control at that time.  She is no longer on any anticoagulation.  Denies any chest pain but does feel like she has some palpitations the other day.  States that sometimes she does feel little short of breath but she admits to regular cigarette use.  Given elevated dimer with recent total hip arthroplasty surgery will obtain CT angio PE to rule out PE.  Lower extremities are without any swelling or tenderness, negative Homans.  Additionally noted to have hypokalemia of 3.2.  Will replete orally.  Independent ECG interpretation:  Sinus rhythm with nonspecific T wave abnormality  Independent labs interpretation:  The following labs were independently interpreted:  CBC unremarkable, BMP with mild hypokalemia, D-dimer elevated to 1.05, CBG glucose of 119  Independent visualization and interpretation of imaging: I independently visualized the following imaging with scope of interpretation limited to determining acute life threatening conditions related to emergency care: CT angio PE no acute abnormality  Consultations obtained:   none  Disposition:   Patient will be discharged home. The patient has been appropriately medically  screened and/or stabilized in the ED. I have low suspicion for any other emergent medical condition which would require further screening, evaluation or treatment in the ED or require inpatient management. At time of discharge the patient is hemodynamically stable and in no acute distress. I have discussed work-up results and diagnosis with patient and answered all questions. Patient is agreeable with discharge plan. We discussed strict return precautions for returning to the emergency department and they verbalized understanding.     Social Determinants of Health:   none  This note was dictated with voice recognition software.  Despite best efforts at proofreading, errors may have occurred which can change the documentation meaning.          Final Clinical Impression(s) / ED Diagnoses Final diagnoses:  Dizziness  Elevated d-dimer    Rx / DC Orders ED Discharge Orders     None         Stanton Earthly 06/28/23 1444    Dorenda Gandy, MD 06/30/23 [phone]

## 2023-06-30 ENCOUNTER — Other Ambulatory Visit: Payer: Self-pay | Admitting: Internal Medicine

## 2023-06-30 DIAGNOSIS — R9431 Abnormal electrocardiogram [ECG] [EKG]: Secondary | ICD-10-CM

## 2023-06-30 DIAGNOSIS — I10 Essential (primary) hypertension: Secondary | ICD-10-CM

## 2023-06-30 MED ORDER — OXYCODONE HCL 5 MG PO TABS: 5.0000 mg | ORAL_TABLET | Freq: Three times a day (TID) | ORAL | 0 refills | Status: DC | PRN

## 2023-06-30 MED ORDER — QUETIAPINE FUMARATE 50 MG PO TABS: 50.0000 mg | ORAL_TABLET | Freq: Every day | ORAL | 0 refills | Status: DC

## 2023-07-02 ENCOUNTER — Other Ambulatory Visit: Payer: Self-pay | Admitting: Internal Medicine

## 2023-07-02 DIAGNOSIS — G894 Chronic pain syndrome: Secondary | ICD-10-CM

## 2023-07-02 DIAGNOSIS — F5104 Psychophysiologic insomnia: Secondary | ICD-10-CM

## 2023-07-26 ENCOUNTER — Telehealth: Payer: Self-pay | Admitting: Orthopaedic Surgery

## 2023-07-26 NOTE — Telephone Encounter (Signed)
 Patient called. She would like a letter stating she is out of work and the reason for DSS. She would like to come and pick the letter up. Her cb# [phone]

## 2023-07-26 NOTE — Telephone Encounter (Signed)
 I can state that she was out of work for 3 months from the hip surgery starting from date of surgery.

## 2023-07-27 ENCOUNTER — Encounter: Payer: Self-pay | Admitting: Emergency Medicine

## 2023-07-27 ENCOUNTER — Ambulatory Visit (INDEPENDENT_AMBULATORY_CARE_PROVIDER_SITE_OTHER): Admitting: Emergency Medicine

## 2023-07-27 VITALS — BP 154/94 | HR 64 | Temp 98.6°F | Ht 66.0 in | Wt 227.0 lb

## 2023-07-27 DIAGNOSIS — M160 Bilateral primary osteoarthritis of hip: Secondary | ICD-10-CM

## 2023-07-27 DIAGNOSIS — M170 Bilateral primary osteoarthritis of knee: Secondary | ICD-10-CM | POA: Diagnosis not present

## 2023-07-27 DIAGNOSIS — M25551 Pain in right hip: Secondary | ICD-10-CM | POA: Diagnosis not present

## 2023-07-27 DIAGNOSIS — G894 Chronic pain syndrome: Secondary | ICD-10-CM | POA: Diagnosis not present

## 2023-07-27 DIAGNOSIS — M25552 Pain in left hip: Secondary | ICD-10-CM

## 2023-07-27 DIAGNOSIS — G8929 Other chronic pain: Secondary | ICD-10-CM

## 2023-07-27 MED ORDER — MELOXICAM 15 MG PO TABS: 15.0000 mg | ORAL_TABLET | Freq: Every day | ORAL | 0 refills | Status: AC

## 2023-07-27 MED ORDER — OXYCODONE HCL 5 MG PO TABS: 5.0000 mg | ORAL_TABLET | Freq: Three times a day (TID) | ORAL | 0 refills | Status: DC | PRN

## 2023-07-27 NOTE — Assessment & Plan Note (Signed)
 Chronic pain affecting quality of life Needs follow-up with orthopedist Pain management discussed Has been on oxycodone  before.  Recommend oxycodone  5 mg every 8 hours as needed only.  Advised about chronic opiate use and potential dangers. Recommend meloxicam  15 mg daily as needed Need to follow-up with PCP

## 2023-07-27 NOTE — Patient Instructions (Signed)
 Hip Pain The hip is the joint between the upper legs and the lower pelvis. The bones, cartilage, tendons, and muscles of your hip joint support your body and allow you to move around. Hip pain can range from a minor ache to severe pain in one or both of your hips. The pain may be felt on the inside of the hip joint near the groin, or on the outside near the buttocks and upper thigh. You may also have swelling or stiffness in your hip area. Follow these instructions at home: Managing pain, stiffness, and swelling     If told, put ice on the painful area. Put ice in a plastic bag. Place a towel between your skin and the bag. Leave the ice on for 20 minutes, 2-3 times a day. If told, apply heat to the affected area as often as told by your health care provider. Use the heat source that your provider recommends, such as a moist heat pack or a heating pad. Place a towel between your skin and the heat source. Leave the heat on for 20-30 minutes. If your skin turns bright red, remove the ice or heat right away to prevent skin damage. The risk of damage is higher if you cannot feel pain, heat, or cold. Activity Do exercises as told by your provider. Avoid activities that cause pain. General instructions  Take over-the-counter and prescription medicines only as told by your provider. Keep a journal of your symptoms. Write down: How often you have hip pain. The location of your pain. What the pain feels like. What makes the pain worse. Sleep with a pillow between your legs on your most comfortable side. Keep all follow-up visits. Your provider will monitor your pain and activity. Contact a health care provider if: You cannot put weight on your leg. Your pain or swelling gets worse after a week. It gets harder to walk. You have a fever. Get help right away if: You fall. You have a sudden increase in pain and swelling in your hip. Your hip is red or swollen or very tender to touch. This  information is not intended to replace advice given to you by your health care provider. Make sure you discuss any questions you have with your health care provider. Document Revised: 10/20/2021 Document Reviewed: 10/20/2021 Elsevier Patient Education  2024 [employer].

## 2023-07-27 NOTE — Assessment & Plan Note (Signed)
 Managed by PCP

## 2023-07-27 NOTE — Progress Notes (Signed)
 Denise Morgan 58 y.o.   Chief Complaint  Patient presents with   Hip Pain    Patient states both hips hurt but the left is more painful. In Dec. She had a hip replacement for the Right hip. Dr. Rochelle Chu gave her oxycodone  to help with that pain. The pain is moving down past her knees. She is in constant pain walking and standing. She is having vertigo and would like something for that     HISTORY OF PRESENT ILLNESS: Acute problem visit today.  Patient of Dr. Sandra Crouch. This is a 58 y.o. female complaining of bilateral hip pain left more than right for several weeks History of right hip replacement last December History of chronic pain.  Hip Pain      Prior to Admission medications   Medication Sig Start Date End Date Taking? Authorizing Provider  Albuterol -Budesonide  (AIRSUPRA ) 90-80 MCG/ACT AERO Inhale 2 puffs into the lungs 3 (three) times daily as needed (shortness of breath).   Yes [provider]  docusate sodium  (COLACE) 100 MG capsule Take 1 capsule (100 mg total) by mouth daily as needed. 02/14/23 02/14/24 Yes Sandie Cross, PA-C  fluticasone  (FLONASE ) 50 MCG/ACT nasal spray Place 2 sprays into both nostrils daily. 04/05/22  Yes Arcadio Knuckles, MD  indapamide  (LOZOL ) 2.5 MG tablet TAKE 1 TABLET BY MOUTH EVERY DAY 12/16/22  Yes Arcadio Knuckles, MD  levothyroxine  (SYNTHROID ) 50 MCG tablet TAKE 1 TABLET BY MOUTH EVERY DAY BEFORE BREAKFAST 03/03/23  Yes Arcadio Knuckles, MD  meclizine  (ANTIVERT ) 25 MG tablet Take 1 tablet (25 mg total) by mouth 3 (three) times daily as needed for dizziness. 06/28/23  Yes Felicie Horning, PA-C  meloxicam  (MOBIC ) 15 MG tablet Take 1 tablet (15 mg total) by mouth daily for 10 days. 07/27/23 08/06/23 Yes Jennifermarie Franzen, Isidro Margo, MD  methocarbamol  (ROBAXIN -750) 750 MG tablet Take 1 tablet (750 mg total) by mouth 2 (two) times daily as needed for muscle spasms. 02/14/23  Yes Sandie Cross, PA-C  nadolol  (CORGARD ) 20 MG tablet TAKE 1 TABLET BY  MOUTH EVERY DAY 07/12/23  Yes Arcadio Knuckles, MD  potassium chloride  (KLOR-CON ) 8 MEQ tablet TAKE 1 TABLET (8 MEQ TOTAL) BY MOUTH 3 (THREE) TIMES DAILY. 05/03/23  Yes Arcadio Knuckles, MD  QUEtiapine  (SEROQUEL ) 50 MG tablet Take 1 tablet (50 mg total) by mouth at bedtime. 06/30/23  Yes Arcadio Knuckles, MD  traZODone  (DESYREL ) 100 MG tablet TAKE 2 TABLETS BY MOUTH AT BEDTIME. 07/12/23  Yes Arcadio Knuckles, MD  amoxicillin  (AMOXIL ) 500 MG capsule Take 4 pills one hour prior to dental work Patient not taking: Reported on 07/27/2023 05/19/23   Sandie Cross, PA-C  oxyCODONE  (ROXICODONE ) 5 MG immediate release tablet Take 1 tablet (5 mg total) by mouth every 8 (eight) hours as needed for severe pain (pain score 7-10). 07/27/23   Elvira Hammersmith, MD  predniSONE  (STERAPRED UNI-PAK 21 TAB) 10 MG (21) TBPK tablet Take as directed Patient not taking: Reported on 07/27/2023 06/24/23   Wes Hamman, MD    Allergies  Allergen Reactions   Latex Itching and Rash    Patient Active Problem List   Diagnosis Date Noted   Need for vaccination 04/12/2023   Bradycardia 04/12/2023   Hyperplastic colonic polyp 04/06/2023   Psychophysiological insomnia 04/06/2023   Encounter for general adult medical examination with abnormal findings 01/04/2023   Prolonged Q-T interval on ECG 01/04/2023   Seasonal allergic rhinitis due to  fungal spores 04/05/2022   Moderate persistent asthma with exacerbation 04/05/2022   Encounter for long-term use of opiate analgesic 02/16/2020   Acquired hypothyroidism 02/12/2020   Essential hypertension 04/07/2016   Snoring 04/07/2016   Hx of adenomatous polyp of colon 08/15/2015   Asthma, mild intermittent 05/29/2015   Hyperlipidemia with target LDL less than 130 05/29/2015   Subacromial bursitis 04/18/2014   Obesity, morbid (HCC) 02/16/2013   Primary osteoarthritis of right hip 02/16/2013   Tobacco abuse disorder 11/19/2012   Visit for screening mammogram 11/17/2012   DJD  (degenerative joint disease) of knee 11/17/2012   Chronic pain syndrome 11/17/2012    Past Medical History:  Diagnosis Date   Allergy    Anemia    15+ years ago   Arthritis    Asthma    Blood transfusion without reported diagnosis    Depression    Dysrhythmia    Palpitations - Holter monitor in 2024   Family history of adverse reaction to anesthesia    sister had episode with anesthesia once, but no additional information given   Frequent headaches    Resolved as of 2024   History of phlebitis    Hx of adenomatous polyp of colon 08/15/2015   Hypertension    Hypothyroidism    Palpitations    heart monitor all normal per pt.2017   PE (pulmonary embolism)     Past Surgical History:  Procedure Laterality Date   ABDOMINAL HYSTERECTOMY  03/01/2004   CHOLECYSTECTOMY     COLONOSCOPY W/ POLYPECTOMY  07/2020   TOTAL HIP ARTHROPLASTY Right 02/21/2023   Procedure: RIGHT TOTAL HIP ARTHROPLASTY ANTERIOR APPROACH;  Surgeon: Wes Hamman, MD;  Location: [facility] OR;  Service: Orthopedics;  Laterality: Right;  3-C    Social History   Socioeconomic History   Marital status: Single    Spouse name: Not on file   Number of children: Not on file   Years of education: Not on file   Highest education level: Not on file  Occupational History   Not on file  Tobacco Use   Smoking status: Every Day    Current packs/day: 0.75    Average packs/day: 0.8 packs/day for 33.0 years (24.8 ttl pk-yrs)    Types: Cigarettes   Smokeless tobacco: Never  Vaping Use   Vaping status: Never Used  Substance and Sexual Activity   Alcohol use: Not Currently    Comment: 2-3x/week - wine   Drug use: No   Sexual activity: Not Currently    Birth control/protection: Surgical  Other Topics Concern   Not on file  Social History Narrative   Not on file   Social Drivers of Health   Financial Resource Strain: Not on file  Food Insecurity: Not on file  Transportation Needs: Not on file  Physical Activity: Not  on file  Stress: Not on file  Social Connections: Not on file  Intimate Partner Violence: Not on file    Family History  Problem Relation Age of Onset   Colon polyps Mother    Ovarian cancer Mother    Arthritis Mother    Colon cancer Maternal Aunt    Esophageal cancer Neg Hx    Rectal cancer Neg Hx    Stomach cancer Neg Hx      Review of Systems  Constitutional: Negative.  Negative for chills and fever.  HENT: Negative.  Negative for congestion and sore throat.   Respiratory: Negative.  Negative for cough and shortness of breath.  Cardiovascular: Negative.  Negative for chest pain and palpitations.  Gastrointestinal:  Negative for abdominal pain, nausea and vomiting.  Musculoskeletal:  Positive for joint pain.  Skin: Negative.  Negative for rash.  Neurological: Negative.  Negative for dizziness and headaches.  All other systems reviewed and are negative.   Vitals:   07/27/23 1459  BP: (!) 154/94  Pulse: 64  Temp: 98.6 F (37 C)  SpO2: 97%    Physical Exam Vitals reviewed.  Constitutional:      Appearance: Normal appearance. She is obese.  HENT:     Head: Normocephalic.  Eyes:     Extraocular Movements: Extraocular movements intact.  Cardiovascular:     Rate and Rhythm: Normal rate.  Pulmonary:     Effort: Pulmonary effort is normal.  Skin:    General: Skin is warm and dry.     Capillary Refill: Capillary refill takes less than 2 seconds.  Neurological:     General: No focal deficit present.     Mental Status: She is alert and oriented to person, place, and time.  Psychiatric:        Mood and Affect: Mood normal.        Behavior: Behavior normal.      ASSESSMENT & PLAN: A total of 32 minutes was spent with the patient and counseling/coordination of care regarding preparing for this visit, review of most recent office visit notes, review of multiple chronic medical conditions under management, review of all medications, pain management, prognosis,  documentation, and need for follow-up with both orthopedist and PCP.  Problem List Items Addressed This Visit       Musculoskeletal and Integument   DJD (degenerative joint disease) of knee   Relevant Medications   oxyCODONE  (ROXICODONE ) 5 MG immediate release tablet   meloxicam  (MOBIC ) 15 MG tablet     Other   Chronic pain syndrome   Managed by PCP      Relevant Medications   oxyCODONE  (ROXICODONE ) 5 MG immediate release tablet   meloxicam  (MOBIC ) 15 MG tablet   Chronic hip pain, bilateral - Primary   Chronic pain affecting quality of life Needs follow-up with orthopedist Pain management discussed Has been on oxycodone  before.  Recommend oxycodone  5 mg every 8 hours as needed only.  Advised about chronic opiate use and potential dangers. Recommend meloxicam  15 mg daily as needed Need to follow-up with PCP      Relevant Medications   oxyCODONE  (ROXICODONE ) 5 MG immediate release tablet   meloxicam  (MOBIC ) 15 MG tablet   Other Visit Diagnoses       Primary osteoarthritis of both hips       Relevant Medications   oxyCODONE  (ROXICODONE ) 5 MG immediate release tablet   meloxicam  (MOBIC ) 15 MG tablet      Patient Instructions  Hip Pain The hip is the joint between the upper legs and the lower pelvis. The bones, cartilage, tendons, and muscles of your hip joint support your body and allow you to move around. Hip pain can range from a minor ache to severe pain in one or both of your hips. The pain may be felt on the inside of the hip joint near the groin, or on the outside near the buttocks and upper thigh. You may also have swelling or stiffness in your hip area. Follow these instructions at home: Managing pain, stiffness, and swelling     If told, put ice on the painful area. Put ice in a plastic bag. Place a  towel between your skin and the bag. Leave the ice on for 20 minutes, 2-3 times a day. If told, apply heat to the affected area as often as told by your health  care provider. Use the heat source that your provider recommends, such as a moist heat pack or a heating pad. Place a towel between your skin and the heat source. Leave the heat on for 20-30 minutes. If your skin turns bright red, remove the ice or heat right away to prevent skin damage. The risk of damage is higher if you cannot feel pain, heat, or cold. Activity Do exercises as told by your provider. Avoid activities that cause pain. General instructions  Take over-the-counter and prescription medicines only as told by your provider. Keep a journal of your symptoms. Write down: How often you have hip pain. The location of your pain. What the pain feels like. What makes the pain worse. Sleep with a pillow between your legs on your most comfortable side. Keep all follow-up visits. Your provider will monitor your pain and activity. Contact a health care provider if: You cannot put weight on your leg. Your pain or swelling gets worse after a week. It gets harder to walk. You have a fever. Get help right away if: You fall. You have a sudden increase in pain and swelling in your hip. Your hip is red or swollen or very tender to touch. This information is not intended to replace advice given to you by your health care provider. Make sure you discuss any questions you have with your health care provider. Document Revised: 10/20/2021 Document Reviewed: 10/20/2021 Elsevier Patient Education  2024 Elsevier Inc.    Maryagnes Small, MD [facility]

## 2023-07-27 NOTE — Telephone Encounter (Signed)
 Sent patient a Mychart msg. Dr. Christiane Cowing advised he can do a note for " I can state that she was out of work for 3 months from the hip surgery starting from date of surgery."   Waiting for patient's response.

## 2023-07-29 NOTE — Therapy (Signed)
 OUTPATIENT PHYSICAL THERAPY LOWER EXTREMITY EVALUATION   Patient Name: Denise Morgan MRN: [id number] DOB:[birth year], 58 y.o., female Today's Date: 08/01/2023  END OF SESSION:  PT End of Session - 08/01/23 0824     Visit Number 1    Number of Visits 12    Date for PT Re-Evaluation 10/01/23    Authorization Type [facility]    PT Start Time 0745    PT Stop Time 0830    PT Time Calculation (min) 45 min    Activity Tolerance Patient tolerated treatment well    Behavior During Therapy [facility] for tasks assessed/performed             Past Medical History:  Diagnosis Date   Allergy    Anemia    15+ years ago   Arthritis    Asthma    Blood transfusion without reported diagnosis    Depression    Dysrhythmia    Palpitations - Holter monitor in 2024   Family history of adverse reaction to anesthesia    sister had episode with anesthesia once, but no additional information given   Frequent headaches    Resolved as of 2024   History of phlebitis    Hx of adenomatous polyp of colon 08/15/2015   Hypertension    Hypothyroidism    Palpitations    heart monitor all normal per pt.2017   PE (pulmonary embolism)    Past Surgical History:  Procedure Laterality Date   ABDOMINAL HYSTERECTOMY  03/01/2004   CHOLECYSTECTOMY     COLONOSCOPY W/ POLYPECTOMY  07/2020   TOTAL HIP ARTHROPLASTY Right 02/21/2023   Procedure: RIGHT TOTAL HIP ARTHROPLASTY ANTERIOR APPROACH;  Surgeon: Wes Hamman, MD;  Location: [facility] OR;  Service: Orthopedics;  Laterality: Right;  3-C   Patient Active Problem List   Diagnosis Date Noted   Need for vaccination 04/12/2023   Bradycardia 04/12/2023   Hyperplastic colonic polyp 04/06/2023   Psychophysiological insomnia 04/06/2023   Encounter for general adult medical examination with abnormal findings 01/04/2023   Prolonged Q-T interval on ECG 01/04/2023   Seasonal allergic rhinitis due to fungal spores 04/05/2022   Moderate persistent asthma with exacerbation  04/05/2022   Encounter for long-term use of opiate analgesic 02/16/2020   Acquired hypothyroidism 02/12/2020   Chronic hip pain, bilateral 05/22/2019   Essential hypertension 04/07/2016   Snoring 04/07/2016   Hx of adenomatous polyp of colon 08/15/2015   Asthma, mild intermittent 05/29/2015   Hyperlipidemia with target LDL less than 130 05/29/2015   Subacromial bursitis 04/18/2014   Obesity, morbid (HCC) 02/16/2013   Primary osteoarthritis of right hip 02/16/2013   Tobacco abuse disorder 11/19/2012   Visit for screening mammogram 11/17/2012   DJD (degenerative joint disease) of knee 11/17/2012   Chronic pain syndrome 11/17/2012    PCP: Arcadio Knuckles, MD   REFERRING PROVIDER: Wes Hamman, MD  REFERRING DIAG: [id number] (ICD-10-CM) - Bilateral hip pain Z96.641 (ICD-10-CM) - Status post total replacement of right hip M54.50,G89.29 (ICD-10-CM) - Chronic bilateral low back pain without sciatica  THERAPY DIAG:  Pain of both hip joints  Other low back pain  Muscle weakness (generalized)  Rationale for Evaluation and Treatment: Rehabilitation  ONSET DATE: chronic  SUBJECTIVE:   SUBJECTIVE STATEMENT: Patient presents with B hip and low back as well as sciatica like symptoms.  PMHx of R THA 12/24, need of L THA and mild degenerative changes in low back.  Has has OPPT following R THA but symptoms persist.  PERTINENT HISTORY: Bilateral hip pain post-replacement Four months post-hip replacement with persistent bilateral hip pain. No infection or surgical complications. X-ray normal for replaced hip; contralateral hip arthritic. Discussed opioid risks and non-opioid pain management. - Order blood work to rule out rheumatoid arthritis or other conditions. - Prescribe prednisone  to reduce inflammation. - Advise on opioid risks and addiction potential.   Chronic pain due to arthritis Chronic pain from arthritis affecting multiple joints, consistent with polyarthralgia. -  Prescribe prednisone  to reduce inflammation. - Refer to physical therapy for bilateral hip pain.   Low back pain Chronic low back pain with radiating sharp pains. Suspected contribution to chronic pain syndrome. - Order x-ray of the back. - Refer to physical therapy for low back pain. - Consider MRI if no improvement after six weeks of physical therapy.  PAIN:  Are you having pain? Yes: NPRS scale: 10/10 Pain location: low back and hips Pain description: ache, sore Aggravating factors: activity Relieving factors: supine and heat  PRECAUTIONS: None  RED FLAGS: None   WEIGHT BEARING RESTRICTIONS: No  FALLS:  Has patient fallen in last 6 months? No  OCCUPATION: HH aide  PLOF: Independent  PATIENT GOALS: To manage my hip pain  NEXT MD VISIT: 6 months  OBJECTIVE:  Note: Objective measures were completed at Evaluation unless otherwise noted.  DIAGNOSTIC FINDINGS: X-rays of the pelvis and the right hip show stable right total hip  arthroplasty without any complications.  X-rays of the lumbar spine show diffuse degenerative changes.  Grade 1  spondylolisthesis of L4-5.  PATIENT SURVEYS:  Patient-specific activity scoring scheme (Point to one number):  "0" represents "unable to perform." "10" represents "able to perform at prior level. 0 1 2 3 4 5 6 7 8 9  10 (Date and Score) Activity Initial  Activity Eval     walking  5    bending  4    sitting 4   standing 4     Total score = sum of the activity scores/number of activities Minimum detectable change (90%CI) for average score = 2 points Minimum detectable change (90%CI) for single activity score = 3 points PSFS developed by: [place] Spitz., & Binkley, J. (1995). Assessing disability and change on individual  patients: a report of a patient specific measure. Physiotherapy [place], 47, [phone]. Reproduced with the permission of the authors  Score: 17/40 42% perceived function  MUSCLE  LENGTH: Hamstrings: Right 80 deg; Left 80 deg Thomas test: painful B  POSTURE: flexed trunk  and flexed hips  PALPATION: deferred  LUMBAR ROM:   Active  A/PROM  eval  Flexion 75%  Extension 75%  Right lateral flexion 50%  Left lateral flexion 50%  Right rotation   Left rotation    (Blank rows = not tested)   LOWER EXTREMITY ROM:  Passive ROM Right eval Left eval  Hip flexion 90d 90d  Hip extension    Hip abduction    Hip adduction    Hip internal rotation    Hip external rotation    Knee flexion    Knee extension    Ankle dorsiflexion    Ankle plantarflexion    Ankle inversion    Ankle eversion     (Blank rows = not tested)  LOWER EXTREMITY MMT:  MMT Right eval Left eval  Hip flexion    Hip extension    Hip abduction    Hip adduction    Hip internal rotation    Hip external rotation  Knee flexion    Knee extension    Ankle dorsiflexion    Ankle plantarflexion    Ankle inversion    Ankle eversion     (Blank rows = not tested)  LOWER EXTREMITY SPECIAL TESTS:  Hip special tests: Andy Bannister test: positive , Anterior hip impingement test: negative, and pain with L hip ROM  FUNCTIONAL TESTS:  30 seconds chair stand test  GAIT: Distance walked: 52ft x2 Assistive device utilized: None Level of assistance: Complete Independence Comments: antalgic gait pattern with decreased B hip ROM observed                                                                                                                                TREATMENT DATE: [facility] Adult PT Treatment:                                                DATE: 08/01/23 Eval and HEP Self Care: Additional minutes spent for educating on updated Therapeutic Home Exercise Program as well as comparing current status to condition at start of symptoms. This included exercises focusing on stretching, strengthening, with focus on eccentric aspects. Long term goals include an improvement in range of motion,  strength, endurance as well as avoiding reinjury. Patient's frequency would include in 1-2 times a day, 3-5 times a week for a duration of 6-12 weeks. Proper technique shown and discussed handout in great detail. All questions were discussed and addressed.      PATIENT EDUCATION:  Education details: Discussed eval findings, rehab rationale and POC and patient is in agreement  Person educated: Patient Education method: Explanation and Handouts Education comprehension: verbalized understanding and needs further education  HOME EXERCISE PROGRAM: Access Code: [email or web address] URL: [email or web address] Date: 08/01/2023 Prepared by: Gretta Leavens  Exercises - Sit to Stand with Arms Crossed  - 2 x daily - 5 x weekly - 1 sets - Modified Thomas Stretch  - 2 x daily - 5 x weekly - 1 sets - 2 reps - 30s hold - Heel Toe Raises with Counter Support  - 2 x daily - 5 x weekly - 2 sets - 10 reps  ASSESSMENT:  CLINICAL IMPRESSION: Patient is a 58 y.o. female who was seen today for physical therapy evaluation and treatment for B hip and low back pain.  Scope of assessment limited by pain and inability to attain testing positions as well as multiple involve body parts.  L hip in need of THA and appears to be driving remainder of symptoms as patient has adopted a flexed hip posture.  B hip flexor tightness observed.  L hip very painful with testing positions/tasks.  Trunk ROM restricted as noted.  Patient reports high pain levels and rates her function at 42%.  OBJECTIVE IMPAIRMENTS: Abnormal gait, decreased activity tolerance, decreased knowledge  of condition, decreased mobility, difficulty walking, decreased ROM, decreased strength, improper body mechanics, obesity, and pain.   ACTIVITY LIMITATIONS: carrying, lifting, bending, sitting, squatting, sleeping, and bed mobility  REHAB POTENTIAL: Good  CLINICAL DECISION MAKING: Stable/uncomplicated  EVALUATION COMPLEXITY: Low   GOALS: Goals  reviewed with patient? No  SHORT TERM GOALS: Target date: 08/22/2023  Patient to demonstrate independence in HEP  Baseline: Goal status: INITIAL  2.  Assess 2 MWT and establish baseline Baseline: TBD Goal status: INITIAL    LONG TERM GOALS: Target date: 09/12/2023    Patient will increase 30s chair stand reps from 2 to 6 with/without arms to demonstrate and improved functional ability with less pain/difficulty as well as reduce fall risk.  Baseline:  Goal status: INITIAL  2.  Patient will acknowledge 6/10 pain at least once during episode of care   Baseline: 10/10 Goal status: INITIAL  3.  Patient will score at least 50% on PSFS to signify clinically meaningful improvement in functional abilities.   Baseline: 42% Goal status: INITIAL  4.  Re-assess 2 MWT Baseline:  Goal status: INITIAL  5.  100d B hip flexion Baseline: 90d Goal status: INITIAL   PLAN:  PT FREQUENCY: 1-2x/week  PT DURATION: 6 weeks  PLANNED INTERVENTIONS: 97110-Therapeutic exercises, 97530- Therapeutic activity, 97112- Neuromuscular re-education, 97535- Self Care, [id number]- Manual therapy, Patient/Family education, Balance training, and Stair training  PLAN FOR NEXT SESSION: HEP review and update, manual techniques as appropriate, aerobic tasks, ROM and flexibility activities, strengthening and PREs, TPDN, gait and balance training as needed    For all possible CPT codes, reference the Planned Interventions line above.     Check all conditions that are expected to impact treatment: {Conditions expected to impact treatment:Complications related to surgery   If treatment provided at initial evaluation, no treatment charged due to lack of authorization.        Jarissa Sheriff M Brian Kocourek, PT 08/01/2023, 11:43 AM

## 2023-08-01 ENCOUNTER — Other Ambulatory Visit: Payer: Self-pay

## 2023-08-01 ENCOUNTER — Ambulatory Visit: Attending: Orthopaedic Surgery

## 2023-08-01 DIAGNOSIS — M25551 Pain in right hip: Secondary | ICD-10-CM | POA: Insufficient documentation

## 2023-08-01 DIAGNOSIS — M545 Low back pain, unspecified: Secondary | ICD-10-CM | POA: Diagnosis not present

## 2023-08-01 DIAGNOSIS — Z96641 Presence of right artificial hip joint: Secondary | ICD-10-CM | POA: Diagnosis not present

## 2023-08-01 DIAGNOSIS — G8929 Other chronic pain: Secondary | ICD-10-CM | POA: Diagnosis not present

## 2023-08-01 DIAGNOSIS — M6281 Muscle weakness (generalized): Secondary | ICD-10-CM | POA: Diagnosis present

## 2023-08-01 DIAGNOSIS — M5459 Other low back pain: Secondary | ICD-10-CM | POA: Diagnosis present

## 2023-08-01 DIAGNOSIS — M25552 Pain in left hip: Secondary | ICD-10-CM | POA: Diagnosis present

## 2023-08-04 NOTE — Therapy (Signed)
 OUTPATIENT PHYSICAL THERAPY LOWER EXTREMITY EVALUATION   Patient Name: Denise Morgan MRN: [id number] DOB:1965/12/11, 58 y.o., female Today's Date: 08/08/2023  END OF SESSION:  PT End of Session - 08/08/23 0829     Visit Number 2    Number of Visits 12    Date for PT Re-Evaluation 10/01/23    Authorization Type [employer]    PT Start Time 0830    PT Stop Time 0910    PT Time Calculation (min) 40 min    Activity Tolerance Patient tolerated treatment well    Behavior During Therapy [facility] for tasks assessed/performed              Past Medical History:  Diagnosis Date   Allergy    Anemia    15+ years ago   Arthritis    Asthma    Blood transfusion without reported diagnosis    Depression    Dysrhythmia    Palpitations - Holter monitor in 2024   Family history of adverse reaction to anesthesia    sister had episode with anesthesia once, but no additional information given   Frequent headaches    Resolved as of 2024   History of phlebitis    Hx of adenomatous polyp of colon 08/15/2015   Hypertension    Hypothyroidism    Palpitations    heart monitor all normal per pt.2017   PE (pulmonary embolism)    Past Surgical History:  Procedure Laterality Date   ABDOMINAL HYSTERECTOMY  03/01/2004   CHOLECYSTECTOMY     COLONOSCOPY W/ POLYPECTOMY  07/2020   TOTAL HIP ARTHROPLASTY Right 02/21/2023   Procedure: RIGHT TOTAL HIP ARTHROPLASTY ANTERIOR APPROACH;  Surgeon: Wes Hamman, MD;  Location: [facility] OR;  Service: Orthopedics;  Laterality: Right;  3-C   Patient Active Problem List   Diagnosis Date Noted   Need for vaccination 04/12/2023   Bradycardia 04/12/2023   Hyperplastic colonic polyp 04/06/2023   Psychophysiological insomnia 04/06/2023   Encounter for general adult medical examination with abnormal findings 01/04/2023   Prolonged Q-T interval on ECG 01/04/2023   Seasonal allergic rhinitis due to fungal spores 04/05/2022   Moderate persistent asthma with  exacerbation 04/05/2022   Encounter for long-term use of opiate analgesic 02/16/2020   Acquired hypothyroidism 02/12/2020   Chronic hip pain, bilateral 05/22/2019   Essential hypertension 04/07/2016   Snoring 04/07/2016   Hx of adenomatous polyp of colon 08/15/2015   Asthma, mild intermittent 05/29/2015   Hyperlipidemia with target LDL less than 130 05/29/2015   Subacromial bursitis 04/18/2014   Obesity, morbid (HCC) 02/16/2013   Primary osteoarthritis of right hip 02/16/2013   Tobacco abuse disorder 11/19/2012   Visit for screening mammogram 11/17/2012   DJD (degenerative joint disease) of knee 11/17/2012   Chronic pain syndrome 11/17/2012    PCP: Arcadio Knuckles, MD   REFERRING PROVIDER: Wes Hamman, MD  REFERRING DIAG: [phone] (ICD-10-CM) - Bilateral hip pain Z96.641 (ICD-10-CM) - Status post total replacement of right hip M54.50,G89.29 (ICD-10-CM) - Chronic bilateral low back pain without sciatica  THERAPY DIAG:  Pain of both hip joints  Other low back pain  Muscle weakness (generalized)  Rationale for Evaluation and Treatment: Rehabilitation  ONSET DATE: chronic  SUBJECTIVE:   SUBJECTIVE STATEMENT: Reports continued 7/10 level of symptoms, has been compliant with HEP  PERTINENT HISTORY: Bilateral hip pain post-replacement Four months post-hip replacement with persistent bilateral hip pain. No infection or surgical complications. X-ray normal for replaced hip; contralateral hip arthritic. Discussed  opioid risks and non-opioid pain management. - Order blood work to rule out rheumatoid arthritis or other conditions. - Prescribe prednisone  to reduce inflammation. - Advise on opioid risks and addiction potential.   Chronic pain due to arthritis Chronic pain from arthritis affecting multiple joints, consistent with polyarthralgia. - Prescribe prednisone  to reduce inflammation. - Refer to physical therapy for bilateral hip pain.   Low back pain Chronic low  back pain with radiating sharp pains. Suspected contribution to chronic pain syndrome. - Order x-ray of the back. - Refer to physical therapy for low back pain. - Consider MRI if no improvement after six weeks of physical therapy.  PAIN:  Are you having pain? Yes: NPRS scale: 10/10 Pain location: low back and hips Pain description: ache, sore Aggravating factors: activity Relieving factors: supine and heat  PRECAUTIONS: None  RED FLAGS: None   WEIGHT BEARING RESTRICTIONS: No  FALLS:  Has patient fallen in last 6 months? No  OCCUPATION: HH aide  PLOF: Independent  PATIENT GOALS: To manage my hip pain  NEXT MD VISIT: 6 months  OBJECTIVE:  Note: Objective measures were completed at Evaluation unless otherwise noted.  DIAGNOSTIC FINDINGS: X-rays of the pelvis and the right hip show stable right total hip  arthroplasty without any complications.  X-rays of the lumbar spine show diffuse degenerative changes.  Grade 1  spondylolisthesis of L4-5.  PATIENT SURVEYS:  Patient-specific activity scoring scheme (Point to one number):  "0" represents "unable to perform." "10" represents "able to perform at prior level. 0 1 2 3 4 5 6 7 8 9  10 (Date and Score) Activity Initial  Activity Eval     walking  5    bending  4    sitting 4   standing 4     Total score = sum of the activity scores/number of activities Minimum detectable change (90%CI) for average score = 2 points Minimum detectable change (90%CI) for single activity score = 3 points PSFS developed by: [place] Spitz., & Binkley, J. (1995). Assessing disability and change on individual  patients: a report of a patient specific measure. Physiotherapy [place], 47, [phone]. Reproduced with the permission of the authors  Score: 17/40 42% perceived function  MUSCLE LENGTH: Hamstrings: Right 80 deg; Left 80 deg Thomas test: painful B  POSTURE: flexed trunk  and flexed  hips  PALPATION: deferred  LUMBAR ROM:   Active  A/PROM  eval  Flexion 75%  Extension 75%  Right lateral flexion 50%  Left lateral flexion 50%  Right rotation   Left rotation    (Blank rows = not tested)   LOWER EXTREMITY ROM:  Passive ROM Right eval Left eval  Hip flexion 90d 90d  Hip extension    Hip abduction    Hip adduction    Hip internal rotation    Hip external rotation    Knee flexion    Knee extension    Ankle dorsiflexion    Ankle plantarflexion    Ankle inversion    Ankle eversion     (Blank rows = not tested)  LOWER EXTREMITY MMT:  MMT Right eval Left eval  Hip flexion    Hip extension    Hip abduction    Hip adduction    Hip internal rotation    Hip external rotation    Knee flexion    Knee extension    Ankle dorsiflexion    Ankle plantarflexion    Ankle inversion    Ankle  eversion     (Blank rows = not tested)  LOWER EXTREMITY SPECIAL TESTS:  Hip special tests: Andy Bannister test: positive , Anterior hip impingement test: negative, and pain with L hip ROM  FUNCTIONAL TESTS:  30 seconds chair stand test  GAIT: Distance walked: 85ft x2 Assistive device utilized: None Level of assistance: Complete Independence Comments: antalgic gait pattern with decreased B hip ROM observed                                                                                                                                TREATMENT DATE: [facility] Adult PT Treatment:                                                DATE: 08/08/23 Therapeutic Exercise: Nustep L4 8 min Neuromuscular re-ed: Supine hip fallouts RTB 15x B, 15/15 unilaterally Bridge against RTB 15x Bridge with ball 15x P-ball curl ups 15x B, 15/15 unilateral Therapeutic Activity: S/L clams RTB 15/15 Seated hamstring stretch 30s x2 B Supine hip flexor stretch 30s x2 B STS 10x arms crossed  [facility] Adult PT Treatment:                                                DATE: 08/01/23 Eval and HEP Self  Care: Additional minutes spent for educating on updated Therapeutic Home Exercise Program as well as comparing current status to condition at start of symptoms. This included exercises focusing on stretching, strengthening, with focus on eccentric aspects. Long term goals include an improvement in range of motion, strength, endurance as well as avoiding reinjury. Patient's frequency would include in 1-2 times a day, 3-5 times a week for a duration of 6-12 weeks. Proper technique shown and discussed handout in great detail. All questions were discussed and addressed.      PATIENT EDUCATION:  Education details: Discussed eval findings, rehab rationale and POC and patient is in agreement  Person educated: Patient Education method: Explanation and Handouts Education comprehension: verbalized understanding and needs further education  HOME EXERCISE PROGRAM: Access Code: [email or web address] URL: [email or web address] Date: 08/01/2023 Prepared by: Gretta Leavens  Exercises - Sit to Stand with Arms Crossed  - 2 x daily - 5 x weekly - 1 sets - Modified Thomas Stretch  - 2 x daily - 5 x weekly - 1 sets - 2 reps - 30s hold - Heel Toe Raises with Counter Support  - 2 x daily - 5 x weekly - 2 sets - 10 reps  ASSESSMENT:  CLINICAL IMPRESSION: First f/u session with focus of treatment aerobic w/u, stretching tasks and hip/lumbosacral strengthening.  Incorporated functional strengthening of core and hip abductors and extensors.  Patient able to perform all tasks w/o aggravating symptoms  Patient is a 58 y.o. female who was seen today for physical therapy evaluation and treatment for B hip and low back pain.  Scope of assessment limited by pain and inability to attain testing positions as well as multiple involve body parts.  L hip in need of THA and appears to be driving remainder of symptoms as patient has adopted a flexed hip posture.  B hip flexor tightness observed.  L hip very painful with testing  positions/tasks.  Trunk ROM restricted as noted.  Patient reports high pain levels and rates her function at 42%.  OBJECTIVE IMPAIRMENTS: Abnormal gait, decreased activity tolerance, decreased knowledge of condition, decreased mobility, difficulty walking, decreased ROM, decreased strength, improper body mechanics, obesity, and pain.   ACTIVITY LIMITATIONS: carrying, lifting, bending, sitting, squatting, sleeping, and bed mobility  REHAB POTENTIAL: Good  CLINICAL DECISION MAKING: Stable/uncomplicated  EVALUATION COMPLEXITY: Low   GOALS: Goals reviewed with patient? No  SHORT TERM GOALS: Target date: 08/22/2023  Patient to demonstrate independence in HEP  Baseline: Goal status: INITIAL  2.  Assess 2 MWT and establish baseline Baseline: TBD Goal status: INITIAL    LONG TERM GOALS: Target date: 09/12/2023    Patient will increase 30s chair stand reps from 2 to 6 with/without arms to demonstrate and improved functional ability with less pain/difficulty as well as reduce fall risk.  Baseline:  Goal status: INITIAL  2.  Patient will acknowledge 6/10 pain at least once during episode of care   Baseline: 10/10 Goal status: INITIAL  3.  Patient will score at least 50% on PSFS to signify clinically meaningful improvement in functional abilities.   Baseline: 42% Goal status: INITIAL  4.  Re-assess 2 MWT Baseline:  Goal status: INITIAL  5.  100d B hip flexion Baseline: 90d Goal status: INITIAL   PLAN:  PT FREQUENCY: 1-2x/week  PT DURATION: 6 weeks  PLANNED INTERVENTIONS: 97110-Therapeutic exercises, 97530- Therapeutic activity, 97112- Neuromuscular re-education, 97535- Self Care, 40981- Manual therapy, Patient/Family education, Balance training, and Stair training  PLAN FOR NEXT SESSION: HEP review and update, manual techniques as appropriate, aerobic tasks, ROM and flexibility activities, strengthening and PREs, TPDN, gait and balance training as needed    For all  possible CPT codes, reference the Planned Interventions line above.     Check all conditions that are expected to impact treatment: {Conditions expected to impact treatment:Complications related to surgery   If treatment provided at initial evaluation, no treatment charged due to lack of authorization.        Shanitra Phillippi M Jaleya Pebley, PT 08/08/2023, 9:15 AM

## 2023-08-05 ENCOUNTER — Other Ambulatory Visit: Payer: Self-pay | Admitting: Internal Medicine

## 2023-08-05 DIAGNOSIS — I10 Essential (primary) hypertension: Secondary | ICD-10-CM

## 2023-08-08 ENCOUNTER — Ambulatory Visit

## 2023-08-08 DIAGNOSIS — M25551 Pain in right hip: Secondary | ICD-10-CM

## 2023-08-08 DIAGNOSIS — M5459 Other low back pain: Secondary | ICD-10-CM

## 2023-08-08 DIAGNOSIS — M6281 Muscle weakness (generalized): Secondary | ICD-10-CM

## 2023-08-15 ENCOUNTER — Ambulatory Visit

## 2023-08-15 DIAGNOSIS — M5459 Other low back pain: Secondary | ICD-10-CM

## 2023-08-15 DIAGNOSIS — M25551 Pain in right hip: Secondary | ICD-10-CM | POA: Diagnosis not present

## 2023-08-15 DIAGNOSIS — M6281 Muscle weakness (generalized): Secondary | ICD-10-CM

## 2023-08-15 NOTE — Therapy (Signed)
 OUTPATIENT PHYSICAL THERAPY LOWER EXTREMITY EVALUATION   Patient Name: Denise Morgan MRN: [id number] DOB:12-28-1965, 58 y.o., female Today's Date: 08/15/2023  END OF SESSION:  PT End of Session - 08/15/23 0834     Visit Number 3    Number of Visits 12    Date for PT Re-Evaluation 10/01/23    Authorization Type [employer]    PT Start Time 0830    PT Stop Time 0910    PT Time Calculation (min) 40 min    Activity Tolerance Patient tolerated treatment well    Behavior During Therapy [facility] for tasks assessed/performed           Past Medical History:  Diagnosis Date   Allergy    Anemia    15+ years ago   Arthritis    Asthma    Blood transfusion without reported diagnosis    Depression    Dysrhythmia    Palpitations - Holter monitor in 2024   Family history of adverse reaction to anesthesia    sister had episode with anesthesia once, but no additional information given   Frequent headaches    Resolved as of 2024   History of phlebitis    Hx of adenomatous polyp of colon 08/15/2015   Hypertension    Hypothyroidism    Palpitations    heart monitor all normal per pt.2017   PE (pulmonary embolism)    Past Surgical History:  Procedure Laterality Date   ABDOMINAL HYSTERECTOMY  03/01/2004   CHOLECYSTECTOMY     COLONOSCOPY W/ POLYPECTOMY  07/2020   TOTAL HIP ARTHROPLASTY Right 02/21/2023   Procedure: RIGHT TOTAL HIP ARTHROPLASTY ANTERIOR APPROACH;  Surgeon: Wes Hamman, MD;  Location: [facility] OR;  Service: Orthopedics;  Laterality: Right;  3-C   Patient Active Problem List   Diagnosis Date Noted   Need for vaccination 04/12/2023   Bradycardia 04/12/2023   Hyperplastic colonic polyp 04/06/2023   Psychophysiological insomnia 04/06/2023   Encounter for general adult medical examination with abnormal findings 01/04/2023   Prolonged Q-T interval on ECG 01/04/2023   Seasonal allergic rhinitis due to fungal spores 04/05/2022   Moderate persistent asthma with exacerbation  04/05/2022   Encounter for long-term use of opiate analgesic 02/16/2020   Acquired hypothyroidism 02/12/2020   Chronic hip pain, bilateral 05/22/2019   Essential hypertension 04/07/2016   Snoring 04/07/2016   Hx of adenomatous polyp of colon 08/15/2015   Asthma, mild intermittent 05/29/2015   Hyperlipidemia with target LDL less than 130 05/29/2015   Subacromial bursitis 04/18/2014   Obesity, morbid (HCC) 02/16/2013   Primary osteoarthritis of right hip 02/16/2013   Tobacco abuse disorder 11/19/2012   Visit for screening mammogram 11/17/2012   DJD (degenerative joint disease) of knee 11/17/2012   Chronic pain syndrome 11/17/2012    PCP: Arcadio Knuckles, MD   REFERRING PROVIDER: Wes Hamman, MD  REFERRING DIAG: [phone] (ICD-10-CM) - Bilateral hip pain Z96.641 (ICD-10-CM) - Status post total replacement of right hip M54.50,G89.29 (ICD-10-CM) - Chronic bilateral low back pain without sciatica  THERAPY DIAG:  Pain of both hip joints  Other low back pain  Muscle weakness (generalized)  Rationale for Evaluation and Treatment: Rehabilitation  ONSET DATE: chronic  SUBJECTIVE:   SUBJECTIVE STATEMENT: Arrives to [facility]PPT with elevated pain levels.  Symptoms increased folowinglast PT session.  PERTINENT HISTORY: Bilateral hip pain post-replacement Four months post-hip replacement with persistent bilateral hip pain. No infection or surgical complications. X-ray normal for replaced hip; contralateral hip arthritic. Discussed opioid  risks and non-opioid pain management. - Order blood work to rule out rheumatoid arthritis or other conditions. - Prescribe prednisone  to reduce inflammation. - Advise on opioid risks and addiction potential.   Chronic pain due to arthritis Chronic pain from arthritis affecting multiple joints, consistent with polyarthralgia. - Prescribe prednisone  to reduce inflammation. - Refer to physical therapy for bilateral hip pain.   Low back pain Chronic  low back pain with radiating sharp pains. Suspected contribution to chronic pain syndrome. - Order x-ray of the back. - Refer to physical therapy for low back pain. - Consider MRI if no improvement after six weeks of physical therapy.  PAIN:  Are you having pain? Yes: NPRS scale: 10/10 Pain location: low back and hips Pain description: ache, sore Aggravating factors: activity Relieving factors: supine and heat  PRECAUTIONS: None  RED FLAGS: None   WEIGHT BEARING RESTRICTIONS: No  FALLS:  Has patient fallen in last 6 months? No  OCCUPATION: HH aide  PLOF: Independent  PATIENT GOALS: To manage my hip pain  NEXT MD VISIT: 6 months  OBJECTIVE:  Note: Objective measures were completed at Evaluation unless otherwise noted.  DIAGNOSTIC FINDINGS: X-rays of the pelvis and the right hip show stable right total hip  arthroplasty without any complications.  X-rays of the lumbar spine show diffuse degenerative changes.  Grade 1  spondylolisthesis of L4-5.  PATIENT SURVEYS:  Patient-specific activity scoring scheme (Point to one number):  0 represents "unable to perform." 10 represents "able to perform at prior level. 0 1 2 3 4 5 6 7 8 9  10 (Date and Score) Activity Initial  Activity Eval     walking  5    bending  4    sitting 4   standing 4     Total score = sum of the activity scores/number of activities Minimum detectable change (90%CI) for average score = 2 points Minimum detectable change (90%CI) for single activity score = 3 points PSFS developed by: [place] Spitz., & Binkley, J. (1995). Assessing disability and change on individual  patients: a report of a patient specific measure. Physiotherapy [place], 47, [phone]. Reproduced with the permission of the authors  Score: 17/40 42% perceived function  MUSCLE LENGTH: Hamstrings: Right 80 deg; Left 80 deg Thomas test: painful B  POSTURE: flexed trunk  and flexed  hips  PALPATION: deferred  LUMBAR ROM:   Active  A/PROM  eval  Flexion 75%  Extension 75%  Right lateral flexion 50%  Left lateral flexion 50%  Right rotation   Left rotation    (Blank rows = not tested)   LOWER EXTREMITY ROM:  Passive ROM Right eval Left eval  Hip flexion 90d 90d  Hip extension    Hip abduction    Hip adduction    Hip internal rotation    Hip external rotation    Knee flexion    Knee extension    Ankle dorsiflexion    Ankle plantarflexion    Ankle inversion    Ankle eversion     (Blank rows = not tested)  LOWER EXTREMITY MMT:  MMT Right eval Left eval  Hip flexion    Hip extension    Hip abduction    Hip adduction    Hip internal rotation    Hip external rotation    Knee flexion    Knee extension    Ankle dorsiflexion    Ankle plantarflexion    Ankle inversion    Ankle eversion     (  Blank rows = not tested)  LOWER EXTREMITY SPECIAL TESTS:  Hip special tests: Andy Bannister test: positive , Anterior hip impingement test: negative, and pain with L hip ROM  FUNCTIONAL TESTS:  30 seconds chair stand test  GAIT: Distance walked: 10ft x2 Assistive device utilized: None Level of assistance: Complete Independence Comments: antalgic gait pattern with decreased B hip ROM observed                                                                                                                                TREATMENT DATE: [facility] Adult PT Treatment:                                                DATE: 08/15/23 Therapeutic Exercise: Nustep L2 8 min Neuromuscular re-ed: Supine hip fallouts RTB 15x B, 15/15 unilaterally P-ball curl ups 15x B, 15/15 unilateral Therapeutic Activity: S/L clams RTB 15/15(deferred due to pain) Seated hamstring stretch 30s x2 B Supine hip flexor stretch 30s x2 B SKTC 30s x2 B  [facility] Adult PT Treatment:                                                DATE: 08/08/23 Therapeutic Exercise: Nustep L4 8 min Neuromuscular  re-ed: Supine hip fallouts RTB 15x B, 15/15 unilaterally Bridge against RTB 15x Bridge with ball 15x P-ball curl ups 15x B, 15/15 unilateral Therapeutic Activity: S/L clams RTB 15/15 Seated hamstring stretch 30s x2 B Supine hip flexor stretch 30s x2 B STS 10x arms crossed  [facility] Adult PT Treatment:                                                DATE: 08/01/23 Eval and HEP Self Care: Additional minutes spent for educating on updated Therapeutic Home Exercise Program as well as comparing current status to condition at start of symptoms. This included exercises focusing on stretching, strengthening, with focus on eccentric aspects. Long term goals include an improvement in range of motion, strength, endurance as well as avoiding reinjury. Patient's frequency would include in 1-2 times a day, 3-5 times a week for a duration of 6-12 weeks. Proper technique shown and discussed handout in great detail. All questions were discussed and addressed.      PATIENT EDUCATION:  Education details: Discussed eval findings, rehab rationale and POC and patient is in agreement  Person educated: Patient Education method: Explanation and Handouts Education comprehension: verbalized understanding and needs further education  HOME EXERCISE PROGRAM: Access Code: [email or web address] URL: [email or web address] Date: 08/01/2023 Prepared by:  Gretta Leavens  Exercises - Sit to Stand with Arms Crossed  - 2 x daily - 5 x weekly - 1 sets - Modified Thomas Stretch  - 2 x daily - 5 x weekly - 1 sets - 2 reps - 30s hold - Heel Toe Raises with Counter Support  - 2 x daily - 5 x weekly - 2 sets - 10 reps  ASSESSMENT:  CLINICAL IMPRESSION: Due to elevated symptoms levels, no new tasks added.  Limited resistance but kept exercise program to alow patient to develop a tolerance.  Unable to tolerate bridge or S/L positions today due to B hip pain.  Patient is a 58 y.o. female who was seen today for physical therapy  evaluation and treatment for B hip and low back pain.  Scope of assessment limited by pain and inability to attain testing positions as well as multiple involve body parts.  L hip in need of THA and appears to be driving remainder of symptoms as patient has adopted a flexed hip posture.  B hip flexor tightness observed.  L hip very painful with testing positions/tasks.  Trunk ROM restricted as noted.  Patient reports high pain levels and rates her function at 42%.  OBJECTIVE IMPAIRMENTS: Abnormal gait, decreased activity tolerance, decreased knowledge of condition, decreased mobility, difficulty walking, decreased ROM, decreased strength, improper body mechanics, obesity, and pain.   ACTIVITY LIMITATIONS: carrying, lifting, bending, sitting, squatting, sleeping, and bed mobility  REHAB POTENTIAL: Good  CLINICAL DECISION MAKING: Stable/uncomplicated  EVALUATION COMPLEXITY: Low   GOALS: Goals reviewed with patient? No  SHORT TERM GOALS: Target date: 08/22/2023  Patient to demonstrate independence in HEP  Baseline: Goal status: INITIAL  2.  Assess 2 MWT and establish baseline Baseline: TBD Goal status: INITIAL    LONG TERM GOALS: Target date: 09/12/2023    Patient will increase 30s chair stand reps from 2 to 6 with/without arms to demonstrate and improved functional ability with less pain/difficulty as well as reduce fall risk.  Baseline:  Goal status: INITIAL  2.  Patient will acknowledge 6/10 pain at least once during episode of care   Baseline: 10/10 Goal status: INITIAL  3.  Patient will score at least 50% on PSFS to signify clinically meaningful improvement in functional abilities.   Baseline: 42% Goal status: INITIAL  4.  Re-assess 2 MWT Baseline:  Goal status: INITIAL  5.  100d B hip flexion Baseline: 90d Goal status: INITIAL   PLAN:  PT FREQUENCY: 1-2x/week  PT DURATION: 6 weeks  PLANNED INTERVENTIONS: 97110-Therapeutic exercises, 97530- Therapeutic  activity, 97112- Neuromuscular re-education, 97535- Self Care, [id number]- Manual therapy, Patient/Family education, Balance training, and Stair training  PLAN FOR NEXT SESSION: HEP review and update, manual techniques as appropriate, aerobic tasks, ROM and flexibility activities, strengthening and PREs, TPDN, gait and balance training as needed    For all possible CPT codes, reference the Planned Interventions line above.     Check all conditions that are expected to impact treatment: {Conditions expected to impact treatment:Complications related to surgery   If treatment provided at initial evaluation, no treatment charged due to lack of authorization.        Torey Reinard M Rylea Selway, PT 08/15/2023, 9:13 AM

## 2023-08-16 ENCOUNTER — Encounter: Payer: Self-pay | Admitting: Physical Therapy

## 2023-08-16 ENCOUNTER — Ambulatory Visit: Admitting: Physical Therapy

## 2023-08-16 DIAGNOSIS — M25551 Pain in right hip: Secondary | ICD-10-CM | POA: Diagnosis not present

## 2023-08-16 DIAGNOSIS — M5459 Other low back pain: Secondary | ICD-10-CM

## 2023-08-16 DIAGNOSIS — M6281 Muscle weakness (generalized): Secondary | ICD-10-CM

## 2023-08-16 NOTE — Therapy (Signed)
 OUTPATIENT PHYSICAL THERAPY LOWER EXTREMITY EVALUATION   Patient Name: Denise Morgan MRN: [id number] DOB:1965-11-04, 58 y.o., female Today's Date: 08/16/2023  END OF SESSION:  PT End of Session - 08/16/23 0823     Visit Number 4    Number of Visits 12    Date for PT Re-Evaluation 10/01/23    Authorization Type [facility] [employer]    PT Start Time 0830    PT Stop Time 0910    PT Time Calculation (min) 40 min           Past Medical History:  Diagnosis Date   Allergy    Anemia    15+ years ago   Arthritis    Asthma    Blood transfusion without reported diagnosis    Depression    Dysrhythmia    Palpitations - Holter monitor in 2024   Family history of adverse reaction to anesthesia    sister had episode with anesthesia once, but no additional information given   Frequent headaches    Resolved as of 2024   History of phlebitis    Hx of adenomatous polyp of colon 08/15/2015   Hypertension    Hypothyroidism    Palpitations    heart monitor all normal per pt.2017   PE (pulmonary embolism)    Past Surgical History:  Procedure Laterality Date   ABDOMINAL HYSTERECTOMY  03/01/2004   CHOLECYSTECTOMY     COLONOSCOPY W/ POLYPECTOMY  07/2020   TOTAL HIP ARTHROPLASTY Right 02/21/2023   Procedure: RIGHT TOTAL HIP ARTHROPLASTY ANTERIOR APPROACH;  Surgeon: Wes Hamman, MD;  Location: [facility] OR;  Service: Orthopedics;  Laterality: Right;  3-C   Patient Active Problem List   Diagnosis Date Noted   Need for vaccination 04/12/2023   Bradycardia 04/12/2023   Hyperplastic colonic polyp 04/06/2023   Psychophysiological insomnia 04/06/2023   Encounter for general adult medical examination with abnormal findings 01/04/2023   Prolonged Q-T interval on ECG 01/04/2023   Seasonal allergic rhinitis due to fungal spores 04/05/2022   Moderate persistent asthma with exacerbation 04/05/2022   Encounter for long-term use of opiate analgesic 02/16/2020   Acquired hypothyroidism 02/12/2020    Chronic hip pain, bilateral 05/22/2019   Essential hypertension 04/07/2016   Snoring 04/07/2016   Hx of adenomatous polyp of colon 08/15/2015   Asthma, mild intermittent 05/29/2015   Hyperlipidemia with target LDL less than 130 05/29/2015   Subacromial bursitis 04/18/2014   Obesity, morbid (HCC) 02/16/2013   Primary osteoarthritis of right hip 02/16/2013   Tobacco abuse disorder 11/19/2012   Visit for screening mammogram 11/17/2012   DJD (degenerative joint disease) of knee 11/17/2012   Chronic pain syndrome 11/17/2012    PCP: Arcadio Knuckles, MD   REFERRING PROVIDER: Wes Hamman, MD  REFERRING DIAG: [id number] (ICD-10-CM) - Bilateral hip pain Z96.641 (ICD-10-CM) - Status post total replacement of right hip M54.50,G89.29 (ICD-10-CM) - Chronic bilateral low back pain without sciatica  THERAPY DIAG:  Pain of both hip joints  Other low back pain  Muscle weakness (generalized)  Rationale for Evaluation and Treatment: Rehabilitation  ONSET DATE: chronic  SUBJECTIVE:   SUBJECTIVE STATEMENT:  Pt attended today's session with reports of 6/10 pain. Pt stated that they have maintained good compliance with current HEP.      PERTINENT HISTORY: Bilateral hip pain post-replacement Four months post-hip replacement with persistent bilateral hip pain. No infection or surgical complications. X-ray normal for replaced hip; contralateral hip arthritic. Discussed opioid risks and non-opioid pain management. - Order  blood work to rule out rheumatoid arthritis or other conditions. - Prescribe prednisone  to reduce inflammation. - Advise on opioid risks and addiction potential.   Chronic pain due to arthritis Chronic pain from arthritis affecting multiple joints, consistent with polyarthralgia. - Prescribe prednisone  to reduce inflammation. - Refer to physical therapy for bilateral hip pain.   Low back pain Chronic low back pain with radiating sharp pains. Suspected contribution to  chronic pain syndrome. - Order x-ray of the back. - Refer to physical therapy for low back pain. - Consider MRI if no improvement after six weeks of physical therapy.  PAIN:  Are you having pain? Yes: NPRS scale: 10/10 Pain location: low back and hips Pain description: ache, sore Aggravating factors: activity Relieving factors: supine and heat  PRECAUTIONS: None  RED FLAGS: None   WEIGHT BEARING RESTRICTIONS: No  FALLS:  Has patient fallen in last 6 months? No  OCCUPATION: HH aide  PLOF: Independent  PATIENT GOALS: To manage my hip pain  NEXT MD VISIT: 6 months  OBJECTIVE:  Note: Objective measures were completed at Evaluation unless otherwise noted.  DIAGNOSTIC FINDINGS: X-rays of the pelvis and the right hip show stable right total hip  arthroplasty without any complications.  X-rays of the lumbar spine show diffuse degenerative changes.  Grade 1  spondylolisthesis of L4-5.  PATIENT SURVEYS:  Patient-specific activity scoring scheme (Point to one number):  0 represents "unable to perform." 10 represents "able to perform at prior level. 0 1 2 3 4 5 6 7 8 9  10 (Date and Score) Activity Initial  Activity Eval     walking  5    bending  4    sitting 4   standing 4     Total score = sum of the activity scores/number of activities Minimum detectable change (90%CI) for average score = 2 points Minimum detectable change (90%CI) for single activity score = 3 points PSFS developed by: [place] Spitz., & Binkley, J. (1995). Assessing disability and change on individual  patients: a report of a patient specific measure. Physiotherapy [place], [phone], [phone]. Reproduced with the permission of the authors  Score: 17/40 42% perceived function  MUSCLE LENGTH: Hamstrings: Right 80 deg; Left 80 deg Thomas test: painful B  POSTURE: flexed trunk  and flexed hips  PALPATION: deferred  LUMBAR ROM:   Active  A/PROM  eval  Flexion 75%   Extension 75%  Right lateral flexion 50%  Left lateral flexion 50%  Right rotation   Left rotation    (Blank rows = not tested)   LOWER EXTREMITY ROM:  Passive ROM Right eval Left eval  Hip flexion 90d 90d  Hip extension    Hip abduction    Hip adduction    Hip internal rotation    Hip external rotation    Knee flexion    Knee extension    Ankle dorsiflexion    Ankle plantarflexion    Ankle inversion    Ankle eversion     (Blank rows = not tested)  LOWER EXTREMITY MMT:  MMT Right eval Left eval  Hip flexion    Hip extension    Hip abduction    Hip adduction    Hip internal rotation    Hip external rotation    Knee flexion    Knee extension    Ankle dorsiflexion    Ankle plantarflexion    Ankle inversion    Ankle eversion     (Blank rows =  not tested)  LOWER EXTREMITY SPECIAL TESTS:  Hip special tests: Andy Bannister test: positive , Anterior hip impingement test: negative, and pain with L hip ROM  FUNCTIONAL TESTS:  30 seconds chair stand test  GAIT: Distance walked: 20ft x2 Assistive device utilized: None Level of assistance: Complete Independence Comments: antalgic gait pattern with decreased B hip ROM observed                                                                                                                                TREATMENT: [facility] Adult PT Treatment:                                                DATE: 08/16/2023  Therapeutic Activity: NuStep 8' for activity tolerance Symptom provocation testing for further diagnostics Manual Trigger point release of quadriceps muscle group, TFL, distal iliopsoas Pt reported symptom reproduction with ease following completion of intervention    [facility] Adult PT Treatment:                                                DATE: 08/15/23 Therapeutic Exercise: Nustep L2 8 min Neuromuscular re-ed: Supine hip fallouts RTB 15x B, 15/15 unilaterally P-ball curl ups 15x B, 15/15 unilateral Therapeutic  Activity: S/L clams RTB 15/15(deferred due to pain) Seated hamstring stretch 30s x2 B Supine hip flexor stretch 30s x2 B SKTC 30s x2 B   [facility] Adult PT Treatment:                                                DATE: 08/01/23 Eval and HEP Self Care: Additional minutes spent for educating on updated Therapeutic Home Exercise Program as well as comparing current status to condition at start of symptoms. This included exercises focusing on stretching, strengthening, with focus on eccentric aspects. Long term goals include an improvement in range of motion, strength, endurance as well as avoiding reinjury. Patient's frequency would include in 1-2 times a day, 3-5 times a week for a duration of 6-12 weeks. Proper technique shown and discussed handout in great detail. All questions were discussed and addressed.      PATIENT EDUCATION:  Education details: Discussed eval findings, rehab rationale and POC and patient is in agreement  Person educated: Patient Education method: Explanation and Handouts Education comprehension: verbalized understanding and needs further education  HOME EXERCISE PROGRAM: Access Code: [email or web address] URL: [email or web address] Date: 08/01/2023 Prepared by: Gretta Leavens  Exercises - Sit to Stand with Arms Crossed  - 2 x daily - 5 x weekly -  1 sets - Modified Thomas Stretch  - 2 x daily - 5 x weekly - 1 sets - 2 reps - 30s hold - Heel Toe Raises with Counter Support  - 2 x daily - 5 x weekly - 2 sets - 10 reps  ASSESSMENT:  CLINICAL IMPRESSION:  Pt attended physical therapy session for continuation of treatment regarding B hip pain with R being the focus. Today's treatment focused on improvement of  R hip flexor/ abductor, and knee extensor motility. Provocation testing demonstrated significant guarding of these muscle groups with symptom reproduction at the anterior hip, once treated pt reported significant improvement in pain. Pt was also educated on  desensitization of surgical scar for at home management Pt showed good tolerance to administered treatment with no adverse effects by the end of session. Skilled intervention was utilized via activity modification for pt tolerance with task completion, functional progression/regression promoting best outcomes inline with current rehab goals, as well as minimal verbal/tactile cuing alongside no physical assistance for safe and appropriate performance of today's activities. F/u on pain levels following today's manual techniques.   Patient is a 58 y.o. female who was seen today for physical therapy evaluation and treatment for B hip and low back pain.  Scope of assessment limited by pain and inability to attain testing positions as well as multiple involve body parts.  L hip in need of THA and appears to be driving remainder of symptoms as patient has adopted a flexed hip posture.  B hip flexor tightness observed.  L hip very painful with testing positions/tasks.  Trunk ROM restricted as noted.  Patient reports high pain levels and rates her function at 42%.  OBJECTIVE IMPAIRMENTS: Abnormal gait, decreased activity tolerance, decreased knowledge of condition, decreased mobility, difficulty walking, decreased ROM, decreased strength, improper body mechanics, obesity, and pain.   ACTIVITY LIMITATIONS: carrying, lifting, bending, sitting, squatting, sleeping, and bed mobility  REHAB POTENTIAL: Good  CLINICAL DECISION MAKING: Stable/uncomplicated  EVALUATION COMPLEXITY: Low   GOALS: Goals reviewed with patient? No  SHORT TERM GOALS: Target date: 08/22/2023  Patient to demonstrate independence in HEP  Baseline: Goal status: INITIAL  2.  Assess 2 MWT and establish baseline Baseline: TBD Goal status: INITIAL    LONG TERM GOALS: Target date: 09/12/2023    Patient will increase 30s chair stand reps from 2 to 6 with/without arms to demonstrate and improved functional ability with less  pain/difficulty as well as reduce fall risk.  Baseline:  Goal status: INITIAL  2.  Patient will acknowledge 6/10 pain at least once during episode of care   Baseline: 10/10 Goal status: INITIAL  3.  Patient will score at least 50% on PSFS to signify clinically meaningful improvement in functional abilities.   Baseline: 42% Goal status: INITIAL  4.  Re-assess 2 MWT Baseline:  Goal status: INITIAL  5.  100d B hip flexion Baseline: 90d Goal status: INITIAL   PLAN:  PT FREQUENCY: 1-2x/week  PT DURATION: 6 weeks  PLANNED INTERVENTIONS: 97110-Therapeutic exercises, 97530- Therapeutic activity, 97112- Neuromuscular re-education, 97535- Self Care, 91478- Manual therapy, Patient/Family education, Balance training, and Stair training  PLAN FOR NEXT SESSION: HEP review and update, manual techniques as appropriate, aerobic tasks, ROM and flexibility activities, strengthening and PREs, TPDN, gait and balance training as needed    For all possible CPT codes, reference the Planned Interventions line above.     Check all conditions that are expected to impact treatment: {Conditions expected to impact treatment:Complications related to surgery  If treatment provided at initial evaluation, no treatment charged due to lack of authorization.        Bunny Caroli, PT 08/16/2023, 9:21 AM

## 2023-08-21 NOTE — Therapy (Unsigned)
 OUTPATIENT PHYSICAL THERAPY TREATMENT NOTE   Patient Name: Denise Morgan MRN: [id number] DOB:1965/10/02, 58 y.o., female Today's Date: 08/22/2023  END OF SESSION:  PT End of Session - 08/22/23 0829     Visit Number 5    Number of Visits 12    Date for PT Re-Evaluation 10/01/23    Authorization Type [employer]    PT Start Time 0830    PT Stop Time 0915    PT Time Calculation (min) 45 min    Activity Tolerance Patient tolerated treatment well    Behavior During Therapy [facility] for tasks assessed/performed            Past Medical History:  Diagnosis Date   Allergy    Anemia    15+ years ago   Arthritis    Asthma    Blood transfusion without reported diagnosis    Depression    Dysrhythmia    Palpitations - Holter monitor in 2024   Family history of adverse reaction to anesthesia    sister had episode with anesthesia once, but no additional information given   Frequent headaches    Resolved as of 2024   History of phlebitis    Hx of adenomatous polyp of colon 08/15/2015   Hypertension    Hypothyroidism    Palpitations    heart monitor all normal per pt.2017   PE (pulmonary embolism)    Past Surgical History:  Procedure Laterality Date   ABDOMINAL HYSTERECTOMY  03/01/2004   CHOLECYSTECTOMY     COLONOSCOPY W/ POLYPECTOMY  07/2020   TOTAL HIP ARTHROPLASTY Right 02/21/2023   Procedure: RIGHT TOTAL HIP ARTHROPLASTY ANTERIOR APPROACH;  Surgeon: Jerri Kay HERO, MD;  Location: [facility] OR;  Service: Orthopedics;  Laterality: Right;  3-C   Patient Active Problem List   Diagnosis Date Noted   Need for vaccination 04/12/2023   Bradycardia 04/12/2023   Hyperplastic colonic polyp 04/06/2023   Psychophysiological insomnia 04/06/2023   Encounter for general adult medical examination with abnormal findings 01/04/2023   Prolonged Q-T interval on ECG 01/04/2023   Seasonal allergic rhinitis due to fungal spores 04/05/2022   Moderate persistent asthma with exacerbation 04/05/2022    Encounter for long-term use of opiate analgesic 02/16/2020   Acquired hypothyroidism 02/12/2020   Chronic hip pain, bilateral 05/22/2019   Essential hypertension 04/07/2016   Snoring 04/07/2016   Hx of adenomatous polyp of colon 08/15/2015   Asthma, mild intermittent 05/29/2015   Hyperlipidemia with target LDL less than 130 05/29/2015   Subacromial bursitis 04/18/2014   Obesity, morbid (HCC) 02/16/2013   Primary osteoarthritis of right hip 02/16/2013   Tobacco abuse disorder 11/19/2012   Visit for screening mammogram 11/17/2012   DJD (degenerative joint disease) of knee 11/17/2012   Chronic pain syndrome 11/17/2012    PCP: Joshua Debby CROME, MD   REFERRING PROVIDER: Jerri Kay HERO, MD  REFERRING DIAG: [id number] (ICD-10-CM) - Bilateral hip pain Z96.641 (ICD-10-CM) - Status post total replacement of right hip M54.50,G89.29 (ICD-10-CM) - Chronic bilateral low back pain without sciatica  THERAPY DIAG:  Pain of both hip joints  Other low back pain  Muscle weakness (generalized)  Rationale for Evaluation and Treatment: Rehabilitation  ONSET DATE: chronic  SUBJECTIVE:   SUBJECTIVE STATEMENT: Arrives with continued pain.  No improvement with stretching and exercises, the more active she is the more it hurts.    PERTINENT HISTORY: Bilateral hip pain post-replacement Four months post-hip replacement with persistent bilateral hip pain. No infection or surgical complications. X-ray  normal for replaced hip; contralateral hip arthritic. Discussed opioid risks and non-opioid pain management. - Order blood work to rule out rheumatoid arthritis or other conditions. - Prescribe prednisone  to reduce inflammation. - Advise on opioid risks and addiction potential.   Chronic pain due to arthritis Chronic pain from arthritis affecting multiple joints, consistent with polyarthralgia. - Prescribe prednisone  to reduce inflammation. - Refer to physical therapy for bilateral hip pain.    Low back pain Chronic low back pain with radiating sharp pains. Suspected contribution to chronic pain syndrome. - Order x-ray of the back. - Refer to physical therapy for low back pain. - Consider MRI if no improvement after six weeks of physical therapy.  PAIN:  Are you having pain? Yes: NPRS scale: 10/10 Pain location: low back and hips Pain description: ache, sore Aggravating factors: activity Relieving factors: supine and heat  PRECAUTIONS: None  RED FLAGS: None   WEIGHT BEARING RESTRICTIONS: No  FALLS:  Has patient fallen in last 6 months? No  OCCUPATION: HH aide  PLOF: Independent  PATIENT GOALS: To manage my hip pain  NEXT MD VISIT: 6 months  OBJECTIVE:  Note: Objective measures were completed at Evaluation unless otherwise noted.  DIAGNOSTIC FINDINGS: X-rays of the pelvis and the right hip show stable right total hip  arthroplasty without any complications.  X-rays of the lumbar spine show diffuse degenerative changes.  Grade 1  spondylolisthesis of L4-5.  PATIENT SURVEYS:  Patient-specific activity scoring scheme (Point to one number):  0 represents "unable to perform." 10 represents "able to perform at prior level. 0 1 2 3 4 5 6 7 8 9  10 (Date and Score) Activity Initial  Activity Eval     walking  5    bending  4    sitting 4   standing 4     Total score = sum of the activity scores/number of activities Minimum detectable change (90%CI) for average score = 2 points Minimum detectable change (90%CI) for single activity score = 3 points PSFS developed by: Rosalee MYRTIS Marvis KYM Charlet CHRISTELLA., & Binkley, J. (1995). Assessing disability and change on individual  patients: a report of a patient specific measure. Physiotherapy [place], 47, [phone]. Reproduced with the permission of the authors  Score: 17/40 42% perceived function  MUSCLE LENGTH: Hamstrings: Right 80 deg; Left 80 deg Thomas test: painful B  POSTURE: flexed trunk  and flexed  hips  PALPATION: deferred  LUMBAR ROM:   Active  A/PROM  eval  Flexion 75%  Extension 75%  Right lateral flexion 50%  Left lateral flexion 50%  Right rotation   Left rotation    (Blank rows = not tested)   LOWER EXTREMITY ROM:  Passive ROM Right eval Left eval  Hip flexion 90d 90d  Hip extension    Hip abduction    Hip adduction    Hip internal rotation    Hip external rotation    Knee flexion    Knee extension    Ankle dorsiflexion    Ankle plantarflexion    Ankle inversion    Ankle eversion     (Blank rows = not tested)  LOWER EXTREMITY MMT:  MMT Right eval Left eval  Hip flexion    Hip extension    Hip abduction    Hip adduction    Hip internal rotation    Hip external rotation    Knee flexion    Knee extension    Ankle dorsiflexion    Ankle plantarflexion  Ankle inversion    Ankle eversion     (Blank rows = not tested)  LOWER EXTREMITY SPECIAL TESTS:  Hip special tests: Debby test: positive , Anterior hip impingement test: negative, and pain with L hip ROM  FUNCTIONAL TESTS:  30 seconds chair stand test  GAIT: Distance walked: 34ft x2 Assistive device utilized: None Level of assistance: Complete Independence Comments: antalgic gait pattern with decreased B hip ROM observed                                                                                                                                TREATMENT: [facility] Adult PT Treatment:                                                DATE: 08/22/23 Therapeutic Exercise: Nustep L2 8 min Neuromuscular re-ed: Supine hip fallouts RTB 15x B, 15/15 unilaterally P-ball curl ups 15x B, 15/15 unilateral Therapeutic Activity: S/L clams RTB 15/15 Seated hamstring stretch 30s x2 B Supine hip flexor stretch 30s x2 B  [facility] Adult PT Treatment:                                                DATE: 08/16/2023  Therapeutic Activity: NuStep 8' for activity tolerance Symptom provocation testing for  further diagnostics Manual Trigger point release of quadriceps muscle group, TFL, distal iliopsoas Pt reported symptom reproduction with ease following completion of intervention    [facility] Adult PT Treatment:                                                DATE: 08/15/23 Therapeutic Exercise: Nustep L2 8 min Neuromuscular re-ed: Supine hip fallouts GTB 15x B, 15/15 unilaterally P-ball curl ups 15x B, 15/15 unilateral Bridge against GTB 10x Bridge with ball 10x Therapeutic Activity: S/L clams GTB 15/15(deferred due to pain) Seated hamstring stretch 30s x2 B Supine hip flexor stretch 30s x2 B SKTC 30s x2 B   [facility] Adult PT Treatment:                                                DATE: 08/01/23 Eval and HEP Self Care: Additional minutes spent for educating on updated Therapeutic Home Exercise Program as well as comparing current status to condition at start of symptoms. This included exercises focusing on stretching, strengthening, with focus on eccentric aspects. Long term goals include an  improvement in range of motion, strength, endurance as well as avoiding reinjury. Patient's frequency would include in 1-2 times a day, 3-5 times a week for a duration of 6-12 weeks. Proper technique shown and discussed handout in great detail. All questions were discussed and addressed.      PATIENT EDUCATION:  Education details: Discussed eval findings, rehab rationale and POC and patient is in agreement  Person educated: Patient Education method: Explanation and Handouts Education comprehension: verbalized understanding and needs further education  HOME EXERCISE PROGRAM: Access Code: [email or web address] URL: [email or web address] Date: 08/01/2023 Prepared by: Reyes Kohut  Exercises - Sit to Stand with Arms Crossed  - 2 x daily - 5 x weekly - 1 sets - Modified Thomas Stretch  - 2 x daily - 5 x weekly - 1 sets - 2 reps - 30s hold - Heel Toe Raises with Counter Support  - 2 x daily - 5 x  weekly - 2 sets - 10 reps  ASSESSMENT:  CLINICAL IMPRESSION: Continued high pain levels, 6/10.  No distinct benefit from PT at [facility] time.  Continued R hip pain and limited mobility despite THA 12/24.  Patient will attempt to f/u with ortho ASAP to address ongoing symptoms.  Patient able to tolerate S/L clamshell exercises today   Patient is a 57 y.o. female who was seen today for physical therapy evaluation and treatment for B hip and low back pain.  Scope of assessment limited by pain and inability to attain testing positions as well as multiple involve body parts.  L hip in need of THA and appears to be driving remainder of symptoms as patient has adopted a flexed hip posture.  B hip flexor tightness observed.  L hip very painful with testing positions/tasks.  Trunk ROM restricted as noted.  Patient reports high pain levels and rates her function at 42%.  OBJECTIVE IMPAIRMENTS: Abnormal gait, decreased activity tolerance, decreased knowledge of condition, decreased mobility, difficulty walking, decreased ROM, decreased strength, improper body mechanics, obesity, and pain.   ACTIVITY LIMITATIONS: carrying, lifting, bending, sitting, squatting, sleeping, and bed mobility  REHAB POTENTIAL: Good  CLINICAL DECISION MAKING: Stable/uncomplicated  EVALUATION COMPLEXITY: Low   GOALS: Goals reviewed with patient? No  SHORT TERM GOALS: Target date: 08/22/2023  Patient to demonstrate independence in HEP  Baseline: Goal status: INITIAL  2.  Assess 2 MWT and establish baseline Baseline: TBD Goal status: INITIAL    LONG TERM GOALS: Target date: 09/12/2023    Patient will increase 30s chair stand reps from 2 to 6 with/without arms to demonstrate and improved functional ability with less pain/difficulty as well as reduce fall risk.  Baseline:  Goal status: INITIAL  2.  Patient will acknowledge 6/10 pain at least once during episode of care   Baseline: 10/10 Goal status: INITIAL  3.   Patient will score at least 50% on PSFS to signify clinically meaningful improvement in functional abilities.   Baseline: 42% Goal status: INITIAL  4.  Re-assess 2 MWT Baseline:  Goal status: INITIAL  5.  100d B hip flexion Baseline: 90d Goal status: INITIAL   PLAN:  PT FREQUENCY: 1-2x/week  PT DURATION: 6 weeks  PLANNED INTERVENTIONS: 97110-Therapeutic exercises, 97530- Therapeutic activity, 97112- Neuromuscular re-education, 97535- Self Care, 02859- Manual therapy, Patient/Family education, Balance training, and Stair training  PLAN FOR NEXT SESSION: HEP review and update, manual techniques as appropriate, aerobic tasks, ROM and flexibility activities, strengthening and PREs, TPDN, gait and balance training as needed    For all possible  CPT codes, reference the Planned Interventions line above.     Check all conditions that are expected to impact treatment: {Conditions expected to impact treatment:Complications related to surgery   If treatment provided at initial evaluation, no treatment charged due to lack of authorization.        Jandel Patriarca M Gali Spinney, PT 08/22/2023, 9:15 AM

## 2023-08-22 ENCOUNTER — Ambulatory Visit: Admitting: Internal Medicine

## 2023-08-22 ENCOUNTER — Ambulatory Visit

## 2023-08-22 ENCOUNTER — Telehealth: Payer: Self-pay

## 2023-08-22 ENCOUNTER — Encounter: Payer: Self-pay | Admitting: Internal Medicine

## 2023-08-22 VITALS — BP 132/68 | HR 98 | Temp 98.6°F | Resp 16 | Ht 66.0 in | Wt 235.0 lb

## 2023-08-22 DIAGNOSIS — M170 Bilateral primary osteoarthritis of knee: Secondary | ICD-10-CM | POA: Diagnosis not present

## 2023-08-22 DIAGNOSIS — E785 Hyperlipidemia, unspecified: Secondary | ICD-10-CM | POA: Diagnosis not present

## 2023-08-22 DIAGNOSIS — M160 Bilateral primary osteoarthritis of hip: Secondary | ICD-10-CM | POA: Diagnosis not present

## 2023-08-22 DIAGNOSIS — I10 Essential (primary) hypertension: Secondary | ICD-10-CM | POA: Diagnosis not present

## 2023-08-22 DIAGNOSIS — F5104 Psychophysiologic insomnia: Secondary | ICD-10-CM

## 2023-08-22 DIAGNOSIS — Z79891 Long term (current) use of opiate analgesic: Secondary | ICD-10-CM

## 2023-08-22 DIAGNOSIS — M6281 Muscle weakness (generalized): Secondary | ICD-10-CM

## 2023-08-22 DIAGNOSIS — T50905A Adverse effect of unspecified drugs, medicaments and biological substances, initial encounter: Secondary | ICD-10-CM | POA: Diagnosis not present

## 2023-08-22 DIAGNOSIS — E039 Hypothyroidism, unspecified: Secondary | ICD-10-CM

## 2023-08-22 DIAGNOSIS — M25551 Pain in right hip: Secondary | ICD-10-CM | POA: Diagnosis not present

## 2023-08-22 DIAGNOSIS — Z6837 Body mass index (BMI) 37.0-37.9, adult: Secondary | ICD-10-CM

## 2023-08-22 DIAGNOSIS — Z1211 Encounter for screening for malignant neoplasm of colon: Secondary | ICD-10-CM

## 2023-08-22 DIAGNOSIS — E876 Hypokalemia: Secondary | ICD-10-CM

## 2023-08-22 DIAGNOSIS — Z1231 Encounter for screening mammogram for malignant neoplasm of breast: Secondary | ICD-10-CM

## 2023-08-22 DIAGNOSIS — Z860101 Personal history of adenomatous and serrated colon polyps: Secondary | ICD-10-CM

## 2023-08-22 DIAGNOSIS — M5459 Other low back pain: Secondary | ICD-10-CM

## 2023-08-22 LAB — LIPID PANEL
Cholesterol: 286 mg/dL — ABNORMAL HIGH (ref 0–200)
HDL: 68.7 mg/dL (ref >39.00)
LDL Cholesterol: 198 mg/dL — ABNORMAL HIGH (ref 0–99)
NonHDL: 217.79
Total CHOL/HDL Ratio: 4
Triglycerides: 97 mg/dL (ref 0.0–149.0)
VLDL: 19.4 mg/dL (ref 0.0–40.0)

## 2023-08-22 LAB — TSH: TSH: 1 u[IU]/mL (ref 0.35–5.50)

## 2023-08-22 LAB — HEPATIC FUNCTION PANEL
ALT: 13 U/L (ref 0–35)
AST: 15 U/L (ref 0–37)
Albumin: 4.1 g/dL (ref 3.5–5.2)
Alkaline Phosphatase: 64 U/L (ref 39–117)
Bilirubin, Direct: 0.2 mg/dL (ref 0.0–0.3)
Total Bilirubin: 0.9 mg/dL (ref 0.2–1.2)
Total Protein: 7 g/dL (ref 6.0–8.3)

## 2023-08-22 LAB — MAGNESIUM: Magnesium: 1.9 mg/dL (ref 1.5–2.5)

## 2023-08-22 LAB — HEMOGLOBIN A1C: Hgb A1c MFr Bld: 5.8 % (ref 4.6–6.5)

## 2023-08-22 MED ORDER — QUETIAPINE FUMARATE 300 MG PO TABS: 300.0000 mg | ORAL_TABLET | Freq: Every day | ORAL | 0 refills | Status: DC

## 2023-08-22 MED ORDER — OXYCODONE HCL 10 MG PO TABS: 10.0000 mg | ORAL_TABLET | Freq: Three times a day (TID) | ORAL | 0 refills | Status: DC | PRN

## 2023-08-22 MED ORDER — NALOXONE HCL 4 MG/0.1ML NA LIQD: 1.0000 | Freq: Once | NASAL | 2 refills | Status: AC

## 2023-08-22 MED ORDER — MELOXICAM 15 MG PO TABS
15.0000 mg | ORAL_TABLET | Freq: Every day | ORAL | 0 refills | Status: DC
Start: 2023-08-22 — End: 2023-11-24

## 2023-08-22 NOTE — Patient Instructions (Signed)

## 2023-08-22 NOTE — Telephone Encounter (Signed)
 Pharmacy Patient Advocate Encounter   Received notification from CoverMyMeds that prior authorization for QUEtiapine  Fumarate 300MG  tablets is required/requested.   Insurance verification completed.   The patient is insured through [facility] .   Per test claim: PA required; PA submitted to above mentioned insurance via CoverMyMeds Key/confirmation #/EOC [id number] Status is pending

## 2023-08-22 NOTE — Progress Notes (Unsigned)
 Subjective:  Patient ID: Denise Morgan, female    DOB: 11-29-1965  Age: 58 y.o. MRN: [id number]  CC: Hip Pain (Left been hurting a long time. Right one was replaced in December ), Hypertension, Osteoarthritis, and Hypothyroidism   HPI Denise Morgan presents for f/up   ---  Discussed the use of AI scribe software for clinical note transcription with the patient, who gave verbal consent to proceed.  History of Present Illness   Denise Morgan is a 58 year old female who presents with vertigo and hip pain.  In April, she experienced a severe episode of vertigo described as 'horrible', accompanied by nausea and vomiting. These symptoms have not recurred in the past week, but she is seeking medication to manage potential future episodes.  She reports numbness in her right hand, which she attributes to her arm hanging off the bed, causing swelling that resembles a 'balloon' before subsiding. No headaches, blurred vision, or difficulty walking or talking beyond her usual gait issues.  She is experiencing significant pain in her right hip, which persists despite undergoing physical therapy. She is nearing the end of her therapy sessions. She believes her left leg is causing additional pain due to a leg length discrepancy. She is currently taking oxycodone  5 mg, three at a time, but reports it is ineffective. She also takes Tylenol  and Aleve for pain management.  She has gained weight, which she attributes to dietary choices, specifically 'cookies'. She denies taking prednisone  or amoxicillin  currently. She is not engaging in physical activities that elevate her heart rate and denies experiencing chest pain, shortness of breath, dizziness, or lightheadedness during therapy.  She reports that Seroquel  50 mg is ineffective for sleep, requiring her to take four tablets at a time without achieving desired results. She previously found 300 mg effective for sleep.  She has removed herself from  work to reduce stress, which she found increasingly difficult to manage.       Outpatient Medications Prior to Visit  Medication Sig Dispense Refill   acetaminophen  (TYLENOL ) 650 MG CR tablet Take 650 mg by mouth every 8 (eight) hours as needed for pain.     Albuterol -Budesonide  (AIRSUPRA ) 90-80 MCG/ACT AERO Inhale 2 puffs into the lungs 3 (three) times daily as needed (shortness of breath).     fluticasone  (FLONASE ) 50 MCG/ACT nasal spray Place 2 sprays into both nostrils daily. 48 g 1   indapamide  (LOZOL ) 2.5 MG tablet TAKE 1 TABLET BY MOUTH EVERY DAY 90 tablet 0   levothyroxine  (SYNTHROID ) 50 MCG tablet TAKE 1 TABLET BY MOUTH EVERY DAY BEFORE BREAKFAST 90 tablet 1   meclizine  (ANTIVERT ) 25 MG tablet Take 1 tablet (25 mg total) by mouth 3 (three) times daily as needed for dizziness. 20 tablet 0   nadolol  (CORGARD ) 20 MG tablet TAKE 1 TABLET BY MOUTH EVERY DAY 90 tablet 0   potassium chloride  (KLOR-CON ) 8 MEQ tablet TAKE 1 TABLET (8 MEQ TOTAL) BY MOUTH 3 (THREE) TIMES DAILY. 270 tablet 0   ibuprofen  (ADVIL ) 200 MG tablet Take 200 mg by mouth every 6 (six) hours as needed for moderate pain (pain score 4-6).     meloxicam  (MOBIC ) 15 MG tablet Take 15 mg by mouth daily.     oxyCODONE  (ROXICODONE ) 5 MG immediate release tablet Take 1 tablet (5 mg total) by mouth every 8 (eight) hours as needed for severe pain (pain score 7-10). 20 tablet 0   QUEtiapine  (SEROQUEL ) 50 MG tablet Take 1 tablet (  50 mg total) by mouth at bedtime. 90 tablet 0   amoxicillin  (AMOXIL ) 500 MG capsule Take 4 pills one hour prior to dental work (Patient not taking: Reported on 07/27/2023) 8 capsule 2   docusate sodium  (COLACE) 100 MG capsule Take 1 capsule (100 mg total) by mouth daily as needed. 30 capsule 2   traZODone  (DESYREL ) 100 MG tablet TAKE 2 TABLETS BY MOUTH AT BEDTIME. (Patient not taking: Reported on 08/22/2023) 180 tablet 0   methocarbamol  (ROBAXIN -750) 750 MG tablet Take 1 tablet (750 mg total) by mouth 2 (two) times  daily as needed for muscle spasms. 20 tablet 2   predniSONE  (STERAPRED UNI-PAK 21 TAB) 10 MG (21) TBPK tablet Take as directed (Patient not taking: Reported on 07/27/2023) 21 tablet 3   No facility-administered medications prior to visit.    ROS Review of Systems  Objective:  BP 132/68 (BP Location: Right Arm, Patient Position: Sitting, Cuff Size: Normal)   Pulse 98   Temp 98.6 F (37 C) (Oral)   Resp 16   Ht 5' 6 (1.676 m)   Wt 235 lb (106.6 kg)   LMP 03/01/2004   SpO2 (!) 59%   BMI 37.93 kg/m   BP Readings from Last 3 Encounters:  08/22/23 132/68  07/27/23 (!) 154/94  06/28/23 (!) 123/59    Wt Readings from Last 3 Encounters:  08/22/23 235 lb (106.6 kg)  07/27/23 227 lb (103 kg)  06/28/23 230 lb (104.3 kg)    Physical Exam  Lab Results  Component Value Date   WBC 8.6 06/28/2023   HGB 14.4 06/28/2023   HCT 41.2 06/28/2023   PLT 316 06/28/2023   GLUCOSE 110 (H) 06/28/2023   CHOL 272 (H) 05/13/2022   TRIG 70.0 05/13/2022   HDL 99.10 05/13/2022   LDLDIRECT 155.6 02/16/2013   LDLCALC 159 (H) 05/13/2022   ALT 18 05/13/2022   AST 18 05/13/2022   NA 135 06/28/2023   K 3.2 (L) 06/28/2023   CL 99 06/28/2023   CREATININE 0.67 06/28/2023   BUN 12 06/28/2023   CO2 24 06/28/2023   TSH 1.41 01/04/2023   INR 3.2 11/24/2012    CT Angio Chest PE W and/or Wo Contrast Result Date: 06/28/2023 CLINICAL DATA:  Pulmonary embolism (PE) suspected, low to intermediate prob, positive D-dimer EXAM: CT ANGIOGRAPHY CHEST WITH CONTRAST TECHNIQUE: Multidetector CT imaging of the chest was performed using the standard protocol during bolus administration of intravenous contrast. Multiplanar CT image reconstructions and MIPs were obtained to evaluate the vascular anatomy. RADIATION DOSE REDUCTION: This exam was performed according to the departmental dose-optimization program which includes automated exposure control, adjustment of the mA and/or kV according to patient size and/or use of  iterative reconstruction technique. CONTRAST:  75mL OMNIPAQUE  IOHEXOL  350 MG/ML SOLN COMPARISON:  April 27, 2023 FINDINGS: Pulmonary Embolism: No pulmonary embolism. Cardiovascular: No cardiomegaly or pericardial effusion. No aortic aneurysm. Scattered aortic atherosclerosis. Mediastinum/Nodes: No mediastinal mass. No mediastinal, hilar, or axillary lymphadenopathy. Lungs/Pleura: The midline trachea and bronchi are patent. Mild centrilobular emphysema. No focal airspace consolidation, pleural effusion, or pneumothorax. Minimal subpleural scarring or atelectasis in the right lower lobe. Musculoskeletal: No acute fracture or destructive bone lesion. Multilevel degenerative disc disease of the spine. Upper Abdomen: No acute abnormality in the partially visualized upper abdomen. Review of the MIP images confirms the above findings. IMPRESSION: 1. No acute intrathoracic abnormality; specifically, no pulmonary embolism, pneumonia, or pleural effusion. 2. Mild centrilobular emphysema. Aortic Atherosclerosis (ICD10-I70.0) and Emphysema (ICD10-J43.9). Electronically Signed  By: Rogelia Myers M.D.   On: 06/28/2023 14:00    Assessment & Plan:  Essential hypertension -     TSH; Future -     Basic metabolic panel with GFR; Future  Primary osteoarthritis of both hips -     Meloxicam ; Take 1 tablet (15 mg total) by mouth daily.  Dispense: 90 tablet; Refill: 0 -     oxyCODONE  HCl; Take 1 tablet (10 mg total) by mouth every 8 (eight) hours as needed.  Dispense: 90 tablet; Refill: 0  Psychophysiological insomnia -     QUEtiapine  Fumarate; Take 1 tablet (300 mg total) by mouth at bedtime.  Dispense: 90 tablet; Refill: 0  Hyperlipidemia with target LDL less than 130 -     Hepatic function panel; Future -     Lipid panel; Future  Obesity, morbid (HCC) -     TSH; Future -     Hemoglobin A1c; Future  Visit for screening mammogram -     Digital Screening Mammogram, Left and Right; Future  Acquired  hypothyroidism -     TSH; Future  Drug-induced hypokalemia -     Basic metabolic panel with GFR; Future -     Magnesium ; Future  Hx of adenomatous polyp of colon -     Ambulatory referral to Gastroenterology  Primary osteoarthritis of both knees -     oxyCODONE  HCl; Take 1 tablet (10 mg total) by mouth every 8 (eight) hours as needed.  Dispense: 90 tablet; Refill: 0     Follow-up: Return in about 3 months (around 11/22/2023).  Debby Molt, MD

## 2023-08-23 ENCOUNTER — Other Ambulatory Visit (HOSPITAL_COMMUNITY): Payer: Self-pay

## 2023-08-23 ENCOUNTER — Ambulatory Visit: Admitting: Physical Therapy

## 2023-08-23 ENCOUNTER — Ambulatory Visit: Payer: Self-pay | Admitting: Internal Medicine

## 2023-08-23 LAB — BASIC METABOLIC PANEL WITH GFR
BUN: 18 mg/dL (ref 6–23)
CO2: 23 meq/L (ref 19–32)
Calcium: 9.4 mg/dL (ref 8.4–10.5)
Chloride: 102 meq/L (ref 96–112)
Creatinine, Ser: 0.68 mg/dL (ref 0.40–1.20)
GFR: 96.13 mL/min (ref >60.00)
Glucose, Bld: 83 mg/dL (ref 70–99)
Potassium: 3.6 meq/L (ref 3.5–5.1)
Sodium: 137 meq/L (ref 135–145)

## 2023-08-23 MED ORDER — ROSUVASTATIN CALCIUM 20 MG PO TABS: 20.0000 mg | ORAL_TABLET | Freq: Every day | ORAL | 0 refills | Status: DC

## 2023-08-24 ENCOUNTER — Telehealth: Payer: Self-pay

## 2023-08-24 ENCOUNTER — Other Ambulatory Visit (HOSPITAL_COMMUNITY): Payer: Self-pay

## 2023-08-24 NOTE — Telephone Encounter (Signed)
 Pharmacy Patient Advocate Encounter   Received notification from CoverMyMeds that prior authorization for oxyCODONE  HCl 10MG  tablets is required/requested.   Insurance verification completed.   The patient is insured through [facility] .   Per test claim: PA required; PA submitted to above mentioned insurance via CoverMyMeds Key/confirmation #/EOC [id number] Status is pending

## 2023-08-24 NOTE — Telephone Encounter (Signed)
 Pharmacy Patient Advocate Encounter  Received notification from [facility] that Prior Authorization for   QUEtiapine  Fumarate 300MG  tablets has been APPROVED from 08/22/2023 to 08/21/2024. Unable to obtain price due to refill too soon rejection, last fill date 08/23/2023 next available fill date08/31/2025   PA #/Case ID/Reference #: [id number]

## 2023-08-24 NOTE — Telephone Encounter (Signed)
 Pharmacy Patient Advocate Encounter  Received notification from [facility] that Prior Authorization for Oxycodone  10mg  tabs has been APPROVED from 08/24/23 to 02/20/24   PA #/Case ID/Reference #: [id number]  Left a message at [facility] to notify of the approval

## 2023-08-25 ENCOUNTER — Other Ambulatory Visit (HOSPITAL_COMMUNITY): Payer: Self-pay

## 2023-08-26 NOTE — Telephone Encounter (Signed)
 Patient has been made aware and gave a verbal understanding.

## 2023-08-27 ENCOUNTER — Other Ambulatory Visit: Payer: Self-pay | Admitting: Internal Medicine

## 2023-08-27 DIAGNOSIS — E039 Hypothyroidism, unspecified: Secondary | ICD-10-CM

## 2023-08-29 ENCOUNTER — Ambulatory Visit

## 2023-08-29 DIAGNOSIS — M5459 Other low back pain: Secondary | ICD-10-CM

## 2023-08-29 DIAGNOSIS — M6281 Muscle weakness (generalized): Secondary | ICD-10-CM

## 2023-08-29 DIAGNOSIS — M25551 Pain in right hip: Secondary | ICD-10-CM

## 2023-08-29 NOTE — Therapy (Signed)
 OUTPATIENT PHYSICAL THERAPY TREATMENT NOTE   Patient Name: Denise Morgan MRN: [id number] DOB:14-Jun-1965, 58 y.o., female Today's Date: 08/29/2023  END OF SESSION:  PT End of Session - 08/29/23 0832     Visit Number 6    Number of Visits 12    Date for PT Re-Evaluation 10/01/23    Authorization Type [employer]    Authorization Time Period Approved 7 visits 08/08/23-10/06/23    Authorization - Visit Number 6    Authorization - Number of Visits 7    PT Start Time 0830    PT Stop Time 0910    PT Time Calculation (min) 40 min    Activity Tolerance Patient tolerated treatment well    Behavior During Therapy [facility] for tasks assessed/performed             Past Medical History:  Diagnosis Date   Allergy    Anemia    15+ years ago   Arthritis    Asthma    Blood transfusion without reported diagnosis    Depression    Dysrhythmia    Palpitations - Holter monitor in 2024   Family history of adverse reaction to anesthesia    sister had episode with anesthesia once, but no additional information given   Frequent headaches    Resolved as of 2024   History of phlebitis    Hx of adenomatous polyp of colon 08/15/2015   Hypertension    Hypothyroidism    Palpitations    heart monitor all normal per pt.2017   PE (pulmonary embolism)    Past Surgical History:  Procedure Laterality Date   ABDOMINAL HYSTERECTOMY  03/01/2004   CHOLECYSTECTOMY     COLONOSCOPY W/ POLYPECTOMY  07/2020   TOTAL HIP ARTHROPLASTY Right 02/21/2023   Procedure: RIGHT TOTAL HIP ARTHROPLASTY ANTERIOR APPROACH;  Surgeon: Jerri Kay HERO, MD;  Location: [facility] OR;  Service: Orthopedics;  Laterality: Right;  3-C   Patient Active Problem List   Diagnosis Date Noted   Drug-induced hypokalemia 08/22/2023   Primary osteoarthritis of both hips 08/22/2023   Need for vaccination 04/12/2023   Bradycardia 04/12/2023   Hyperplastic colonic polyp 04/06/2023   Psychophysiological insomnia 04/06/2023   Screening for  colon cancer 01/04/2023   Prolonged Q-T interval on ECG 01/04/2023   Seasonal allergic rhinitis due to fungal spores 04/05/2022   Moderate persistent asthma with exacerbation 04/05/2022   Long-term current use of opiate analgesic 02/16/2020   Acquired hypothyroidism 02/12/2020   Chronic hip pain, bilateral 05/22/2019   Essential hypertension 04/07/2016   Snoring 04/07/2016   Hx of adenomatous polyp of colon 08/15/2015   Asthma, mild intermittent 05/29/2015   Hyperlipidemia with target LDL less than 130 05/29/2015   Obesity, morbid (HCC) 02/16/2013   Tobacco abuse disorder 11/19/2012   Visit for screening mammogram 11/17/2012   DJD (degenerative joint disease) of knee 11/17/2012   Chronic pain syndrome 11/17/2012    PCP: Joshua Debby CROME, MD   REFERRING PROVIDER: Jerri Kay HERO, MD  REFERRING DIAG: [phone] (ICD-10-CM) - Bilateral hip pain Z96.641 (ICD-10-CM) - Status post total replacement of right hip M54.50,G89.29 (ICD-10-CM) - Chronic bilateral low back pain without sciatica  THERAPY DIAG:  Pain of both hip joints  Other low back pain  Muscle weakness (generalized)  Rationale for Evaluation and Treatment: Rehabilitation  ONSET DATE: chronic  SUBJECTIVE:   SUBJECTIVE STATEMENT: Doing well, saw MD and was issued anew medication for sleep which has helped.  Pain levels down to 4/10  PERTINENT HISTORY: Bilateral hip pain post-replacement Four months post-hip replacement with persistent bilateral hip pain. No infection or surgical complications. X-ray normal for replaced hip; contralateral hip arthritic. Discussed opioid risks and non-opioid pain management. - Order blood work to rule out rheumatoid arthritis or other conditions. - Prescribe prednisone  to reduce inflammation. - Advise on opioid risks and addiction potential.   Chronic pain due to arthritis Chronic pain from arthritis affecting multiple joints, consistent with polyarthralgia. - Prescribe  prednisone  to reduce inflammation. - Refer to physical therapy for bilateral hip pain.   Low back pain Chronic low back pain with radiating sharp pains. Suspected contribution to chronic pain syndrome. - Order x-ray of the back. - Refer to physical therapy for low back pain. - Consider MRI if no improvement after six weeks of physical therapy.  PAIN:  Are you having pain? Yes: NPRS scale: 10/10 Pain location: low back and hips Pain description: ache, sore Aggravating factors: activity Relieving factors: supine and heat  PRECAUTIONS: None  RED FLAGS: None   WEIGHT BEARING RESTRICTIONS: No  FALLS:  Has patient fallen in last 6 months? No  OCCUPATION: HH aide  PLOF: Independent  PATIENT GOALS: To manage my hip pain  NEXT MD VISIT: 6 months  OBJECTIVE:  Note: Objective measures were completed at Evaluation unless otherwise noted.  DIAGNOSTIC FINDINGS: X-rays of the pelvis and the right hip show stable right total hip  arthroplasty without any complications.  X-rays of the lumbar spine show diffuse degenerative changes.  Grade 1  spondylolisthesis of L4-5.  PATIENT SURVEYS:  Patient-specific activity scoring scheme (Point to one number):  0 represents "unable to perform." 10 represents "able to perform at prior level. 0 1 2 3 4 5 6 7 8 9  10 (Date and Score) Activity Initial  Activity Eval     walking  5    bending  4    sitting 4   standing 4     Total score = sum of the activity scores/number of activities Minimum detectable change (90%CI) for average score = 2 points Minimum detectable change (90%CI) for single activity score = 3 points PSFS developed by: Rosalee MYRTIS Marvis KYM Charlet CHRISTELLA., & Binkley, J. (1995). Assessing disability and change on individual  patients: a report of a patient specific measure. Physiotherapy [place], 47, [phone]. Reproduced with the permission of the authors  Score: 17/40 42% perceived function  MUSCLE  LENGTH: Hamstrings: Right 80 deg; Left 80 deg Thomas test: painful B  POSTURE: flexed trunk  and flexed hips  PALPATION: deferred  LUMBAR ROM:   Active  A/PROM  eval  Flexion 75%  Extension 75%  Right lateral flexion 50%  Left lateral flexion 50%  Right rotation   Left rotation    (Blank rows = not tested)   LOWER EXTREMITY ROM:  Passive ROM Right eval Left eval  Hip flexion 90d 90d  Hip extension    Hip abduction    Hip adduction    Hip internal rotation    Hip external rotation    Knee flexion    Knee extension    Ankle dorsiflexion    Ankle plantarflexion    Ankle inversion    Ankle eversion     (Blank rows = not tested)  LOWER EXTREMITY MMT:  MMT Right eval Left eval  Hip flexion    Hip extension    Hip abduction    Hip adduction    Hip internal rotation    Hip external rotation  Knee flexion    Knee extension    Ankle dorsiflexion    Ankle plantarflexion    Ankle inversion    Ankle eversion     (Blank rows = not tested)  LOWER EXTREMITY SPECIAL TESTS:  Hip special tests: Debby test: positive , Anterior hip impingement test: negative, and pain with L hip ROM  FUNCTIONAL TESTS:  30 seconds chair stand test  GAIT: Distance walked: 40ft x2 Assistive device utilized: None Level of assistance: Complete Independence Comments: antalgic gait pattern with decreased B hip ROM observed                                                                                                                                TREATMENT: [facility] Adult PT Treatment:                                                DATE: 08/29/23 Therapeutic Exercise: Nustep L4 8 min Neuromuscular re-ed: Supine hip fallouts GTB 15x B, 15/15 unilaterally Bridge against GTB 15x Bridge w/ball 15x P-ball curl ups 15x B, 15/15 unilateral Therapeutic Activity: S/L clams GTB 15/15 Seated hamstring stretch 30s x2 B Supine hip flexor stretch 30s x2 B  [facility] Adult PT Treatment:                                                 DATE: 08/22/23 Therapeutic Exercise: Nustep L2 8 min Neuromuscular re-ed: Supine hip fallouts RTB 15x B, 15/15 unilaterally P-ball curl ups 15x B, 15/15 unilateral Therapeutic Activity: S/L clams RTB 15/15 Seated hamstring stretch 30s x2 B Supine hip flexor stretch 30s x2 B  [facility] Adult PT Treatment:                                                DATE: 08/16/2023  Therapeutic Activity: NuStep 8' for activity tolerance Symptom provocation testing for further diagnostics Manual Trigger point release of quadriceps muscle group, TFL, distal iliopsoas Pt reported symptom reproduction with ease following completion of intervention    [facility] Adult PT Treatment:                                                DATE: 08/15/23 Therapeutic Exercise: Nustep L2 8 min Neuromuscular re-ed: Supine hip fallouts GTB 15x B, 15/15 unilaterally P-ball curl ups 15x B, 15/15 unilateral Bridge against GTB 10x Bridge with ball 10x Therapeutic Activity: S/L  clams GTB 15/15(deferred due to pain) Seated hamstring stretch 30s x2 B Supine hip flexor stretch 30s x2 B SKTC 30s x2 B   [facility] Adult PT Treatment:                                                DATE: 08/01/23 Eval and HEP Self Care: Additional minutes spent for educating on updated Therapeutic Home Exercise Program as well as comparing current status to condition at start of symptoms. This included exercises focusing on stretching, strengthening, with focus on eccentric aspects. Long term goals include an improvement in range of motion, strength, endurance as well as avoiding reinjury. Patient's frequency would include in 1-2 times a day, 3-5 times a week for a duration of 6-12 weeks. Proper technique shown and discussed handout in great detail. All questions were discussed and addressed.      PATIENT EDUCATION:  Education details: Discussed eval findings, rehab rationale and POC and patient is in agreement  Person  educated: Patient Education method: Explanation and Handouts Education comprehension: verbalized understanding and needs further education  HOME EXERCISE PROGRAM: Access Code: [email or web address] URL: [email or web address] Date: 08/29/2023 Prepared by: Reyes Kohut  Exercises - Sit to Stand with Arms Crossed  - 1-2 x daily - 3 x weekly - 1 sets - Modified Thomas Stretch  - 1-2 x daily - 3 x weekly - 1 sets - 2 reps - 30s hold - Hooklying Single Leg Bent Knee Fallouts with Resistance  - 1-2 x daily - 3 x weekly - 1 sets - 15 reps - Seated Table Hamstring Stretch  - 1-2 x daily - 3 x weekly - 1 sets - 2 reps - 30s hold - Clamshell with Resistance  - 1-2 x daily - 5 x weekly - 1 sets - 15 reps  ASSESSMENT:  CLINICAL IMPRESSION: Feels better, she notes more mobility.  Increased resistance on tasks today and reviewed HEP and updated as appropriate.  Patient has 1 more approved visit.  Assess progress towards goals at next session and consider discharge as appropriate.   Patient is a 58 y.o. female who was seen today for physical therapy evaluation and treatment for B hip and low back pain.  Scope of assessment limited by pain and inability to attain testing positions as well as multiple involve body parts.  L hip in need of THA and appears to be driving remainder of symptoms as patient has adopted a flexed hip posture.  B hip flexor tightness observed.  L hip very painful with testing positions/tasks.  Trunk ROM restricted as noted.  Patient reports high pain levels and rates her function at 42%.  OBJECTIVE IMPAIRMENTS: Abnormal gait, decreased activity tolerance, decreased knowledge of condition, decreased mobility, difficulty walking, decreased ROM, decreased strength, improper body mechanics, obesity, and pain.   ACTIVITY LIMITATIONS: carrying, lifting, bending, sitting, squatting, sleeping, and bed mobility  REHAB POTENTIAL: Good  CLINICAL DECISION MAKING:  Stable/uncomplicated  EVALUATION COMPLEXITY: Low   GOALS: Goals reviewed with patient? No  SHORT TERM GOALS: Target date: 08/22/2023  Patient to demonstrate independence in HEP  Baseline: [email or web address] Goal status: Met  2.  Assess 2 MWT and establish baseline Baseline: TBD Goal status: INITIAL    LONG TERM GOALS: Target date: 09/12/2023    Patient will increase 30s chair stand reps from 2 to 6 with/without arms to demonstrate and  improved functional ability with less pain/difficulty as well as reduce fall risk.  Baseline: 2 Goal status: INITIAL  2.  Patient will acknowledge 6/10 pain at least once during episode of care   Baseline: 10/10; 08/29/23 4/10 Goal status: Met  3.  Patient will score at least 50% on PSFS to signify clinically meaningful improvement in functional abilities.   Baseline: 42% Goal status: INITIAL  4.  Re-assess 2 MWT Baseline:  Goal status: INITIAL  5.  100d B hip flexion Baseline: 90d Goal status: INITIAL   PLAN:  PT FREQUENCY: 1-2x/week  PT DURATION: 6 weeks  PLANNED INTERVENTIONS: 97110-Therapeutic exercises, 97530- Therapeutic activity, 97112- Neuromuscular re-education, 97535- Self Care, [id number]- Manual therapy, Patient/Family education, Balance training, and Stair training  PLAN FOR NEXT SESSION: HEP review and update, manual techniques as appropriate, aerobic tasks, ROM and flexibility activities, strengthening and PREs, TPDN, gait and balance training as needed    For all possible CPT codes, reference the Planned Interventions line above.     Check all conditions that are expected to impact treatment: {Conditions expected to impact treatment:Complications related to surgery   If treatment provided at initial evaluation, no treatment charged due to lack of authorization.        Oreatha Fabry M Baylin Cabal, PT 08/29/2023, 9:11 AM

## 2023-08-30 ENCOUNTER — Encounter: Payer: Self-pay | Admitting: Physical Therapy

## 2023-08-30 ENCOUNTER — Ambulatory Visit: Attending: Orthopaedic Surgery | Admitting: Physical Therapy

## 2023-08-30 DIAGNOSIS — M25551 Pain in right hip: Secondary | ICD-10-CM | POA: Insufficient documentation

## 2023-08-30 DIAGNOSIS — M5459 Other low back pain: Secondary | ICD-10-CM | POA: Insufficient documentation

## 2023-08-30 DIAGNOSIS — M25552 Pain in left hip: Secondary | ICD-10-CM | POA: Diagnosis not present

## 2023-08-30 DIAGNOSIS — M6281 Muscle weakness (generalized): Secondary | ICD-10-CM | POA: Diagnosis not present

## 2023-08-30 NOTE — Therapy (Signed)
 OUTPATIENT PHYSICAL THERAPY TREATMENT NOTE   Patient Name: ANGLE DIRUSSO MRN: [id number] DOB:02/19/1966, 58 y.o., female Today's Date: 08/30/2023  END OF SESSION:  PT End of Session - 08/30/23 0900     Visit Number 7    Number of Visits 12    Date for PT Re-Evaluation 10/01/23    Authorization Type [facility]    Authorization Time Period Approved 7 visits 08/08/23-10/06/23    Authorization - Visit Number 7    Authorization - Number of Visits 7    PT Start Time 0830    PT Stop Time 0853    PT Time Calculation (min) 23 min    Activity Tolerance Patient tolerated treatment well    Behavior During Therapy [facility] for tasks assessed/performed              Past Medical History:  Diagnosis Date   Allergy    Anemia    15+ years ago   Arthritis    Asthma    Blood transfusion without reported diagnosis    Depression    Dysrhythmia    Palpitations - Holter monitor in 2024   Family history of adverse reaction to anesthesia    sister had episode with anesthesia once, but no additional information given   Frequent headaches    Resolved as of 2024   History of phlebitis    Hx of adenomatous polyp of colon 08/15/2015   Hypertension    Hypothyroidism    Palpitations    heart monitor all normal per pt.2017   PE (pulmonary embolism)    Past Surgical History:  Procedure Laterality Date   ABDOMINAL HYSTERECTOMY  03/01/2004   CHOLECYSTECTOMY     COLONOSCOPY W/ POLYPECTOMY  07/2020   TOTAL HIP ARTHROPLASTY Right 02/21/2023   Procedure: RIGHT TOTAL HIP ARTHROPLASTY ANTERIOR APPROACH;  Surgeon: Jerri Kay HERO, MD;  Location: [facility] OR;  Service: Orthopedics;  Laterality: Right;  3-C   Patient Active Problem List   Diagnosis Date Noted   Drug-induced hypokalemia 08/22/2023   Primary osteoarthritis of both hips 08/22/2023   Need for vaccination 04/12/2023   Bradycardia 04/12/2023   Hyperplastic colonic polyp 04/06/2023   Psychophysiological insomnia 04/06/2023   Screening for  colon cancer 01/04/2023   Prolonged Q-T interval on ECG 01/04/2023   Seasonal allergic rhinitis due to fungal spores 04/05/2022   Moderate persistent asthma with exacerbation 04/05/2022   Long-term current use of opiate analgesic 02/16/2020   Acquired hypothyroidism 02/12/2020   Chronic hip pain, bilateral 05/22/2019   Essential hypertension 04/07/2016   Snoring 04/07/2016   Hx of adenomatous polyp of colon 08/15/2015   Asthma, mild intermittent 05/29/2015   Hyperlipidemia with target LDL less than 130 05/29/2015   Obesity, morbid (HCC) 02/16/2013   Tobacco abuse disorder 11/19/2012   Visit for screening mammogram 11/17/2012   DJD (degenerative joint disease) of knee 11/17/2012   Chronic pain syndrome 11/17/2012    PCP: Joshua Debby CROME, MD   REFERRING PROVIDER: Jerri Kay HERO, MD  REFERRING DIAG: [phone] (ICD-10-CM) - Bilateral hip pain Z96.641 (ICD-10-CM) - Status post total replacement of right hip M54.50,G89.29 (ICD-10-CM) - Chronic bilateral low back pain without sciatica  THERAPY DIAG:  Pain of both hip joints  Muscle weakness (generalized)  Other low back pain  Rationale for Evaluation and Treatment: Rehabilitation  ONSET DATE: chronic  SUBJECTIVE:   SUBJECTIVE STATEMENT:  Pt attended today's session with reports of 0/10 pain. Pt stated that they have maintained great compliance with current HEP.  Feels that therapy has helped so far, can do some things that she couldn't before.    PERTINENT HISTORY: Bilateral hip pain post-replacement Four months post-hip replacement with persistent bilateral hip pain. No infection or surgical complications. X-ray normal for replaced hip; contralateral hip arthritic. Discussed opioid risks and non-opioid pain management. - Order blood work to rule out rheumatoid arthritis or other conditions. - Prescribe prednisone  to reduce inflammation. - Advise on opioid risks and addiction potential.   Chronic pain due to  arthritis Chronic pain from arthritis affecting multiple joints, consistent with polyarthralgia. - Prescribe prednisone  to reduce inflammation. - Refer to physical therapy for bilateral hip pain.   Low back pain Chronic low back pain with radiating sharp pains. Suspected contribution to chronic pain syndrome. - Order x-ray of the back. - Refer to physical therapy for low back pain. - Consider MRI if no improvement after six weeks of physical therapy.  PAIN:  Are you having pain? Yes: NPRS scale: 10/10 Pain location: low back and hips Pain description: ache, sore Aggravating factors: activity Relieving factors: supine and heat  PRECAUTIONS: None  RED FLAGS: None   WEIGHT BEARING RESTRICTIONS: No  FALLS:  Has patient fallen in last 6 months? No  OCCUPATION: HH aide  PLOF: Independent  PATIENT GOALS: To manage my hip pain  NEXT MD VISIT: 6 months  OBJECTIVE:  Note: Objective measures were completed at Evaluation unless otherwise noted.  DIAGNOSTIC FINDINGS: X-rays of the pelvis and the right hip show stable right total hip  arthroplasty without any complications.  X-rays of the lumbar spine show diffuse degenerative changes.  Grade 1  spondylolisthesis of L4-5.  PATIENT SURVEYS:  Patient-specific activity scoring scheme (Point to one number):  0 represents "unable to perform." 10 represents "able to perform at prior level. 0 1 2 3 4 5 6 7 8 9  10 (Date and Score) Activity Initial  Activity Eval  08/30/2023   walking  5 10   bending  4 8   sitting 4 10  standing 4 6    Total score = sum of the activity scores/number of activities Minimum detectable change (90%CI) for average score = 2 points Minimum detectable change (90%CI) for single activity score = 3 points PSFS developed by: Rosalee MYRTIS Marvis KYM Charlet CHRISTELLA., & Binkley, J. (1995). Assessing disability and change on individual  patients: a report of a patient specific measure. Physiotherapy [place],  47, [phone]. Reproduced with the permission of the authors  Score: 17/40 42% perceived function  MUSCLE LENGTH: Hamstrings: Right 80 deg; Left 80 deg Thomas test: painful B  POSTURE: flexed trunk  and flexed hips  PALPATION: deferred  LUMBAR ROM:   Active  A/PROM  eval  Flexion 75%  Extension 75%  Right lateral flexion 50%  Left lateral flexion 50%  Right rotation   Left rotation    (Blank rows = not tested)   LOWER EXTREMITY ROM:  Passive ROM Right eval Left eval  Hip flexion 90d 90d  Hip extension    Hip abduction    Hip adduction    Hip internal rotation    Hip external rotation    Knee flexion    Knee extension    Ankle dorsiflexion    Ankle plantarflexion    Ankle inversion    Ankle eversion     (Blank rows = not tested)  LOWER EXTREMITY MMT:  MMT Right eval Left eval  Hip flexion    Hip extension    Hip  abduction    Hip adduction    Hip internal rotation    Hip external rotation    Knee flexion    Knee extension    Ankle dorsiflexion    Ankle plantarflexion    Ankle inversion    Ankle eversion     (Blank rows = not tested)  LOWER EXTREMITY SPECIAL TESTS:  Hip special tests: Debby test: positive , Anterior hip impingement test: negative, and pain with L hip ROM  FUNCTIONAL TESTS:  30 seconds chair stand test: 11 GAIT: Distance walked: 32ft x2 Assistive device utilized: None Level of assistance: Complete Independence Comments: antalgic gait pattern with decreased B hip ROM observed                                                                                                                                TREATMENT: [facility] Adult PT Treatment:                                                DATE: 08/30/2023  Therapeutic Activity: Re-evaluative measures Self Care: POC discussion Pt education   [facility] Adult PT Treatment:                                                DATE: 08/29/23 Therapeutic Exercise: Nustep L4 8 min Neuromuscular  re-ed: Supine hip fallouts GTB 15x B, 15/15 unilaterally Bridge against GTB 15x Bridge w/ball 15x P-ball curl ups 15x B, 15/15 unilateral Therapeutic Activity: S/L clams GTB 15/15 Seated hamstring stretch 30s x2 B Supine hip flexor stretch 30s x2 B  [facility] Adult PT Treatment:                                                DATE: 08/22/23 Therapeutic Exercise: Nustep L2 8 min Neuromuscular re-ed: Supine hip fallouts RTB 15x B, 15/15 unilaterally P-ball curl ups 15x B, 15/15 unilateral Therapeutic Activity: S/L clams RTB 15/15 Seated hamstring stretch 30s x2 B Supine hip flexor stretch 30s x2 B   PATIENT EDUCATION:  Education details: Discussed eval findings, rehab rationale and POC and patient is in agreement  Person educated: Patient Education method: Explanation and Handouts Education comprehension: verbalized understanding and needs further education  HOME EXERCISE PROGRAM: Access Code: [email or web address] URL: [email or web address] Date: 08/29/2023 Prepared by: Reyes Kohut  Exercises - Sit to Stand with Arms Crossed  - 1-2 x daily - 3 x weekly - 1 sets - Modified Thomas Stretch  - 1-2 x daily - 3 x weekly - 1 sets - 2 reps -  30s hold - Hooklying Single Leg Bent Knee Fallouts with Resistance  - 1-2 x daily - 3 x weekly - 1 sets - 15 reps - Seated Table Hamstring Stretch  - 1-2 x daily - 3 x weekly - 1 sets - 2 reps - 30s hold - Clamshell with Resistance  - 1-2 x daily - 5 x weekly - 1 sets - 15 reps  ASSESSMENT:  CLINICAL IMPRESSION:  Pt attended physical therapy session for re-evaluation of B hip and low back pain. Pt attends with no reports of pain and has met all therapeutic goals. Pt is comfortable with d/c after meeting all rehab goals to continue with management using HEP. Pt required minimal v/t cuing as well as no assistance for safe and appropriate performance of today's activities. Pt is to be d/c at completion of today's session. Education was given to  continue applying ADL education from previous sessions as well as performing HEP as prescribed with freedom to progress as tolerated using previous education on modification and exercise dosage. Pt has displayed and verbalized competence regarding this education.    Patient is a 58 y.o. female who was seen today for physical therapy evaluation and treatment for B hip and low back pain.  Scope of assessment limited by pain and inability to attain testing positions as well as multiple involve body parts.  L hip in need of THA and appears to be driving remainder of symptoms as patient has adopted a flexed hip posture.  B hip flexor tightness observed.  L hip very painful with testing positions/tasks.  Trunk ROM restricted as noted.  Patient reports high pain levels and rates her function at 42%.  OBJECTIVE IMPAIRMENTS: Abnormal gait, decreased activity tolerance, decreased knowledge of condition, decreased mobility, difficulty walking, decreased ROM, decreased strength, improper body mechanics, obesity, and pain.   ACTIVITY LIMITATIONS: carrying, lifting, bending, sitting, squatting, sleeping, and bed mobility  REHAB POTENTIAL: Good  CLINICAL DECISION MAKING: Stable/uncomplicated  EVALUATION COMPLEXITY: Low   GOALS: Goals reviewed with patient? No  SHORT TERM GOALS: Target date: 08/22/2023  Patient to demonstrate independence in HEP  Baseline: [email or web address] Goal status: Met   LONG TERM GOALS: Target date: 09/12/2023    Patient will increase 30s chair stand reps from 2 to 6 with/without arms to demonstrate and improved functional ability with less pain/difficulty as well as reduce fall risk.  Baseline: 2 Goal status: met (11) 08/30/2023  2.  Patient will acknowledge 6/10 pain at least once during episode of care   Baseline: 10/10; 08/29/23 4/10 Goal status: Met  3.  Patient will score at least 50% on PSFS to signify clinically meaningful improvement in functional abilities.   Baseline:  42% Goal status: met (68%) 08/30/2023  5.  100d B hip flexion Baseline: 90d Goal status: MET 08/30/2023   PLAN:  PT FREQUENCY: 1-2x/week  PT DURATION: 6 weeks  PLANNED INTERVENTIONS: 97110-Therapeutic exercises, 97530- Therapeutic activity, 97112- Neuromuscular re-education, 97535- Self Care, 02859- Manual therapy, Patient/Family education, Balance training, and Stair training  PLAN FOR NEXT SESSION: HEP review and update, manual techniques as appropriate, aerobic tasks, ROM and flexibility activities, strengthening and PREs, TPDN, gait and balance training as needed    For all possible CPT codes, reference the Planned Interventions line above.     Check all conditions that are expected to impact treatment: {Conditions expected to impact treatment:Complications related to surgery   If treatment provided at initial evaluation, no treatment charged due to lack of authorization.  PHYSICAL THERAPY DISCHARGE SUMMARY  Visits from Start of Care: 7  Current functional level related to goals / functional outcomes: See assessment   Remaining deficits: See assessment   Education / Equipment: See assessment   Patient agrees to discharge. Patient goals were met. Patient is being discharged due to meeting the stated rehab goals.   Mabel Kiang, PT, DPT 08/30/2023, 9:01 AM

## 2023-08-31 ENCOUNTER — Telehealth: Payer: Self-pay | Admitting: *Deleted

## 2023-08-31 NOTE — Progress Notes (Signed)
 Care Guide Pharmacy Note  08/31/2023 Name: Denise Morgan MRN: [id number] DOB: Jul 16, 1965  Referred By: Joshua Debby CROME, MD Reason for referral: Call Attempt #1 and Complex Care Management (Outreach to schedule referral with pharmacist )   Denise Morgan is a 58 y.o. year old female who is a primary care patient of Joshua Debby CROME, MD.  Denise Morgan was referred to the pharmacist for assistance related to: HTN  An unsuccessful telephone outreach was attempted today to contact the patient who was referred to the pharmacy team for assistance with medication management. Additional attempts will be made to contact the patient.  Denise Morgan, CMA [place]  [facility], [facility] Guide Direct Dial: [phone]  Fax: [phone] Website: [email or web address]

## 2023-09-01 NOTE — Progress Notes (Signed)
 Care Guide Pharmacy Note  09/01/2023 Name: VANNIA POLA MRN: [id number] DOB: 1965-07-09  Referred By: Joshua Debby CROME, MD Reason for referral: Call Attempt #1 and Complex Care Management (Outreach to schedule referral with pharmacist )   KYANI SIMKIN is a 58 y.o. year old female who is a primary care patient of Joshua Debby CROME, MD.  Garen JONELLE Gearing was referred to the pharmacist for assistance related to: HTN  A second unsuccessful telephone outreach was attempted today to contact the patient who was referred to the pharmacy team for assistance with medication management. Additional attempts will be made to contact the patient.  Thedford Franks, CMA [place]  [facility], [facility] Guide Direct Dial: [phone]  Fax: [phone] Website: [email or web address]

## 2023-09-05 NOTE — Progress Notes (Signed)
 Care Guide Pharmacy Note  09/05/2023 Name: Denise Morgan MRN: [id number] DOB: 1966-02-26  Referred By: Joshua Debby CROME, MD Reason for referral: Call Attempt #1 and Complex Care Management (Outreach to schedule referral with pharmacist )   Denise Morgan is a 58 y.o. year old female who is a primary care patient of Joshua Debby CROME, MD.  Denise Morgan was referred to the pharmacist for assistance related to: HTN  A third unsuccessful telephone outreach was attempted today to contact the patient who was referred to the pharmacy team for assistance with medication management. The Population Health team is pleased to engage with this patient at any time in the future upon receipt of referral and should he/she be interested in assistance from the Population Health team.  Denise Morgan, CMA [facility]  [facility], [facility] Guide Direct Dial: [phone]  Fax: [phone] Website: [email or web address]

## 2023-09-06 ENCOUNTER — Other Ambulatory Visit (INDEPENDENT_AMBULATORY_CARE_PROVIDER_SITE_OTHER): Payer: Self-pay

## 2023-09-06 ENCOUNTER — Ambulatory Visit: Admitting: Orthopaedic Surgery

## 2023-09-06 VITALS — Ht 66.0 in | Wt 237.0 lb

## 2023-09-06 DIAGNOSIS — M1612 Unilateral primary osteoarthritis, left hip: Secondary | ICD-10-CM | POA: Diagnosis not present

## 2023-09-06 NOTE — Progress Notes (Signed)
 Office Visit Note   Patient: Denise Morgan           Date of Birth: Sep 11, 1965           MRN: [id number] Visit Date: 09/06/2023              Requested by: Joshua Debby CROME, MD [address] [place],  [place] [address] PCP: Joshua Debby CROME, MD   Assessment & Plan: Visit Diagnoses:  1. Primary osteoarthritis of left hip     Plan: History of Present Illness Denise Morgan is a 58 year old female who presents with left hip pain and leg length discrepancy.  She has completed physical therapy following her right hip replacement and now perceives her left leg to be longer than her right, leading to a sensation of walking unevenly. She is eager to proceed with a left hip replacement, as the left hip was more problematic prior to the right hip surgery.  She has a history of a blood clot and is not currently on anticoagulation therapy. She was recently prescribed medication for heart attack and stroke prevention due to elevated LDL levels. She anticipates resuming Xarelto  for the upcoming surgery.  Results RADIOLOGY Pelvis X-ray: Pelvic tilt causing functional leg length discrepancy; leg lengths are even (09/06/2023)  Assessment and Plan Impression is severe left hip degenerative joint disease secondary to Osteoarthritis.  Patient has attempted conservative treatment for at least 6 consecutive weeks within the past 12 weeks, including but not limited to physical therapy, home exercise program, NSAIDs, activity modification, and/or corticosteroid injections. Despite these efforts, symptoms have not improved or have worsened. Conservative measures have been deemed unsuccessful at this time. After a detailed discussion covering diagnosis and treatment options--including the risks, benefits, alternatives, and potential complications of surgical and nonsurgical management--the patient elected to proceed with surgery.  Current anticoagulants: No antithrombotic Postop anticoagulation:  Xarelto  Diabetic: No  Prior DVT/PE: Yes Tobacco use: Yes 0.75 ppd Clearances needed for surgery: None Anticipate discharge dispo: home   Functional leg length discrepancy Functional leg length discrepancy due to pelvic tilt from back issues, not a true leg length discrepancy. - Recommend physical therapy to address back alignment and correct pelvic tilt.  Blood clot History of blood clot, not currently on anticoagulation. - Prescribe Xarelto  post-surgery to prevent blood clots.  Follow-Up Instructions: No follow-ups on file.   Orders:  Orders Placed This Encounter  Procedures   XR Pelvis 1-2 Views   No orders of the defined types were placed in this encounter.     Procedures: No procedures performed   Clinical Data: No additional findings.   Subjective: Chief Complaint  Patient presents with   Left Hip - Follow-up    HPI  Review of Systems  Constitutional: Negative.   HENT: Negative.    Eyes: Negative.   Respiratory: Negative.    Cardiovascular: Negative.   Endocrine: Negative.   Musculoskeletal: Negative.   Neurological: Negative.   Hematological: Negative.   Psychiatric/Behavioral: Negative.    All other systems reviewed and are negative.    Objective: Vital Signs: Ht 5' 6 (1.676 m)   Wt 237 lb (107.5 kg)   LMP 03/01/2004   BMI 38.25 kg/m   Physical Exam Vitals and nursing note reviewed.  Constitutional:      Appearance: She is well-developed.  HENT:     Head: Atraumatic.     Nose: Nose normal.  Eyes:     Extraocular Movements: Extraocular movements intact.  Cardiovascular:  Pulses: Normal pulses.  Pulmonary:     Effort: Pulmonary effort is normal.  Abdominal:     Palpations: Abdomen is soft.  Musculoskeletal:     Cervical back: Neck supple.  Skin:    General: Skin is warm.     Capillary Refill: Capillary refill takes less than 2 seconds.  Neurological:     Mental Status: She is alert. Mental status is at baseline.   Psychiatric:        Behavior: Behavior normal.        Thought Content: Thought content normal.        Judgment: Judgment normal.     Ortho Exam  Specialty Comments:  No specialty comments available.  Imaging: XR Pelvis 1-2 Views Result Date: 09/06/2023 X-rays of the pelvis show a stable right total hip arthroplasty without complications.  Advanced degenerative changes of the left hip joint noted with bone-on-bone joint space narrowing.    PMFS History: Patient Active Problem List   Diagnosis Date Noted   Primary osteoarthritis of left hip 09/06/2023   Drug-induced hypokalemia 08/22/2023   Primary osteoarthritis of both hips 08/22/2023   Need for vaccination 04/12/2023   Bradycardia 04/12/2023   Hyperplastic colonic polyp 04/06/2023   Psychophysiological insomnia 04/06/2023   Screening for colon cancer 01/04/2023   Prolonged Q-T interval on ECG 01/04/2023   Seasonal allergic rhinitis due to fungal spores 04/05/2022   Moderate persistent asthma with exacerbation 04/05/2022   Long-term current use of opiate analgesic 02/16/2020   Acquired hypothyroidism 02/12/2020   Chronic hip pain, bilateral 05/22/2019   Essential hypertension 04/07/2016   Snoring 04/07/2016   Hx of adenomatous polyp of colon 08/15/2015   Asthma, mild intermittent 05/29/2015   Hyperlipidemia with target LDL less than 130 05/29/2015   Obesity, morbid (HCC) 02/16/2013   Tobacco abuse disorder 11/19/2012   Visit for screening mammogram 11/17/2012   DJD (degenerative joint disease) of knee 11/17/2012   Chronic pain syndrome 11/17/2012   Past Medical History:  Diagnosis Date   Allergy    Anemia    15+ years ago   Arthritis    Asthma    Blood transfusion without reported diagnosis    Depression    Dysrhythmia    Palpitations - Holter monitor in 2024   Family history of adverse reaction to anesthesia    sister had episode with anesthesia once, but no additional information given   Frequent  headaches    Resolved as of 2024   History of phlebitis    Hx of adenomatous polyp of colon 08/15/2015   Hypertension    Hypothyroidism    Palpitations    heart monitor all normal per pt.2017   PE (pulmonary embolism)     Family History  Problem Relation Age of Onset   Colon polyps Mother    Ovarian cancer Mother    Arthritis Mother    Colon cancer Maternal Aunt    Esophageal cancer Neg Hx    Rectal cancer Neg Hx    Stomach cancer Neg Hx     Past Surgical History:  Procedure Laterality Date   ABDOMINAL HYSTERECTOMY  03/01/2004   CHOLECYSTECTOMY     COLONOSCOPY W/ POLYPECTOMY  07/2020   TOTAL HIP ARTHROPLASTY Right 02/21/2023   Procedure: RIGHT TOTAL HIP ARTHROPLASTY ANTERIOR APPROACH;  Surgeon: Jerri Kay HERO, MD;  Location: [facility] OR;  Service: Orthopedics;  Laterality: Right;  3-C   Social History   Occupational History   Not on file  Tobacco Use  Smoking status: Every Day    Current packs/day: 0.75    Average packs/day: 0.8 packs/day for 33.0 years (24.8 ttl pk-yrs)    Types: Cigarettes   Smokeless tobacco: Never  Vaping Use   Vaping status: Never Used  Substance and Sexual Activity   Alcohol use: Not Currently    Comment: 2-3x/week - wine   Drug use: No   Sexual activity: Not Currently    Birth control/protection: Surgical

## 2023-09-20 ENCOUNTER — Encounter: Payer: Self-pay | Admitting: Internal Medicine

## 2023-09-23 ENCOUNTER — Other Ambulatory Visit: Payer: Self-pay | Admitting: Internal Medicine

## 2023-09-23 DIAGNOSIS — M160 Bilateral primary osteoarthritis of hip: Secondary | ICD-10-CM

## 2023-09-23 DIAGNOSIS — M170 Bilateral primary osteoarthritis of knee: Secondary | ICD-10-CM

## 2023-09-23 MED ORDER — OXYCODONE HCL 10 MG PO TABS: 10.0000 mg | ORAL_TABLET | Freq: Three times a day (TID) | ORAL | 0 refills | Status: DC | PRN

## 2023-09-23 NOTE — Telephone Encounter (Signed)
 Copied from CRM [id number]. Topic: Clinical - Medication Refill >> Sep 23, 2023 11:09 AM Rosina BIRCH wrote: Medication: Oxycodone  HCl 10 MG TABS  Has the patient contacted their pharmacy? Yes (Agent: If no, request that the patient contact the pharmacy for the refill. If patient does not wish to contact the pharmacy document the reason why and proceed with request.) (Agent: If yes, when and what did the pharmacy advise?)  This is the patient's preferred pharmacy:  [id number] [place], [place] - [address] [place] [address] Phone: [phone] Fax: [phone]  Is this the correct pharmacy for this prescription? Yes If no, delete pharmacy and type the correct one.   Has the prescription been filled recently? Yes  Is the patient out of the medication? Yes  Has the patient been seen for an appointment in the last year OR does the patient have an upcoming appointment? Yes  Can we respond through MyChart? Yes  Agent: Please be advised that Rx refills may take up to 3 business days. We ask that you follow-up with your pharmacy.

## 2023-09-30 ENCOUNTER — Other Ambulatory Visit: Payer: Self-pay | Admitting: Internal Medicine

## 2023-09-30 DIAGNOSIS — F5104 Psychophysiologic insomnia: Secondary | ICD-10-CM

## 2023-10-04 NOTE — Telephone Encounter (Signed)
 Last OV 08/22/23 Next OV not scheduled  Last refill 08/22/23 for Seroquel  300 mg - request is for 50 mg Qty #90/0  Forwarding to Dr. Joshua to review and advise.

## 2023-10-13 ENCOUNTER — Telehealth: Payer: Self-pay | Admitting: Physician Assistant

## 2023-10-13 NOTE — Telephone Encounter (Signed)
 Called pt and left 1X for pt to call and reschedule post op appt. Provider out of office

## 2023-10-16 ENCOUNTER — Other Ambulatory Visit: Payer: Self-pay | Admitting: Internal Medicine

## 2023-10-16 DIAGNOSIS — F5104 Psychophysiologic insomnia: Secondary | ICD-10-CM

## 2023-10-16 DIAGNOSIS — R9431 Abnormal electrocardiogram [ECG] [EKG]: Secondary | ICD-10-CM

## 2023-10-16 DIAGNOSIS — I10 Essential (primary) hypertension: Secondary | ICD-10-CM

## 2023-10-16 DIAGNOSIS — G894 Chronic pain syndrome: Secondary | ICD-10-CM

## 2023-10-21 ENCOUNTER — Other Ambulatory Visit (HOSPITAL_COMMUNITY): Payer: Self-pay

## 2023-10-21 ENCOUNTER — Telehealth: Payer: Self-pay

## 2023-10-21 NOTE — Telephone Encounter (Signed)
 Pharmacy Patient Advocate Encounter  Received notification from [facility] that Prior Authorization for Nadolol  20mg  tabs has been DENIED.  See denial reason below. No denial letter attached in CMM. Will attach denial letter to Media tab once received.   PA #/Case ID/Reference #: [id number]

## 2023-10-21 NOTE — Telephone Encounter (Signed)
 Pharmacy Patient Advocate Encounter   Received notification from Patient Pharmacy that prior authorization for Nadolol  20mg  tabs is required/requested.   Insurance verification completed.   The patient is insured through [facility] .   Per test claim: PA required; PA submitted to above mentioned insurance via Latent Key/confirmation #/EOC [id number] Status is pending

## 2023-10-24 ENCOUNTER — Ambulatory Visit
Admission: RE | Admit: 2023-10-24 | Discharge: 2023-10-24 | Disposition: A | Discharge status: Home / Self Care | Source: Ambulatory Visit | Attending: Internal Medicine | Admitting: Internal Medicine

## 2023-10-24 ENCOUNTER — Ambulatory Visit (AMBULATORY_SURGERY_CENTER)

## 2023-10-24 VITALS — Ht 66.0 in | Wt 240.0 lb

## 2023-10-24 DIAGNOSIS — Z1231 Encounter for screening mammogram for malignant neoplasm of breast: Secondary | ICD-10-CM | POA: Diagnosis not present

## 2023-10-24 DIAGNOSIS — Z860100 Personal history of colon polyps, unspecified: Secondary | ICD-10-CM

## 2023-10-24 MED ORDER — PLENVU 140 G PO SOLR: 1.0000 | Freq: Once | ORAL | 0 refills | Status: AC

## 2023-10-24 NOTE — Progress Notes (Signed)
 No egg or soy allergy known to patient  No issues known to pt with past sedation with any surgeries or procedures Patient denies ever being told they had issues or difficulty with intubation  No FH of Malignant Hyperthermia Pt is not on diet pills Pt is not on  home 02  Pt is not on blood thinners  Pt denies issues with constipation  No A fib or A flutter Have any cardiac testing pending-- no  LOA: independent  Prep: plenvu    Patient's chart reviewed by Norleen Schillings CNRA prior to previsit and patient appropriate for the [facility].  Previsit completed and red dot placed by patient's name on their procedure day (on provider's schedule).     PV completed with patient. Prep instructions sent via mychart and home address.

## 2023-10-26 ENCOUNTER — Other Ambulatory Visit: Payer: Self-pay | Admitting: Internal Medicine

## 2023-10-26 DIAGNOSIS — I10 Essential (primary) hypertension: Secondary | ICD-10-CM

## 2023-10-26 DIAGNOSIS — R9431 Abnormal electrocardiogram [ECG] [EKG]: Secondary | ICD-10-CM

## 2023-10-26 MED ORDER — NEBIVOLOL HCL 5 MG PO TABS: 5.0000 mg | ORAL_TABLET | Freq: Every day | ORAL | 0 refills | Status: DC

## 2023-10-26 NOTE — Telephone Encounter (Signed)
 Dr Joshua this patients insurance isn't going to cover the Nadolol  can we please try either the Atenolol, Carvediol, or nebivolol ?

## 2023-11-03 ENCOUNTER — Telehealth: Payer: Self-pay | Admitting: Internal Medicine

## 2023-11-03 ENCOUNTER — Other Ambulatory Visit: Payer: Self-pay | Admitting: Family

## 2023-11-03 DIAGNOSIS — M170 Bilateral primary osteoarthritis of knee: Secondary | ICD-10-CM

## 2023-11-03 DIAGNOSIS — M160 Bilateral primary osteoarthritis of hip: Secondary | ICD-10-CM

## 2023-11-03 MED ORDER — OXYCODONE HCL 10 MG PO TABS: 10.0000 mg | ORAL_TABLET | Freq: Three times a day (TID) | ORAL | 0 refills | Status: DC | PRN

## 2023-11-03 NOTE — Telephone Encounter (Unsigned)
 Copied from CRM [id number]. Topic: Clinical - Medication Refill >> Nov 03, 2023  9:36 AM Jayma L wrote: Medication: Oxycodone  HCl 10 MG TABS  Has the patient contacted their pharmacy? No (Agent: If no, request that the patient contact the pharmacy for the refill. If patient does not wish to contact the pharmacy document the reason why and proceed with request.) (Agent: If yes, when and what did the pharmacy advise?)  This is the patient's preferred pharmacy:  [id number] [place], [place] - [address] [place] [address] Phone: [phone] Fax: [phone]   Is this the correct pharmacy for this prescription? Yes If no, delete pharmacy and type the correct one.   Has the prescription been filled recently? No  Is the patient out of the medication? No  Has the patient been seen for an appointment in the last year OR does the patient have an upcoming appointment? Yes  Can we respond through MyChart? Yes  Agent: Please be advised that Rx refills may take up to 3 business days. We ask that you follow-up with your pharmacy.

## 2023-11-04 ENCOUNTER — Other Ambulatory Visit: Payer: Self-pay | Admitting: Internal Medicine

## 2023-11-04 DIAGNOSIS — I10 Essential (primary) hypertension: Secondary | ICD-10-CM

## 2023-11-06 NOTE — Progress Notes (Unsigned)
 [facility] History and Physical   Primary Care Physician:  Joshua Debby CROME, MD   Reason for Procedure:  History of colon polyp  Plan:    Colonoscopy     HPI: Denise Morgan is a 58 y.o. female status post removal of a 5 mm adenoma but otherwise normal colonoscopy in 2017.  She is here for surveillance examination.   Past Medical History:  Diagnosis Date   Allergy    Anemia    15+ years ago   Arthritis    Asthma    Blood transfusion without reported diagnosis    Depression    Dysrhythmia    Palpitations - Holter monitor in 2024   Family history of adverse reaction to anesthesia    sister had episode with anesthesia once, but no additional information given   Frequent headaches    Resolved as of 2024   History of phlebitis    Hx of adenomatous polyp of colon 08/15/2015   Hypertension    Hypothyroidism    Palpitations    heart monitor all normal per pt.2017   PE (pulmonary embolism)     Past Surgical History:  Procedure Laterality Date   ABDOMINAL HYSTERECTOMY  03/01/2004   CHOLECYSTECTOMY     COLONOSCOPY W/ POLYPECTOMY  07/2020   TOTAL HIP ARTHROPLASTY Right 02/21/2023   Procedure: RIGHT TOTAL HIP ARTHROPLASTY ANTERIOR APPROACH;  Surgeon: Jerri Kay HERO, MD;  Location: [facility] OR;  Service: Orthopedics;  Laterality: Right;  3-C     Current Outpatient Medications  Medication Sig Dispense Refill   indapamide  (LOZOL ) 2.5 MG tablet TAKE 1 TABLET BY MOUTH EVERY DAY 90 tablet 0   levothyroxine  (SYNTHROID ) 50 MCG tablet TAKE 1 TABLET BY MOUTH EVERY DAY BEFORE BREAKFAST 90 tablet 1   meloxicam  (MOBIC ) 15 MG tablet Take 1 tablet (15 mg total) by mouth daily. 90 tablet 0   nebivolol  (BYSTOLIC ) 5 MG tablet Take 1 tablet (5 mg total) by mouth daily. 90 tablet 0   Oxycodone  HCl 10 MG TABS Take 1 tablet (10 mg total) by mouth every 8 (eight) hours as needed. 90 tablet 0   potassium chloride  (KLOR-CON ) 8 MEQ tablet TAKE 1 TABLET (8 MEQ TOTAL) BY MOUTH 3 (THREE)  TIMES DAILY. 270 tablet 0   QUEtiapine  (SEROQUEL ) 300 MG tablet Take 1 tablet (300 mg total) by mouth at bedtime. 90 tablet 0   rosuvastatin  (CRESTOR ) 20 MG tablet Take 1 tablet (20 mg total) by mouth daily. 90 tablet 0   acetaminophen  (TYLENOL ) 650 MG CR tablet Take 650 mg by mouth every 8 (eight) hours as needed for pain. (Patient not taking: Reported on 11/07/2023)     Albuterol -Budesonide  (AIRSUPRA ) 90-80 MCG/ACT AERO Inhale 2 puffs into the lungs 3 (three) times daily as needed (shortness of breath).     amoxicillin  (AMOXIL ) 500 MG capsule Take 4 pills one hour prior to dental work (Patient not taking: Reported on 07/27/2023) 8 capsule 2   docusate sodium  (COLACE) 100 MG capsule Take 1 capsule (100 mg total) by mouth daily as needed. (Patient not taking: No sig reported) 30 capsule 2   fluticasone  (FLONASE ) 50 MCG/ACT nasal spray Place 2 sprays into both nostrils daily. (Patient not taking: No sig reported) 48 g 1   meclizine  (ANTIVERT ) 25 MG tablet Take 1 tablet (25 mg total) by mouth 3 (three) times daily as needed for dizziness. (Patient not taking: No sig reported) 20 tablet 0   naloxone  (NARCAN ) nasal spray 4 mg/0.1 mL Place  1 spray into the nose as needed.     traZODone  (DESYREL ) 100 MG tablet TAKE 2 TABLETS BY MOUTH AT BEDTIME. (Patient not taking: No sig reported) 180 tablet 0   Current Facility-Administered Medications  Medication Dose Route Frequency Provider Last Rate Last Admin   0.9 %  sodium chloride  infusion  500 mL Intravenous Continuous Avram Lupita BRAVO, MD        Allergies as of 11/07/2023 - Review Complete 11/07/2023  Allergen Reaction Noted   Latex Itching and Rash 11/17/2012    Family History  Problem Relation Age of Onset   Colon polyps Mother    Ovarian cancer Mother    Arthritis Mother    Colon cancer Maternal Aunt    Esophageal cancer Neg Hx    Rectal cancer Neg Hx    Stomach cancer Neg Hx     Social History   Socioeconomic History   Marital status:  Single    Spouse name: Not on file   Number of children: Not on file   Years of education: Not on file   Highest education level: Not on file  Occupational History   Not on file  Tobacco Use   Smoking status: Every Day    Current packs/day: 0.75    Average packs/day: 0.8 packs/day for 33.0 years (24.8 ttl pk-yrs)    Types: Cigarettes   Smokeless tobacco: Never  Vaping Use   Vaping status: Never Used  Substance and Sexual Activity   Alcohol use: Not Currently    Comment: 2-3x/week - wine   Drug use: No   Sexual activity: Not Currently    Birth control/protection: Surgical  Other Topics Concern   Not on file  Social History Narrative   Not on file   Social Drivers of Health   Financial Resource Strain: Not on file  Food Insecurity: Not on file  Transportation Needs: Not on file  Physical Activity: Not on file  Stress: Not on file  Social Connections: Not on file  Intimate Partner Violence: Not on file    Review of Systems:  All other review of systems negative except as mentioned in the HPI.  Physical Exam: Vital signs BP (!) 142/82   Pulse 63   Temp (!) 97.3 F (36.3 C)   Ht 5' 6 (1.676 m)   Wt 240 lb (108.9 kg)   LMP 03/01/2004   SpO2 99%   BMI 38.74 kg/m   General:   Alert,  Well-developed, well-nourished, pleasant and cooperative in NAD Lungs:  Clear throughout to auscultation.   Heart:  Regular rate and rhythm; no murmurs, clicks, rubs,  or gallops. Abdomen:  Soft, nontender and nondistended. Normal bowel sounds.   Neuro/Psych:  Alert and cooperative. Normal mood and affect. A and O x 3   @[name]  [name] Avram, MD, [facility] [phone] (pager) 11/07/2023 8:38 AM@

## 2023-11-07 ENCOUNTER — Ambulatory Visit: Admitting: Internal Medicine

## 2023-11-07 ENCOUNTER — Encounter: Payer: Self-pay | Admitting: Internal Medicine

## 2023-11-07 VITALS — BP 154/86 | HR 56 | Temp 97.3°F | Resp 13 | Ht 66.0 in | Wt 240.0 lb

## 2023-11-07 DIAGNOSIS — I10 Essential (primary) hypertension: Secondary | ICD-10-CM | POA: Diagnosis not present

## 2023-11-07 DIAGNOSIS — K648 Other hemorrhoids: Secondary | ICD-10-CM | POA: Diagnosis not present

## 2023-11-07 DIAGNOSIS — Z860101 Personal history of adenomatous and serrated colon polyps: Secondary | ICD-10-CM

## 2023-11-07 DIAGNOSIS — F32A Depression, unspecified: Secondary | ICD-10-CM | POA: Diagnosis not present

## 2023-11-07 DIAGNOSIS — J45909 Unspecified asthma, uncomplicated: Secondary | ICD-10-CM | POA: Diagnosis not present

## 2023-11-07 DIAGNOSIS — Z860100 Personal history of colon polyps, unspecified: Secondary | ICD-10-CM

## 2023-11-07 DIAGNOSIS — Z1211 Encounter for screening for malignant neoplasm of colon: Secondary | ICD-10-CM | POA: Diagnosis not present

## 2023-11-07 DIAGNOSIS — E039 Hypothyroidism, unspecified: Secondary | ICD-10-CM | POA: Diagnosis not present

## 2023-11-07 DIAGNOSIS — D125 Benign neoplasm of sigmoid colon: Secondary | ICD-10-CM | POA: Diagnosis not present

## 2023-11-07 MED ORDER — SODIUM CHLORIDE 0.9 % IV SOLN: 500.0000 mL | INTRAVENOUS | Status: DC

## 2023-11-07 NOTE — Op Note (Signed)
 [facility] Patient Name: Denise Morgan Procedure Date: 11/07/2023 8:25 AM MRN: [id number] Endoscopist: Lupita FORBES Commander , MD, [id number] Age: 58 Referring MD:  Date of Birth: [birth year] Gender: Female Account #: [id number] Procedure:                Colonoscopy Indications:              Surveillance: Personal history of adenomatous                            polyps on last colonoscopy > 5 years ago, Last                            colonoscopy: 2017 Medicines:                Monitored Anesthesia Care Procedure:                Pre-Anesthesia Assessment:                           - Prior to the procedure, a History and Physical                            was performed, and patient medications and                            allergies were reviewed. The patient's tolerance of                            previous anesthesia was also reviewed. The risks                            and benefits of the procedure and the sedation                            options and risks were discussed with the patient.                            All questions were answered, and informed consent                            was obtained. Prior Anticoagulants: The patient has                            taken no anticoagulant or antiplatelet agents. ASA                            Grade Assessment: III - A patient with severe                            systemic disease. After reviewing the risks and                            benefits, the patient was deemed in satisfactory  condition to undergo the procedure.                           After obtaining informed consent, the colonoscope                            was passed under direct vision. Throughout the                            procedure, the patient's blood pressure, pulse, and                            oxygen saturations were monitored continuously. The                            Olympus Scope SN [id number] was introduced  through the                            anus and advanced to the the cecum, identified by                            appendiceal orifice and ileocecal valve. The                            colonoscopy was performed without difficulty. The                            patient tolerated the procedure well. The quality                            of the bowel preparation was good. The ileocecal                            valve, appendiceal orifice, and rectum were                            photographed. The bowel preparation used was Plenvu                             via split dose instruction. Scope In: 8:47:25 AM Scope Out: 9:00:02 AM Scope Withdrawal Time: 0 hours 8 minutes 55 seconds  Total Procedure Duration: 0 hours 12 minutes 37 seconds  Findings:                 The perianal and digital rectal examinations were                            normal.                           A 5 mm polyp was found in the sigmoid colon. The                            polyp was sessile. The polyp was removed with a  cold snare. Resection and retrieval were complete.                            Verification of patient identification for the                            specimen was done. Estimated blood loss was minimal.                           Internal hemorrhoids were found.                           The exam was otherwise without abnormality on                            direct and retroflexion views. Complications:            No immediate complications. Estimated Blood Loss:     Estimated blood loss was minimal. Impression:               - One 5 mm polyp in the sigmoid colon, removed with                            a cold snare. Resected and retrieved.                           - Internal hemorrhoids.                           - The examination was otherwise normal on direct                            and retroflexion views.                           - Personal history of colonic  polyp 5 mm adenoma in                            2017. Recommendation:           - Patient has a contact number available for                            emergencies. The signs and symptoms of potential                            delayed complications were discussed with the                            patient. Return to normal activities tomorrow.                            Written discharge instructions were provided to the                            patient.                           -  Resume previous diet.                           - Continue present medications.                           - Repeat colonoscopy is recommended for                            surveillance. The colonoscopy date will be                            determined after pathology results from today's                            exam become available for review. Lupita FORBES Commander, MD 11/07/2023 9:08:51 AM This report has been signed electronically.

## 2023-11-07 NOTE — Progress Notes (Signed)
 Pt A/O x 3, gd SR's, pleased with anesthesia, report to RN

## 2023-11-07 NOTE — Patient Instructions (Addendum)
 I found and removed one tiny polyp again.  I will let you know pathology results and when to have another routine colonoscopy by mail and/or My Chart.  Also have internal hemorrhoids.  I appreciate the opportunity to care for you. Lupita [name] Commander, MD, FACG  YOU HAD AN ENDOSCOPIC PROCEDURE TODAY AT THE [facility]:   Refer to the procedure report that was given to you for any specific questions about what was found during the examination.  If the procedure report does not answer your questions, please call your gastroenterologist to clarify.  If you requested that your care partner not be given the details of your procedure findings, then the procedure report has been included in a sealed envelope for you to review at your convenience later.  YOU SHOULD EXPECT: Some feelings of bloating in the abdomen. Passage of more gas than usual.  Walking can help get rid of the air that was put into your GI tract during the procedure and reduce the bloating. If you had a lower endoscopy (such as a colonoscopy or flexible sigmoidoscopy) you may notice spotting of blood in your stool or on the toilet paper. If you underwent a bowel prep for your procedure, you may not have a normal bowel movement for a few days.  Please Note:  You might notice some irritation and congestion in your nose or some drainage.  This is from the oxygen used during your procedure.  There is no need for concern and it should clear up in a day or so.  SYMPTOMS TO REPORT IMMEDIATELY:  Following lower endoscopy (colonoscopy or flexible sigmoidoscopy):  Excessive amounts of blood in the stool  Significant tenderness or worsening of abdominal pains  Swelling of the abdomen that is new, acute  Fever of 100F or higher  For urgent or emergent issues, a gastroenterologist can be reached at any hour by calling [phone]. Do not use MyChart messaging for urgent concerns.    DIET:  We do recommend a small meal at first,  but then you may proceed to your regular diet.  Drink plenty of fluids but you should avoid alcoholic beverages for 24 hours.  ACTIVITY:  You should plan to take it easy for the rest of today and you should NOT DRIVE or use heavy machinery until tomorrow (because of the sedation medicines used during the test).    FOLLOW UP: Our staff will call the number listed on your records the next business day following your procedure.  We will call around 7:15- 8:00 am to check on you and address any questions or concerns that you may have regarding the information given to you following your procedure. If we do not reach you, we will leave a message.     If any biopsies were taken you will be contacted by phone or by letter within the next 1-3 weeks.  Please call us  at [phone] if you have not heard about the biopsies in 3 weeks.    SIGNATURES/CONFIDENTIALITY: You and/or your care partner have signed paperwork which will be entered into your electronic medical record.  These signatures attest to the fact that that the information above on your After Visit Summary has been reviewed and is understood.  Full responsibility of the confidentiality of this discharge information lies with you and/or your care-partner.

## 2023-11-07 NOTE — Progress Notes (Signed)
 Called to room to assist during endoscopic procedure.  Patient ID and intended procedure confirmed with present staff. Received instructions for my participation in the procedure from the performing physician.

## 2023-11-07 NOTE — Progress Notes (Signed)
 Pt's states no medical or surgical changes since previsit or office visit.

## 2023-11-08 ENCOUNTER — Telehealth: Payer: Self-pay | Admitting: *Deleted

## 2023-11-08 NOTE — Telephone Encounter (Signed)
 No answer on follow up call. Left message.

## 2023-11-10 LAB — SURGICAL PATHOLOGY

## 2023-11-11 ENCOUNTER — Ambulatory Visit: Payer: Self-pay | Admitting: Internal Medicine

## 2023-11-11 DIAGNOSIS — Z860101 Personal history of adenomatous and serrated colon polyps: Secondary | ICD-10-CM

## 2023-11-22 ENCOUNTER — Other Ambulatory Visit: Payer: Self-pay | Admitting: Physician Assistant

## 2023-11-22 MED ORDER — OXYCODONE-ACETAMINOPHEN 5-325 MG PO TABS: 1.0000 | ORAL_TABLET | Freq: Four times a day (QID) | ORAL | 0 refills | Status: DC | PRN

## 2023-11-22 MED ORDER — METHOCARBAMOL 750 MG PO TABS: 750.0000 mg | ORAL_TABLET | Freq: Three times a day (TID) | ORAL | 2 refills | Status: AC | PRN

## 2023-11-22 MED ORDER — DOCUSATE SODIUM 100 MG PO CAPS: 100.0000 mg | ORAL_CAPSULE | Freq: Every day | ORAL | 2 refills | Status: DC | PRN

## 2023-11-23 ENCOUNTER — Ambulatory Visit: Admitting: Internal Medicine

## 2023-11-23 ENCOUNTER — Encounter: Payer: Self-pay | Admitting: Internal Medicine

## 2023-11-23 VITALS — BP 122/86 | HR 62 | Temp 98.1°F | Resp 16 | Ht 66.0 in | Wt 243.4 lb

## 2023-11-23 DIAGNOSIS — I10 Essential (primary) hypertension: Secondary | ICD-10-CM

## 2023-11-23 DIAGNOSIS — I8290 Acute embolism and thrombosis of unspecified vein: Secondary | ICD-10-CM | POA: Insufficient documentation

## 2023-11-23 DIAGNOSIS — E785 Hyperlipidemia, unspecified: Secondary | ICD-10-CM

## 2023-11-23 DIAGNOSIS — E039 Hypothyroidism, unspecified: Secondary | ICD-10-CM

## 2023-11-23 DIAGNOSIS — D68311 Acquired hemophilia: Secondary | ICD-10-CM | POA: Insufficient documentation

## 2023-11-23 DIAGNOSIS — M1612 Unilateral primary osteoarthritis, left hip: Secondary | ICD-10-CM

## 2023-11-23 DIAGNOSIS — D729 Disorder of white blood cells, unspecified: Secondary | ICD-10-CM | POA: Insufficient documentation

## 2023-11-23 DIAGNOSIS — I2699 Other pulmonary embolism without acute cor pulmonale: Secondary | ICD-10-CM | POA: Insufficient documentation

## 2023-11-23 DIAGNOSIS — M160 Bilateral primary osteoarthritis of hip: Secondary | ICD-10-CM | POA: Diagnosis not present

## 2023-11-23 DIAGNOSIS — I881 Chronic lymphadenitis, except mesenteric: Secondary | ICD-10-CM | POA: Insufficient documentation

## 2023-11-23 LAB — LIPID PANEL
Cholesterol: 204 mg/dL — ABNORMAL HIGH (ref 0–200)
HDL: 73.8 mg/dL (ref >39.00)
LDL Cholesterol: 108 mg/dL — ABNORMAL HIGH (ref 0–99)
NonHDL: 129.76
Total CHOL/HDL Ratio: 3
Triglycerides: 108 mg/dL (ref 0.0–149.0)
VLDL: 21.6 mg/dL (ref 0.0–40.0)

## 2023-11-23 LAB — CBC WITH DIFFERENTIAL/PLATELET
Basophils Absolute: 0 K/uL (ref 0.0–0.1)
Basophils Relative: 0.6 % (ref 0.0–3.0)
Eosinophils Absolute: 0.2 K/uL (ref 0.0–0.7)
Eosinophils Relative: 2.5 % (ref 0.0–5.0)
HCT: 41.2 % (ref 36.0–46.0)
Hemoglobin: 13.7 g/dL (ref 12.0–15.0)
Lymphocytes Relative: 40.9 % (ref 12.0–46.0)
Lymphs Abs: 2.6 K/uL (ref 0.7–4.0)
MCHC: 33.3 g/dL (ref 30.0–36.0)
MCV: 101.4 fl — ABNORMAL HIGH (ref 78.0–100.0)
Monocytes Absolute: 0.4 K/uL (ref 0.1–1.0)
Monocytes Relative: 5.9 % (ref 3.0–12.0)
Neutro Abs: 3.1 K/uL (ref 1.4–7.7)
Neutrophils Relative %: 50.1 % (ref 43.0–77.0)
Platelets: 224 K/uL (ref 150.0–400.0)
RBC: 4.06 Mil/uL (ref 3.87–5.11)
RDW: 13.8 % (ref 11.5–15.5)
WBC: 6.2 K/uL (ref 4.0–10.5)

## 2023-11-23 LAB — TSH: TSH: 1.72 u[IU]/mL (ref 0.35–5.50)

## 2023-11-23 LAB — BASIC METABOLIC PANEL WITH GFR
BUN: 23 mg/dL (ref 6–23)
CO2: 25 meq/L (ref 19–32)
Calcium: 10 mg/dL (ref 8.4–10.5)
Chloride: 105 meq/L (ref 96–112)
Creatinine, Ser: 0.74 mg/dL (ref 0.40–1.20)
GFR: 89.14 mL/min (ref >60.00)
Glucose, Bld: 94 mg/dL (ref 70–99)
Potassium: 3.8 meq/L (ref 3.5–5.1)
Sodium: 138 meq/L (ref 135–145)

## 2023-11-23 NOTE — Progress Notes (Unsigned)
 Subjective:  Patient ID: CORNELIA Morgan, female    DOB: 1965/12/22  Age: 58 y.o. MRN: [id number]  CC: Osteoarthritis, Hypertension, and Hypothyroidism   HPI Denise Morgan presents for f/up ----   Discussed the use of AI scribe software for clinical note transcription with the patient, who gave verbal consent to proceed.  History of Present Illness Denise Morgan is a 58 year old female who presents with dizziness and low blood pressure.  She experiences dizziness, particularly when lying on her left side, but describes it as not severe. No associated nausea, vomiting, diarrhea, or weight loss.  She is preparing for surgery on her other hip and needs her health to be stable for the procedure. She takes Percocet for pain and meloxicam  at night. She also takes Robaxin , typically used post-surgery. She does not use OxyContin .  She recalls being prescribed a statin for high cholesterol, with her LDL cholesterol previously recorded at 198. She attributes her high cholesterol to dietary habits, specifically mentioning cheese consumption. She is concerned about whether her cholesterol levels will affect her upcoming surgery.  She has had a mammogram and colonoscopy and plans to delay her flu shot until after her surgery. She inquires about the necessity of an RSV vaccine. No current issues with her stomach or bowel movements. She notes that her legs were swollen a couple of days ago after attending a funeral.     Outpatient Medications Prior to Visit  Medication Sig Dispense Refill   Albuterol -Budesonide  (AIRSUPRA ) 90-80 MCG/ACT AERO Inhale 2 puffs into the lungs 3 (three) times daily as needed (shortness of breath).     levothyroxine  (SYNTHROID ) 50 MCG tablet TAKE 1 TABLET BY MOUTH EVERY DAY BEFORE BREAKFAST 90 tablet 1   methocarbamol  (ROBAXIN ) 750 MG tablet Take 1 tablet (750 mg total) by mouth 3 (three) times daily as needed. 30 tablet 2   naloxone  (NARCAN ) nasal spray 4 mg/0.1  mL Place 1 spray into the nose as needed.     nebivolol  (BYSTOLIC ) 5 MG tablet Take 1 tablet (5 mg total) by mouth daily. 90 tablet 0   Oxycodone  HCl 10 MG TABS Take 1 tablet (10 mg total) by mouth every 8 (eight) hours as needed. 90 tablet 0   potassium chloride  (KLOR-CON ) 8 MEQ tablet TAKE 1 TABLET (8 MEQ TOTAL) BY MOUTH 3 (THREE) TIMES DAILY. 270 tablet 0   QUEtiapine  (SEROQUEL ) 300 MG tablet Take 1 tablet (300 mg total) by mouth at bedtime. 90 tablet 0   indapamide  (LOZOL ) 2.5 MG tablet TAKE 1 TABLET BY MOUTH EVERY DAY 90 tablet 0   meloxicam  (MOBIC ) 15 MG tablet Take 1 tablet (15 mg total) by mouth daily. 90 tablet 0   oxyCODONE -acetaminophen  (PERCOCET) 5-325 MG tablet Take 1-2 tablets by mouth every 6 (six) hours as needed. TO BE TAKEN AFTER SURGERY 40 tablet 0   rosuvastatin  (CRESTOR ) 20 MG tablet Take 1 tablet (20 mg total) by mouth daily. 90 tablet 0   acetaminophen  (TYLENOL ) 650 MG CR tablet Take 650 mg by mouth every 8 (eight) hours as needed for pain. (Patient not taking: Reported on 11/07/2023)     amoxicillin  (AMOXIL ) 500 MG capsule Take 4 pills one hour prior to dental work (Patient not taking: Reported on 07/27/2023) 8 capsule 2   docusate sodium  (COLACE) 100 MG capsule Take 1 capsule (100 mg total) by mouth daily as needed. (Patient not taking: No sig reported) 30 capsule 2   docusate sodium  (COLACE) 100 MG capsule  Take 1 capsule (100 mg total) by mouth daily as needed. 30 capsule 2   fluticasone  (FLONASE ) 50 MCG/ACT nasal spray Place 2 sprays into both nostrils daily. (Patient not taking: No sig reported) 48 g 1   meclizine  (ANTIVERT ) 25 MG tablet Take 1 tablet (25 mg total) by mouth 3 (three) times daily as needed for dizziness. (Patient not taking: No sig reported) 20 tablet 0   traZODone  (DESYREL ) 100 MG tablet TAKE 2 TABLETS BY MOUTH AT BEDTIME. (Patient not taking: No sig reported) 180 tablet 0   No facility-administered medications prior to visit.    ROS Review of Systems   Constitutional:  Negative for appetite change, chills, diaphoresis, fatigue and fever.  HENT: Negative.    Eyes: Negative.   Respiratory:  Negative for cough, chest tightness, shortness of breath and wheezing.   Cardiovascular:  Negative for chest pain, palpitations and leg swelling.  Gastrointestinal:  Negative for abdominal pain, constipation, diarrhea, nausea and vomiting.  Endocrine: Negative.   Genitourinary: Negative.  Negative for difficulty urinating, dysuria and hematuria.  Musculoskeletal:  Positive for arthralgias. Negative for gait problem, joint swelling and myalgias.  Skin: Negative.   Neurological:  Positive for dizziness. Negative for weakness and light-headedness.  Hematological:  Negative for adenopathy. Does not bruise/bleed easily.  Psychiatric/Behavioral: Negative.  Negative for agitation. The patient is not nervous/anxious.     Objective:  BP 122/86 (BP Location: Left Arm, Patient Position: Sitting, Cuff Size: Normal) Comment: BP (R) 118/76  Pulse 62   Temp 98.1 F (36.7 C) (Oral)   Resp 16   Ht 5' 6 (1.676 m)   Wt 243 lb 6.4 oz (110.4 kg)   LMP 03/01/2004   SpO2 98%   BMI 39.29 kg/m   BP Readings from Last 3 Encounters:  11/23/23 122/86  11/07/23 (!) 154/86  08/22/23 132/68    Wt Readings from Last 3 Encounters:  11/23/23 243 lb 6.4 oz (110.4 kg)  11/07/23 240 lb (108.9 kg)  10/24/23 240 lb (108.9 kg)    Physical Exam Vitals reviewed.  Constitutional:      Appearance: Normal appearance.  Cardiovascular:     Rate and Rhythm: Regular rhythm. Bradycardia present. Occasional Extrasystoles are present.    Heart sounds: No murmur heard.    No friction rub. No gallop.     Comments: EKG--- SB with occasional PVC, 58 bpm No LVH, Q waves, or ST/T wave changes  Improved compared to the prior EKG Pulmonary:     Effort: Pulmonary effort is normal.     Breath sounds: No stridor. No wheezing, rhonchi or rales.  Abdominal:     General: Abdomen is  flat.     Palpations: There is no mass.     Tenderness: There is no abdominal tenderness. There is no guarding.     Hernia: No hernia is present.  Musculoskeletal:        General: Normal range of motion.     Right lower leg: No edema.     Left lower leg: No edema.  Skin:    General: Skin is warm and dry.  Neurological:     General: No focal deficit present.     Mental Status: She is alert. Mental status is at baseline.     Lab Results  Component Value Date   WBC 6.2 11/23/2023   HGB 13.7 11/23/2023   HCT 41.2 11/23/2023   PLT 224.0 11/23/2023   GLUCOSE 94 11/23/2023   CHOL 204 (H) 11/23/2023  TRIG 108.0 11/23/2023   HDL 73.80 11/23/2023   LDLDIRECT 155.6 02/16/2013   LDLCALC 108 (H) 11/23/2023   ALT 13 08/22/2023   AST 15 08/22/2023   NA 138 11/23/2023   K 3.8 11/23/2023   CL 105 11/23/2023   CREATININE 0.74 11/23/2023   BUN 23 11/23/2023   CO2 25 11/23/2023   TSH 1.72 11/23/2023   INR 3.2 11/24/2012   HGBA1C 5.8 08/22/2023    MM 3D SCREENING MAMMOGRAM BILATERAL BREAST Result Date: 10/28/2023 CLINICAL DATA:  Screening. EXAM: DIGITAL SCREENING BILATERAL MAMMOGRAM WITH TOMOSYNTHESIS AND CAD TECHNIQUE: Bilateral screening digital craniocaudal and mediolateral oblique mammograms were obtained. Bilateral screening digital breast tomosynthesis was performed. The images were evaluated with computer-aided detection. COMPARISON:  Previous exam(s). ACR Breast Density Category b: There are scattered areas of fibroglandular density. FINDINGS: There are no findings suspicious for malignancy. IMPRESSION: No mammographic evidence of malignancy. A result letter of this screening mammogram will be mailed directly to the patient. RECOMMENDATION: Screening mammogram in one year. (Code:SM-B-01Y) BI-RADS CATEGORY  1: Negative. Electronically Signed   By: Dina  Arceo M.D.   On: 10/28/2023 05:07    Assessment & Plan:   Hyperlipidemia with target LDL less than 130- LDL goal achieved. Doing  well on the statin  -     Lipid panel; Future -     TSH; Future -     Rosuvastatin  Calcium ; Take 1 tablet (20 mg total) by mouth daily.  Dispense: 90 tablet; Refill: 0  Essential hypertension- Her BP is over-controlled. Will discontinue the thiazide diuretic. -     EKG 12-Lead -     Basic metabolic panel with GFR; Future -     CBC with Differential/Platelet; Future  Acquired hypothyroidism- She is euthyroid. -     TSH; Future  Primary osteoarthritis of both hips -     Meloxicam ; Take 1 tablet (15 mg total) by mouth daily.  Dispense: 90 tablet; Refill: 0  Primary osteoarthritis of left hip -     Meloxicam ; Take 1 tablet (15 mg total) by mouth daily.  Dispense: 90 tablet; Refill: 0     Follow-up: Return in about 3 months (around 02/22/2024).  Debby Molt, MD

## 2023-11-23 NOTE — Patient Instructions (Signed)
 Hypertension, Adult High blood pressure (hypertension) is when the force of blood pumping through the arteries is too strong. The arteries are the blood vessels that carry blood from the heart throughout the body. Hypertension forces the heart to work harder to pump blood and may cause arteries to become narrow or stiff. Untreated or uncontrolled hypertension can lead to a heart attack, heart failure, a stroke, kidney disease, and other problems. A blood pressure reading consists of a higher number over a lower number. Ideally, your blood pressure should be below 120/80. The first ("top") number is called the systolic pressure. It is a measure of the pressure in your arteries as your heart beats. The second ("bottom") number is called the diastolic pressure. It is a measure of the pressure in your arteries as the heart relaxes. What are the causes? The exact cause of this condition is not known. There are some conditions that result in high blood pressure. What increases the risk? Certain factors may make you more likely to develop high blood pressure. Some of these risk factors are under your control, including: Smoking. Not getting enough exercise or physical activity. Being overweight. Having too much fat, sugar, calories, or salt (sodium) in your diet. Drinking too much alcohol. Other risk factors include: Having a personal history of heart disease, diabetes, high cholesterol, or kidney disease. Stress. Having a family history of high blood pressure and high cholesterol. Having obstructive sleep apnea. Age. The risk increases with age. What are the signs or symptoms? High blood pressure may not cause symptoms. Very high blood pressure (hypertensive crisis) may cause: Headache. Fast or irregular heartbeats (palpitations). Shortness of breath. Nosebleed. Nausea and vomiting. Vision changes. Severe chest pain, dizziness, and seizures. How is this diagnosed? This condition is diagnosed by  measuring your blood pressure while you are seated, with your arm resting on a flat surface, your legs uncrossed, and your feet flat on the floor. The cuff of the blood pressure monitor will be placed directly against the skin of your upper arm at the level of your heart. Blood pressure should be measured at least twice using the same arm. Certain conditions can cause a difference in blood pressure between your right and left arms. If you have a high blood pressure reading during one visit or you have normal blood pressure with other risk factors, you may be asked to: Return on a different day to have your blood pressure checked again. Monitor your blood pressure at home for 1 week or longer. If you are diagnosed with hypertension, you may have other blood or imaging tests to help your health care provider understand your overall risk for other conditions. How is this treated? This condition is treated by making healthy lifestyle changes, such as eating healthy foods, exercising more, and reducing your alcohol intake. You may be referred for counseling on a healthy diet and physical activity. Your health care provider may prescribe medicine if lifestyle changes are not enough to get your blood pressure under control and if: Your systolic blood pressure is above 130. Your diastolic blood pressure is above 80. Your personal target blood pressure may vary depending on your medical conditions, your age, and other factors. Follow these instructions at home: Eating and drinking  Eat a diet that is high in fiber and potassium, and low in sodium, added sugar, and fat. An example of this eating plan is called the DASH diet. DASH stands for Dietary Approaches to Stop Hypertension. To eat this way: Eat  plenty of fresh fruits and vegetables. Try to fill one half of your plate at each meal with fruits and vegetables. Eat whole grains, such as whole-wheat pasta, brown rice, or whole-grain bread. Fill about one  fourth of your plate with whole grains. Eat or drink low-fat dairy products, such as skim milk or low-fat yogurt. Avoid fatty cuts of meat, processed or cured meats, and poultry with skin. Fill about one fourth of your plate with lean proteins, such as fish, chicken without skin, beans, eggs, or tofu. Avoid pre-made and processed foods. These tend to be higher in sodium, added sugar, and fat. Reduce your daily sodium intake. Many people with hypertension should eat less than 1,500 mg of sodium a day. Do not drink alcohol if: Your health care provider tells you not to drink. You are pregnant, may be pregnant, or are planning to become pregnant. If you drink alcohol: Limit how much you have to: 0-1 drink a day for women. 0-2 drinks a day for men. Know how much alcohol is in your drink. In the [place] one drink equals one 12 oz bottle of beer (355 mL), one 5 oz glass of wine (148 mL), or one 1 oz glass of hard liquor (44 mL). Lifestyle  Work with your health care provider to maintain a healthy body weight or to lose weight. Ask what an ideal weight is for you. Get at least 30 minutes of exercise that causes your heart to beat faster (aerobic exercise) most days of the week. Activities may include walking, swimming, or biking. Include exercise to strengthen your muscles (resistance exercise), such as Pilates or lifting weights, as part of your weekly exercise routine. Try to do these types of exercises for 30 minutes at least 3 days a week. Do not use any products that contain nicotine or tobacco. These products include cigarettes, chewing tobacco, and vaping devices, such as e-cigarettes. If you need help quitting, ask your health care provider. Monitor your blood pressure at home as told by your health care provider. Keep all follow-up visits. This is important. Medicines Take over-the-counter and prescription medicines only as told by your health care provider. Follow directions carefully. Blood  pressure medicines must be taken as prescribed. Do not skip doses of blood pressure medicine. Doing this puts you at risk for problems and can make the medicine less effective. Ask your health care provider about side effects or reactions to medicines that you should watch for. Contact a health care provider if you: Think you are having a reaction to a medicine you are taking. Have headaches that keep coming back (recurring). Feel dizzy. Have swelling in your ankles. Have trouble with your vision. Get help right away if you: Develop a severe headache or confusion. Have unusual weakness or numbness. Feel faint. Have severe pain in your chest or abdomen. Vomit repeatedly. Have trouble breathing. These symptoms may be an emergency. Get help right away. Call 911. Do not wait to see if the symptoms will go away. Do not drive yourself to the hospital. Summary Hypertension is when the force of blood pumping through your arteries is too strong. If this condition is not controlled, it may put you at risk for serious complications. Your personal target blood pressure may vary depending on your medical conditions, your age, and other factors. For most people, a normal blood pressure is less than 120/80. Hypertension is treated with lifestyle changes, medicines, or a combination of both. Lifestyle changes include losing weight, eating a healthy,  low-sodium diet, exercising more, and limiting alcohol. This information is not intended to replace advice given to you by your health care provider. Make sure you discuss any questions you have with your health care provider. Document Revised: 12/23/2020 Document Reviewed: 12/23/2020 Elsevier Patient Education  2024 [employer].

## 2023-11-24 ENCOUNTER — Ambulatory Visit: Payer: Self-pay | Admitting: Internal Medicine

## 2023-11-24 MED ORDER — ROSUVASTATIN CALCIUM 20 MG PO TABS: 20.0000 mg | ORAL_TABLET | Freq: Every day | ORAL | 0 refills | Status: DC

## 2023-11-24 MED ORDER — MELOXICAM 15 MG PO TABS: 15.0000 mg | ORAL_TABLET | Freq: Every day | ORAL | 0 refills | Status: DC

## 2023-11-25 ENCOUNTER — Other Ambulatory Visit: Payer: Self-pay | Admitting: Internal Medicine

## 2023-11-25 DIAGNOSIS — M160 Bilateral primary osteoarthritis of hip: Secondary | ICD-10-CM

## 2023-11-25 DIAGNOSIS — M170 Bilateral primary osteoarthritis of knee: Secondary | ICD-10-CM

## 2023-11-25 DIAGNOSIS — F5104 Psychophysiologic insomnia: Secondary | ICD-10-CM

## 2023-11-25 NOTE — Telephone Encounter (Signed)
 Copied from CRM [id number]. Topic: Clinical - Medication Refill >> Nov 25, 2023 12:04 PM Suzen RAMAN wrote: Medication: Oxycodone  HCl 10 MG TABS QUEtiapine  (SEROQUEL ) 300 MG tablet   This is the patient's preferred pharmacy:  [id number] [address], [place] - [address] AT [facility] [address] [place] [address] Phone: [phone] Fax: [phone]   Is this the correct pharmacy for this prescription? Yes If no, delete pharmacy and type the correct one.   Has the prescription been filled recently? No  Is the patient out of the medication? Yes  Has the patient been seen for an appointment in the last year OR does the patient have an upcoming appointment? Yes  Can we respond through MyChart? Yes  Agent: Please be advised that Rx refills may take up to 3 business days. We ask that you follow-up with your pharmacy.

## 2023-11-26 MED ORDER — OXYCODONE HCL 10 MG PO TABS: 10.0000 mg | ORAL_TABLET | Freq: Three times a day (TID) | ORAL | 0 refills | Status: DC | PRN

## 2023-11-26 MED ORDER — QUETIAPINE FUMARATE 300 MG PO TABS: 300.0000 mg | ORAL_TABLET | Freq: Every day | ORAL | 0 refills | Status: DC

## 2023-11-29 NOTE — Progress Notes (Signed)
 Surgical Instructions   Your procedure is scheduled on Monday, October 6th, 2025. Report to [facility] Main Entrance A at 9:00 A.M., then check in with the Admitting office. Any questions or running late day of surgery: call [phone]  Questions prior to your surgery date: call [phone], Monday-Friday, 8am-4pm. If you experience any cold or flu symptoms such as cough, fever, chills, shortness of breath, etc. between now and your scheduled surgery, please notify us  at the above number.     Remember:  Do not eat after midnight the night before your surgery  You may drink clear liquids until 8:30 the morning of your surgery.   Clear liquids allowed are: Water, Non-Citrus Juices (without pulp), Carbonated Beverages, Clear Tea (no milk, honey, etc.), Black Coffee Only (NO MILK, CREAM OR POWDERED CREAMER of any kind), and Gatorade.    Take these medicines the morning of surgery with A SIP OF WATER: Levothyroxine  (Synthroid ) Nebivolol  (Bystolic ) Rosuvastatin  (Crestor )    May take these medicines IF NEEDED: Albuterol -Budesonide  (Airsupra ) - bring with you on the day of surgery Methocarbamol  (Robaxin ) Naloxone  (Narcan ) Oxycodone  HCL     One week prior to surgery, STOP taking any Aspirin (unless otherwise instructed by your surgeon) Aleve, Naproxen, Ibuprofen , Motrin , Advil , Goody's, BC's, all herbal medications, fish oil, and non-prescription vitamins.  This includes Meloxicam  (Mobic ).                      Do NOT Smoke (Tobacco/Vaping) for 24 hours prior to your procedure.  If you use a CPAP at night, you may bring your mask/headgear for your overnight stay.   You will be asked to remove any contacts, glasses, piercing's, hearing aid's, dentures/partials prior to surgery. Please bring cases for these items if needed.    Patients discharged the day of surgery will not be allowed to drive home, and someone needs to stay with them for 24 hours.  SURGICAL WAITING ROOM  VISITATION Patients may have no more than 2 support people in the waiting area - these visitors may rotate.   Pre-op nurse will coordinate an appropriate time for 1 ADULT support person, who may not rotate, to accompany patient in pre-op.  Children under the age of 73 must have an adult with them who is not the patient and must remain in the main waiting area with an adult.  If the patient needs to stay at the hospital during part of their recovery, the visitor guidelines for inpatient rooms apply.  Please refer to the [facility] website for the visitor guidelines for any additional information.   If you received a COVID test during your pre-op visit  it is requested that you wear a mask when out in public, stay away from anyone that may not be feeling well and notify your surgeon if you develop symptoms. If you have been in contact with anyone that has tested positive in the last 10 days please notify you surgeon.      Pre-operative 5 CHG Bathing Instructions   You can play a key role in reducing the risk of infection after surgery. Your skin needs to be as free of germs as possible. You can reduce the number of germs on your skin by washing with CHG (chlorhexidine  gluconate) soap before surgery. CHG is an antiseptic soap that kills germs and continues to kill germs even after washing.   DO NOT use if you have an allergy to chlorhexidine /CHG or antibacterial soaps. If your skin becomes reddened  or irritated, stop using the CHG and notify one of our RNs at [phone].   Please shower with the CHG soap starting 4 days before surgery using the following schedule:     Please keep in mind the following:  DO NOT shave, including legs and underarms, starting the day of your first shower.   You may shave your face at any point before/day of surgery.  Place clean sheets on your bed the day you start using CHG soap. Use a clean washcloth (not used since being washed) for each shower. DO NOT  sleep with pets once you start using the CHG.   CHG Shower Instructions:  Wash your face and private area with normal soap. If you choose to wash your hair, wash first with your normal shampoo.  After you use shampoo/soap, rinse your hair and body thoroughly to remove shampoo/soap residue.  Turn the water OFF and apply about 3 tablespoons (45 ml) of CHG soap to a CLEAN washcloth.  Apply CHG soap ONLY FROM YOUR NECK DOWN TO YOUR TOES (washing for 3-5 minutes)  DO NOT use CHG soap on face, private areas, open wounds, or sores.  Pay special attention to the area where your surgery is being performed.  If you are having back surgery, having someone wash your back for you may be helpful. Wait 2 minutes after CHG soap is applied, then you may rinse off the CHG soap.  Pat dry with a clean towel  Put on clean clothes/pajamas   If you choose to wear lotion, please use ONLY the CHG-compatible lotions that are listed below.  Additional instructions for the day of surgery: DO NOT APPLY any lotions, deodorants, cologne, or perfumes.   Do not bring valuables to the hospital. [facility] is not responsible for any belongings/valuables. Do not wear nail polish, gel polish, artificial nails, or any other type of covering on natural nails (fingers and toes) Do not wear jewelry or makeup Put on clean/comfortable clothes.  Please brush your teeth.  Ask your nurse before applying any prescription medications to the skin.     CHG Compatible Lotions   Aveeno Moisturizing lotion  Cetaphil Moisturizing Cream  Cetaphil Moisturizing Lotion  Clairol Herbal Essence Moisturizing Lotion, Dry Skin  Clairol Herbal Essence Moisturizing Lotion, Extra Dry Skin  Clairol Herbal Essence Moisturizing Lotion, Normal Skin  Curel Age Defying Therapeutic Moisturizing Lotion with Alpha Hydroxy  Curel Extreme Care Body Lotion  Curel Soothing Hands Moisturizing Hand Lotion  Curel Therapeutic Moisturizing Cream,  Fragrance-Free  Curel Therapeutic Moisturizing Lotion, Fragrance-Free  Curel Therapeutic Moisturizing Lotion, Original Formula  Eucerin Daily Replenishing Lotion  Eucerin Dry Skin Therapy Plus Alpha Hydroxy Crme  Eucerin Dry Skin Therapy Plus Alpha Hydroxy Lotion  Eucerin Original Crme  Eucerin Original Lotion  Eucerin Plus Crme Eucerin Plus Lotion  Eucerin TriLipid Replenishing Lotion  Keri Anti-Bacterial Hand Lotion  Keri Deep Conditioning Original Lotion Dry Skin Formula Softly Scented  Keri Deep Conditioning Original Lotion, Fragrance Free Sensitive Skin Formula  Keri Lotion Fast Absorbing Fragrance Free Sensitive Skin Formula  Keri Lotion Fast Absorbing Softly Scented Dry Skin Formula  Keri Original Lotion  Keri Skin Renewal Lotion Keri Silky Smooth Lotion  Keri Silky Smooth Sensitive Skin Lotion  Nivea Body Creamy Conditioning Oil  Nivea Body Extra Enriched Teacher, adult education Moisturizing Lotion Nivea Crme  Nivea Skin Firming Lotion  NutraDerm 30 Skin Lotion  NutraDerm Skin Lotion  NutraDerm Therapeutic Skin Cream  NutraDerm Therapeutic Skin Lotion  ProShield Protective Hand Cream  Provon moisturizing lotion  Please read over the following fact sheets that you were given.

## 2023-11-30 ENCOUNTER — Encounter (HOSPITAL_COMMUNITY): Payer: Self-pay

## 2023-11-30 ENCOUNTER — Encounter (HOSPITAL_COMMUNITY)
Admission: RE | Admit: 2023-11-30 | Discharge: 2023-11-30 | Disposition: A | Discharge status: Home / Self Care | Source: Ambulatory Visit | Attending: Orthopaedic Surgery | Admitting: Orthopaedic Surgery

## 2023-11-30 ENCOUNTER — Other Ambulatory Visit: Payer: Self-pay

## 2023-11-30 VITALS — BP 148/80 | HR 62 | Temp 98.3°F | Resp 18 | Ht 66.0 in | Wt 250.8 lb

## 2023-11-30 DIAGNOSIS — Z01818 Encounter for other preprocedural examination: Secondary | ICD-10-CM | POA: Diagnosis present

## 2023-11-30 DIAGNOSIS — Z01812 Encounter for preprocedural laboratory examination: Secondary | ICD-10-CM | POA: Diagnosis not present

## 2023-11-30 LAB — SURGICAL PCR SCREEN
MRSA, PCR: NEGATIVE
Staphylococcus aureus: NEGATIVE

## 2023-11-30 LAB — TYPE AND SCREEN
ABO/RH(D): A POS
Antibody Screen: NEGATIVE

## 2023-11-30 NOTE — Progress Notes (Signed)
 PCP - Debby Molt, MD Cardiologist - denies  PPM/ICD - denies Device Orders - n/a Rep Notified - n/a  Chest x-ray - 04/06/2023 EKG - 11/23/2023 Stress Test - denies ECHO - 05/29/2015 Cardiac Cath - denies  Sleep Study - denies CPAP - n/a  Fasting Blood Sugar - no DM Checks Blood Sugar _____ times a day  Last dose of GLP1 agonist-  n/a GLP1 instructions: n/a  Blood Thinner Instructions: n/a Aspirin Instructions: n/a  ERAS Protcol -yes PRE-SURGERY Ensure or G2- Ensure  COVID TEST- n/a   Anesthesia review: yes, hx of HTN, cardiac studies (Echo in 2017)  Patient denies shortness of breath, fever, cough and chest pain at PAT appointment   All instructions explained to the patient, with a verbal understanding of the material. Patient agrees to go over the instructions while at home for a better understanding. Patient also instructed to self quarantine after being tested for COVID-19. The opportunity to ask questions was provided.

## 2023-11-30 NOTE — Pre-Procedure Instructions (Signed)
 Surgical Instructions     Your procedure is scheduled on Monday, October 6th, 2025. Report to [facility] Main Entrance A at 9:00 A.M., then check in with the Admitting office. Any questions or running late day of surgery: call [phone]   Questions prior to your surgery date: call [phone], Monday-Friday, 8am-4pm. If you experience any cold or flu symptoms such as cough, fever, chills, shortness of breath, etc. between now and your scheduled surgery, please notify us  at the above number.            Remember:       Do not eat after midnight the night before your surgery   You may drink clear liquids until 8:30 the morning of your surgery.   Clear liquids allowed are: Water, Non-Citrus Juices (without pulp), Carbonated Beverages, Clear Tea (no milk, honey, etc.), Black Coffee Only (NO MILK, CREAM OR POWDERED CREAMER of any kind), and Gatorade.  Patient Instructions  The night before surgery:  No food after midnight. ONLY clear liquids after midnight  The day of surgery (if you do NOT have diabetes):  Drink ONE (1) Pre-Surgery Clear Ensure by 0830 the morning of surgery. Drink in one sitting. Do not sip.  This drink was given to you during your hospital  pre-op appointment visit.  Nothing else to drink after completing the  Pre-Surgery Clear Ensure.         Take these medicines the morning of surgery with A SIP OF WATER: Levothyroxine  (Synthroid ) Nebivolol  (Bystolic ) Rosuvastatin  (Crestor )       May take these medicines IF NEEDED: Albuterol -Budesonide  (Airsupra ) - bring with you on the day of surgery Methocarbamol  (Robaxin ) Naloxone  (Narcan ) Oxycodone  HCL         One week prior to surgery, STOP taking any Aspirin (unless otherwise instructed by your surgeon) Aleve, Naproxen, Ibuprofen , Motrin , Advil , Goody's, BC's, all herbal medications, fish oil, and non-prescription vitamins.  This includes Meloxicam  (Mobic ).                      Do NOT Smoke  (Tobacco/Vaping) for 24 hours prior to your procedure.   If you use a CPAP at night, you may bring your mask/headgear for your overnight stay.   You will be asked to remove any contacts, glasses, piercing's, hearing aid's, dentures/partials prior to surgery. Please bring cases for these items if needed.    Patients discharged the day of surgery will not be allowed to drive home, and someone needs to stay with them for 24 hours.   SURGICAL WAITING ROOM VISITATION Patients may have no more than 2 support people in the waiting area - these visitors may rotate.   Pre-op nurse will coordinate an appropriate time for 1 ADULT support person, who may not rotate, to accompany patient in pre-op.  Children under the age of 42 must have an adult with them who is not the patient and must remain in the main waiting area with an adult.   If the patient needs to stay at the hospital during part of their recovery, the visitor guidelines for inpatient rooms apply.   Please refer to the [facility] website for the visitor guidelines for any additional information.     If you received a COVID test during your pre-op visit  it is requested that you wear a mask when out in public, stay away from anyone that may not be feeling well and notify your surgeon if you develop symptoms. If you have been  in contact with anyone that has tested positive in the last 10 days please notify you surgeon.         Pre-operative 5 CHG Bathing Instructions    You can play a key role in reducing the risk of infection after surgery. Your skin needs to be as free of germs as possible. You can reduce the number of germs on your skin by washing with CHG (chlorhexidine  gluconate) soap before surgery. CHG is an antiseptic soap that kills germs and continues to kill germs even after washing.    DO NOT use if you have an allergy to chlorhexidine /CHG or antibacterial soaps. If your skin becomes reddened or irritated, stop using the CHG and  notify one of our RNs at [phone].    Please shower with the CHG soap starting 4 days before surgery using the following schedule:       Please keep in mind the following:  DO NOT shave, including legs and underarms, starting the day of your first shower.   You may shave your face at any point before/day of surgery.  Place clean sheets on your bed the day you start using CHG soap. Use a clean washcloth (not used since being washed) for each shower. DO NOT sleep with pets once you start using the CHG.    CHG Shower Instructions:  Wash your face and private area with normal soap. If you choose to wash your hair, wash first with your normal shampoo.  After you use shampoo/soap, rinse your hair and body thoroughly to remove shampoo/soap residue.  Turn the water OFF and apply about 3 tablespoons (45 ml) of CHG soap to a CLEAN washcloth.  Apply CHG soap ONLY FROM YOUR NECK DOWN TO YOUR TOES (washing for 3-5 minutes)  DO NOT use CHG soap on face, private areas, open wounds, or sores.  Pay special attention to the area where your surgery is being performed.  If you are having back surgery, having someone wash your back for you may be helpful. Wait 2 minutes after CHG soap is applied, then you may rinse off the CHG soap.  Pat dry with a clean towel  Put on clean clothes/pajamas   If you choose to wear lotion, please use ONLY the CHG-compatible lotions that are listed below.   Additional instructions for the day of surgery: DO NOT APPLY any lotions, deodorants, cologne, or perfumes.   Do not bring valuables to the hospital. [facility] is not responsible for any belongings/valuables. Do not wear nail polish, gel polish, artificial nails, or any other type of covering on natural nails (fingers and toes) Do not wear jewelry or makeup Put on clean/comfortable clothes.  Please brush your teeth.  Ask your nurse before applying any prescription medications to the skin.        CHG Compatible  Lotions    Aveeno Moisturizing lotion  Cetaphil Moisturizing Cream  Cetaphil Moisturizing Lotion  Clairol Herbal Essence Moisturizing Lotion, Dry Skin  Clairol Herbal Essence Moisturizing Lotion, Extra Dry Skin  Clairol Herbal Essence Moisturizing Lotion, Normal Skin  Curel Age Defying Therapeutic Moisturizing Lotion with Alpha Hydroxy  Curel Extreme Care Body Lotion  Curel Soothing Hands Moisturizing Hand Lotion  Curel Therapeutic Moisturizing Cream, Fragrance-Free  Curel Therapeutic Moisturizing Lotion, Fragrance-Free  Curel Therapeutic Moisturizing Lotion, Original Formula  Eucerin Daily Replenishing Lotion  Eucerin Dry Skin Therapy Plus Alpha Hydroxy Crme  Eucerin Dry Skin Therapy Plus Alpha Hydroxy Lotion  Eucerin Original Crme  Eucerin Original Lotion  Eucerin Plus Crme Eucerin Plus Lotion  Eucerin TriLipid Replenishing Lotion  Keri Anti-Bacterial Hand Lotion  Keri Deep Conditioning Original Lotion Dry Skin Formula Softly Scented  Keri Deep Conditioning Original Lotion, Fragrance Free Sensitive Skin Formula  Keri Lotion Fast Absorbing Fragrance Free Sensitive Skin Formula  Keri Lotion Fast Absorbing Softly Scented Dry Skin Formula  Keri Original Lotion  Keri Skin Renewal Lotion Keri Silky Smooth Lotion  Keri Silky Smooth Sensitive Skin Lotion  Nivea Body Creamy Conditioning Oil  Nivea Body Extra Enriched Lotion  Nivea Body Original Lotion  Nivea Body Sheer Moisturizing Lotion Nivea Crme  Nivea Skin Firming Lotion  NutraDerm 30 Skin Lotion  NutraDerm Skin Lotion  NutraDerm Therapeutic Skin Cream  NutraDerm Therapeutic Skin Lotion  ProShield Protective Hand Cream  Provon moisturizing lotion   Please read over the following fact sheets that you were given.

## 2023-12-01 ENCOUNTER — Encounter: Payer: Self-pay | Admitting: Internal Medicine

## 2023-12-01 ENCOUNTER — Ambulatory Visit: Attending: Internal Medicine | Admitting: Internal Medicine

## 2023-12-01 VITALS — BP 154/90 | HR 65 | Temp 97.6°F | Resp 16 | Ht 66.5 in | Wt 254.4 lb

## 2023-12-01 DIAGNOSIS — M159 Polyosteoarthritis, unspecified: Secondary | ICD-10-CM | POA: Diagnosis not present

## 2023-12-01 DIAGNOSIS — R7689 Other specified abnormal immunological findings in serum: Secondary | ICD-10-CM | POA: Diagnosis not present

## 2023-12-01 NOTE — Patient Instructions (Signed)
 I recommend checking out the [place] [facility] [place] patient-centered guide for fibromyalgia and chronic pain management: [email or web address]

## 2023-12-01 NOTE — Progress Notes (Signed)
 Office Visit Note  Patient: Denise Morgan             Date of Birth: 10/03/1965           MRN: [id number]             PCP: Joshua Debby CROME, MD Referring: Jerri Kay HERO, MD Visit Date: 12/01/2023 Occupation: Data Unavailable  Subjective:  New Patient (Initial Visit), Joint Pain, and Pain    Discussed the use of AI scribe software for clinical note transcription with the patient, who gave verbal consent to proceed.  History of Present Illness   Denise Morgan is a 58 year old female with severe hip osteoarthritis who presents with persistent joint pain and abnormal lab test results. She was referred by Dr. Jerri for evaluation of persistent joint pain and abnormal lab test results.  She experiences severe joint pain affecting her hips, knees, hands, and back, which has been worsening over the past three to four years. Despite undergoing a hip replacement, she continues to experience significant pain, described as 'everything hurts'. The pain is exacerbated by prolonged sitting, excessive movement, and during the night, making it difficult for her to get up and move around. She is scheduled for another hip replacement on the left side. The recovery from the first hip replacement was slower than expected, and she continues to experience pain similar to the joint pain in other areas.  Her current medications include oxycodone  10 mg three times a day, which provides some relief but does not eliminate the pain. She was previously on hydrocodone  before switching to oxycodone . She also takes Seroquel  at night to aid with sleep, which has improved her sleep quality. She was taking meloxicam  but has stopped in preparation for her upcoming surgery.  She experiences swelling in her knees and hands, particularly at night, and reports new numbness in her hands, which occurs both at night and while sitting. She uses topical treatments like Fortean and has previously used patches prescribed by a  doctor.  Her family history is significant for arthritis, with her mother, grandmother, and great-grandmother all affected. She has not had any major joint injuries or surgeries aside from the hip replacements.  She reports a rash on her face that is itchy but not painful, which has come and gone. No circulation problems like fingers turning blue or white, but she does experience numbness.  She has a history of a hysterectomy due to fibroids and a pulmonary embolism, with one ovary remaining. She experiences hot flashes, which have been present since her twenties, and continues to have them despite the hysterectomy.       Labs reviewed 05/2023 ANA 1:40 homogenous RF neg ESR wnl  Hx of ACA Ab TPO neg 2018  Activities of Daily Living:  Patient reports morning stiffness for 1-2 hours.   Patient Reports nocturnal pain.  Difficulty dressing/grooming: Reports Difficulty climbing stairs: Reports Difficulty getting out of chair: Reports Difficulty using hands for taps, buttons, cutlery, and/or writing: Reports  Review of Systems  Constitutional:  Positive for fatigue.  HENT:  Positive for mouth dryness. Negative for mouth sores.   Eyes:  Negative for dryness.  Respiratory:  Positive for shortness of breath.   Cardiovascular:  Positive for chest pain and palpitations.  Gastrointestinal:  Positive for constipation. Negative for blood in stool and diarrhea.  Endocrine: Negative for increased urination.  Genitourinary:  Positive for involuntary urination.  Musculoskeletal:  Positive for joint pain, joint pain, joint  swelling, myalgias, muscle weakness, morning stiffness, muscle tenderness and myalgias. Negative for gait problem.  Skin:  Positive for hair loss. Negative for color change, rash and sensitivity to sunlight.  Allergic/Immunologic: Positive for susceptible to infections.  Neurological:  Positive for dizziness and headaches.  Hematological:  Negative for swollen glands.   Psychiatric/Behavioral:  Positive for depressed mood and sleep disturbance. The patient is not nervous/anxious.     PMFS History:  Patient Active Problem List   Diagnosis Date Noted   Status post total replacement of left hip 12/05/2023   Positive ANA (antinuclear antibody) 12/01/2023   Generalized osteoarthritis of multiple sites 12/01/2023   Acquired hemophilia (HCC) 11/23/2023   Unilateral primary osteoarthritis, left hip 09/06/2023   Drug-induced hypokalemia 08/22/2023   Primary osteoarthritis of both hips 08/22/2023   Need for vaccination 04/12/2023   Bradycardia 04/12/2023   Hyperplastic colonic polyp 04/06/2023   Psychophysiological insomnia 04/06/2023   Screening for colon cancer 01/04/2023   Prolonged Q-T interval on ECG 01/04/2023   Seasonal allergic rhinitis due to fungal spores 04/05/2022   Long-term current use of opiate analgesic 02/16/2020   Acquired hypothyroidism 02/12/2020   Chronic hip pain, bilateral 05/22/2019   Essential hypertension 04/07/2016   Snoring 04/07/2016   Hx of adenomatous polyp of colon 08/15/2015   Asthma, mild intermittent 05/29/2015   Hyperlipidemia with target LDL less than 130 05/29/2015   Obesity, morbid (HCC) 02/16/2013   Tobacco abuse disorder 11/19/2012   Visit for screening mammogram 11/17/2012   DJD (degenerative joint disease) of knee 11/17/2012   Chronic pain syndrome 11/17/2012    Past Medical History:  Diagnosis Date   Allergy    Anemia    15+ years ago   Arthritis    Asthma    Blood transfusion without reported diagnosis    Depression    Dysrhythmia    Palpitations - Holter monitor in 2024   Family history of adverse reaction to anesthesia    sister had episode with anesthesia once, had a rection; had to start CPR   Frequent headaches    Resolved as of 2024   History of phlebitis    Hx of adenomatous polyp of colon 08/15/2015   Hypertension    Hypothyroidism    Palpitations    heart monitor all normal per  pt.2017   PE (pulmonary embolism)     Family History  Problem Relation Age of Onset   Hypertension Mother    Colon polyps Mother    Ovarian cancer Mother        had , no longer   Arthritis Mother    Hypertension Father    Asthma Father    Hypertension Brother    Colon cancer Maternal Aunt    Esophageal cancer Neg Hx    Rectal cancer Neg Hx    Stomach cancer Neg Hx    Past Surgical History:  Procedure Laterality Date   ABDOMINAL HYSTERECTOMY  03/01/2004   CHOLECYSTECTOMY     COLONOSCOPY W/ POLYPECTOMY  07/2020   TOTAL HIP ARTHROPLASTY Right 02/21/2023   Procedure: RIGHT TOTAL HIP ARTHROPLASTY ANTERIOR APPROACH;  Surgeon: Jerri Kay HERO, MD;  Location: [facility] OR;  Service: Orthopedics;  Laterality: Right;  3-C   TOTAL HIP ARTHROPLASTY Left 12/05/2023   Procedure: LEFT HIP ARTHROPLASTY, ANTERIOR APPROACH;  Surgeon: Jerri Kay HERO, MD;  Location: [facility] OR;  Service: Orthopedics;  Laterality: Left;   Social History   Tobacco Use   Smoking status: Every Day    Current packs/day:  0.75    Average packs/day: 0.8 packs/day for 33.0 years (24.8 ttl pk-yrs)    Types: Cigarettes    Passive exposure: Current   Smokeless tobacco: Never  Vaping Use   Vaping status: Former  Substance Use Topics   Alcohol use: Yes    Comment: 2-3x/week - wine   Drug use: No   Social History   Social History Narrative   Not on file     Immunization History  Administered Date(s) Administered    Astrazeneca Covid-19 Vaccine, Pf, 0.5 Ml Non Us   11/28/2021   Influenza Split 12/15/2020   Influenza, Seasonal, Injecte, Preservative Fre 12/07/2022   Influenza,inj,Quad PF,6+ Mos 12/15/2016, 01/20/2018, 02/12/2020, 11/28/2021   Influenza,inj,quad, With Preservative 11/28/2021   Influenza-Unspecified 11/29/2018   PFIZER(Purple Top)SARS-COV-2 Vaccination 02/13/2020   PNEUMOCOCCAL CONJUGATE-20 04/06/2023   Pfizer Covid-19 Vaccine Bivalent Booster 65yrs & up 12/15/2020   Pfizer(Comirnaty)Fall Seasonal Vaccine 12  years and older 12/07/2022   Pneumococcal-Unspecified 11/18/2010   Tdap 02/16/2013, 04/06/2023   Unspecified SARS-COV-2 Vaccination 11/28/2021   Zoster Recombinant(Shingrix ) 08/11/2021     Objective: Vital Signs: BP (!) 154/90 (BP Location: Left Arm, Patient Position: Sitting, Cuff Size: Normal)   Pulse 65   Temp 97.6 F (36.4 C)   Resp 16   Ht 5' 6.5 (1.689 m)   Wt 254 lb 6.4 oz (115.4 kg)   LMP 03/01/2004   BMI 40.45 kg/m    Physical Exam Eyes:     Conjunctiva/sclera: Conjunctivae normal.  Cardiovascular:     Rate and Rhythm: Normal rate and regular rhythm.  Pulmonary:     Effort: Pulmonary effort is normal.     Breath sounds: Normal breath sounds.  Musculoskeletal:     Comments: Trace b/l ankle edema  Lymphadenopathy:     Cervical: No cervical adenopathy.  Skin:    General: Skin is warm and dry.  Neurological:     Mental Status: She is alert.  Psychiatric:        Mood and Affect: Mood normal.      Musculoskeletal Exam:  Neck full ROM no tenderness Left shoulder ROM limited Elbows full ROM no tenderness or swelling Wrists full ROM no tenderness or swelling Fingers full ROM no tenderness or swelling Knees full ROM no tenderness or swelling, right knee crepitus Ankles full ROM no tenderness or swelling    Investigation: No additional findings.  Imaging: DG Pelvis Portable Result Date: 12/05/2023 CLINICAL DATA:  Status post left hip arthroplasty. EXAM: PORTABLE PELVIS 1-2 VIEWS COMPARISON:  09/06/2023 FINDINGS: Left hip arthroplasty in expected alignment. No periprosthetic lucency or fracture. Recent postsurgical change includes air and edema in the soft tissues. Previous right hip arthroplasty. IMPRESSION: Left hip arthroplasty without immediate postoperative complication. Electronically Signed   By: Andrea Gasman M.D.   On: 12/05/2023 15:13   DG HIP UNILAT WITH PELVIS 1V LEFT Result Date: 12/05/2023 CLINICAL DATA:  Elective surgery. EXAM: DG HIP (WITH  OR WITHOUT PELVIS) 1V*L* COMPARISON:  None Available. FINDINGS: Seven fluoroscopic spot views of the pelvis and left hip obtained in the operating room. Sequential images during hip arthroplasty. Fluoroscopy time 25.8. Dose 4.35 mGy. Previous right hip arthroplasty. IMPRESSION: Intraoperative fluoroscopy during left hip arthroplasty. Electronically Signed   By: Andrea Gasman M.D.   On: 12/05/2023 13:35   DG C-Arm 1-60 Min-No Report Result Date: 12/05/2023 Fluoroscopy was utilized by the requesting physician.  No radiographic interpretation.   DG C-Arm 1-60 Min-No Report Result Date: 12/05/2023 Fluoroscopy was utilized by the requesting physician.  No radiographic interpretation.    Recent Labs: Lab Results  Component Value Date   WBC 6.2 11/23/2023   HGB 13.7 11/23/2023   PLT 224.0 11/23/2023   NA 138 11/23/2023   K 3.8 11/23/2023   CL 105 11/23/2023   CO2 25 11/23/2023   GLUCOSE 94 11/23/2023   BUN 23 11/23/2023   CREATININE 0.74 11/23/2023   BILITOT 0.9 08/22/2023   ALKPHOS 64 08/22/2023   AST 15 08/22/2023   ALT 13 08/22/2023   PROT 7.0 08/22/2023   ALBUMIN 4.1 08/22/2023   CALCIUM  10.0 11/23/2023   GFRAA >90 11/29/2013    Speciality Comments: No specialty comments available.  Procedures:  No procedures performed Allergies: Latex   Assessment / Plan:     Visit Diagnoses: Positive ANA (antinuclear antibody) - Plan: Anti-DNA antibody, double-stranded, C3 and C4, Anti-Smith antibody, Sjogrens syndrome-A extractable nuclear antibody, RNP Antibody, Cardiolipin antibodies, IgG, IgM, IgA, Beta-2 glycoprotein antibodies, Sedimentation rate Positive ANA test with unclear significance. No specific clinical criteria or symptoms noted on exam and history today. Will check additional Ab panel but lower pretest suspicion. - Order additional blood tests to assess for inflammation and rule out autoimmune disease as detailed above.       Generalized osteoarthritis with severe left hip  involvement, status post right hip replacement, left hip replacement planned Generalized osteoarthritis with severe left hip involvement. Persistent pain post right hip replacement. Pain and stiffness in multiple joints, exacerbated by sitting or movement. Going for hip replacement Monday. Osteoarthritis of hands and knees with pain and swelling. Noted crepitus in right knee and swelling in ankles. - Continue using topical treatments like Voltaren for hand and knee arthritis. - Provide a type of lidcaine patch for trial to see if it helps with pain.  Chronic pain syndrome with fibromyalgia features Chronic pain syndrome with fibromyalgia features. Widespread musculoskeletal pain, fatigue, and sleep disturbances. Suspected combination of generalized osteoarthritis and fibromyalgia. - Consider starting neuropathy-type medications (e.g., SNRIs like Effexor or Cymbalta) post-surgery if blood tests are negative for autoimmune disease. - Provide information on neuropathy-type medications for potential future use.  Left shoulder pain with limited range of motion, possible rotator cuff tear Left shoulder pain with limited range of motion, possibly due to rotator cuff tear or osteoarthritis.  Vasomotor symptoms (hot flashes) in post-hysterectomy state with one ovary remaining Vasomotor symptoms in post-hysterectomy state with one ovary remaining. Hormone replacement therapy contraindicated due to history of pulmonary embolism. - Consider SNRI medications post-surgery for vasomotor symptoms if blood tests are negative for autoimmune disease.   Orders: Orders Placed This Encounter  Procedures   Anti-DNA antibody, double-stranded   C3 and C4   Anti-Smith antibody   Sjogrens syndrome-A extractable nuclear antibody   RNP Antibody   Cardiolipin antibodies, IgG, IgM, IgA   Beta-2 glycoprotein antibodies   Sedimentation rate   No orders of the defined types were placed in this  encounter.    Follow-Up Instructions: Return if symptoms worsen or fail to improve.   Lonni LELON Ester, MD  Note - This record has been created using [employer].  Chart creation errors have been sought, but may not always  have been located. Such creation errors do not reflect on  the standard of medical care.

## 2023-12-02 MED ORDER — TRANEXAMIC ACID 1000 MG/10ML IV SOLN
2000.0000 mg | INTRAVENOUS | Status: DC
  Filled 2023-12-02: qty 20

## 2023-12-02 NOTE — Telephone Encounter (Unsigned)
 Copied from CRM #[id number]. Topic: Clinical - Medication Prior Auth >> Dec 02, 2023 11:08 AM Berneda FALCON wrote: Reason for CRM: Pt states she is having trouble getting pain medication stating that she needs a prior auth. Needs this because she is having surgery Monday 10/6.   [id number] [address], [place] - [address] AT [facility] [address] [place] [address] Phone: [phone] Fax: [phone] Hours: Not open 24 hours  Oxycodone  HCl 10 MG TABS

## 2023-12-04 ENCOUNTER — Other Ambulatory Visit: Payer: Self-pay | Admitting: Physician Assistant

## 2023-12-04 LAB — RNP ANTIBODY: Ribonucleic Protein(ENA) Antibody, IgG: <1 AI

## 2023-12-04 LAB — BETA-2 GLYCOPROTEIN ANTIBODIES
Beta-2 Glyco 1 IgA: <2 U/mL (ref <20.0)
Beta-2 Glyco 1 IgM: 3.8 U/mL (ref <20.0)
Beta-2 Glyco I IgG: <2 U/mL (ref <20.0)

## 2023-12-04 LAB — SEDIMENTATION RATE: Sed Rate: 6 mm/h (ref 0–30)

## 2023-12-04 LAB — C3 AND C4
C3 Complement: 154 mg/dL (ref 83–193)
C4 Complement: 31 mg/dL (ref 15–57)

## 2023-12-04 LAB — ANTI-SMITH ANTIBODY: ENA SM Ab Ser-aCnc: <1 AI

## 2023-12-04 LAB — CARDIOLIPIN ANTIBODIES, IGG, IGM, IGA
Anticardiolipin IgA: <2 [APL'U]/mL (ref <20.0)
Anticardiolipin IgG: <2 [GPL'U]/mL (ref <20.0)
Anticardiolipin IgM: <2 [MPL'U]/mL (ref <20.0)

## 2023-12-04 LAB — SJOGRENS SYNDROME-A EXTRACTABLE NUCLEAR ANTIBODY: SSA (Ro) (ENA) Antibody, IgG: <1 AI

## 2023-12-04 LAB — ANTI-DNA ANTIBODY, DOUBLE-STRANDED: ds DNA Ab: <1 [IU]/mL

## 2023-12-04 MED ORDER — HYDROCODONE-ACETAMINOPHEN 5-325 MG PO TABS: 1.0000 | ORAL_TABLET | Freq: Three times a day (TID) | ORAL | 0 refills | Status: DC | PRN

## 2023-12-05 ENCOUNTER — Other Ambulatory Visit: Payer: Self-pay | Admitting: Physician Assistant

## 2023-12-05 ENCOUNTER — Observation Stay (HOSPITAL_COMMUNITY)

## 2023-12-05 ENCOUNTER — Other Ambulatory Visit (HOSPITAL_COMMUNITY): Payer: Self-pay

## 2023-12-05 ENCOUNTER — Other Ambulatory Visit: Payer: Self-pay

## 2023-12-05 ENCOUNTER — Ambulatory Visit (HOSPITAL_COMMUNITY): Payer: Self-pay | Admitting: Physician Assistant

## 2023-12-05 ENCOUNTER — Telehealth: Payer: Self-pay

## 2023-12-05 ENCOUNTER — Ambulatory Visit (HOSPITAL_COMMUNITY)

## 2023-12-05 ENCOUNTER — Encounter (HOSPITAL_COMMUNITY)
Admission: RE | Disposition: A | Discharge status: Home / Self Care | Payer: Self-pay | Source: Home / Self Care | Attending: Orthopaedic Surgery

## 2023-12-05 ENCOUNTER — Observation Stay (HOSPITAL_COMMUNITY)
Admission: RE | Admit: 2023-12-05 | Discharge: 2023-12-06 | Disposition: A | Discharge status: Home / Self Care | Attending: Orthopaedic Surgery | Admitting: Orthopaedic Surgery

## 2023-12-05 ENCOUNTER — Encounter (HOSPITAL_COMMUNITY): Payer: Self-pay | Admitting: Orthopaedic Surgery

## 2023-12-05 DIAGNOSIS — E039 Hypothyroidism, unspecified: Secondary | ICD-10-CM | POA: Diagnosis not present

## 2023-12-05 DIAGNOSIS — I10 Essential (primary) hypertension: Secondary | ICD-10-CM | POA: Insufficient documentation

## 2023-12-05 DIAGNOSIS — Z7901 Long term (current) use of anticoagulants: Secondary | ICD-10-CM | POA: Diagnosis not present

## 2023-12-05 DIAGNOSIS — Z6839 Body mass index (BMI) 39.0-39.9, adult: Secondary | ICD-10-CM

## 2023-12-05 DIAGNOSIS — M1612 Unilateral primary osteoarthritis, left hip: Secondary | ICD-10-CM

## 2023-12-05 DIAGNOSIS — F17210 Nicotine dependence, cigarettes, uncomplicated: Secondary | ICD-10-CM | POA: Diagnosis not present

## 2023-12-05 DIAGNOSIS — Z96641 Presence of right artificial hip joint: Secondary | ICD-10-CM | POA: Diagnosis not present

## 2023-12-05 DIAGNOSIS — Z91040 Latex allergy status: Secondary | ICD-10-CM | POA: Diagnosis not present

## 2023-12-05 DIAGNOSIS — J45909 Unspecified asthma, uncomplicated: Secondary | ICD-10-CM | POA: Insufficient documentation

## 2023-12-05 DIAGNOSIS — Z79899 Other long term (current) drug therapy: Secondary | ICD-10-CM | POA: Insufficient documentation

## 2023-12-05 DIAGNOSIS — Z96642 Presence of left artificial hip joint: Secondary | ICD-10-CM

## 2023-12-05 HISTORY — PX: TOTAL HIP ARTHROPLASTY: SHX124

## 2023-12-05 SURGERY — ARTHROPLASTY, HIP, TOTAL, ANTERIOR APPROACH
Anesthesia: Monitor Anesthesia Care | Site: Hip | Laterality: Left

## 2023-12-05 MED ORDER — CHLORHEXIDINE GLUCONATE 0.12 % MT SOLN
15.0000 mL | Freq: Once | OROMUCOSAL | Status: AC
  Administered 2023-12-05: 15 mL via OROMUCOSAL
  Filled 2023-12-05: qty 15

## 2023-12-05 MED ORDER — CEFAZOLIN SODIUM-DEXTROSE 2-4 GM/100ML-% IV SOLN
2.0000 g | Freq: Four times a day (QID) | INTRAVENOUS | Status: AC
  Administered 2023-12-06: 2 g via INTRAVENOUS
  Filled 2023-12-05: qty 100

## 2023-12-05 MED ORDER — APIXABAN 2.5 MG PO TABS
2.5000 mg | ORAL_TABLET | Freq: Two times a day (BID) | ORAL | 0 refills | Status: DC
  Filled 2023-12-05: qty 60, 30d supply, fill #0

## 2023-12-05 MED ORDER — PROPOFOL 10 MG/ML IV BOLUS
INTRAVENOUS | Status: DC | PRN
  Administered 2023-12-05: 20 mg via INTRAVENOUS
  Administered 2023-12-05: 10 mg via INTRAVENOUS

## 2023-12-05 MED ORDER — HYDROCODONE-ACETAMINOPHEN 5-325 MG PO TABS
1.0000 | ORAL_TABLET | Freq: Three times a day (TID) | ORAL | Status: DC | PRN
  Administered 2023-12-05: 2 via ORAL
  Filled 2023-12-05: qty 2

## 2023-12-05 MED ORDER — FENTANYL CITRATE (PF) 250 MCG/5ML IJ SOLN
INTRAMUSCULAR | Status: AC
  Filled 2023-12-05: qty 5

## 2023-12-05 MED ORDER — PHENYLEPHRINE HCL-NACL 20-0.9 MG/250ML-% IV SOLN
INTRAVENOUS | Status: DC | PRN
  Administered 2023-12-05: 30 ug/min via INTRAVENOUS

## 2023-12-05 MED ORDER — LACTATED RINGERS IV SOLN: INTRAVENOUS | Status: DC

## 2023-12-05 MED ORDER — HYDROCODONE-ACETAMINOPHEN 5-325 MG PO TABS
1.0000 | ORAL_TABLET | ORAL | Status: DC | PRN
  Administered 2023-12-05 – 2023-12-06 (×4): 2 via ORAL
  Filled 2023-12-05 (×3): qty 2

## 2023-12-05 MED ORDER — HYDROCODONE-ACETAMINOPHEN 7.5-325 MG PO TABS
1.0000 | ORAL_TABLET | Freq: Three times a day (TID) | ORAL | Status: DC | PRN
  Administered 2023-12-06: 2 via ORAL
  Filled 2023-12-05: qty 2

## 2023-12-05 MED ORDER — ONDANSETRON HCL 4 MG/2ML IJ SOLN
INTRAMUSCULAR | Status: DC | PRN
  Administered 2023-12-05: 4 mg via INTRAVENOUS

## 2023-12-05 MED ORDER — METHOCARBAMOL 500 MG PO TABS
500.0000 mg | ORAL_TABLET | Freq: Four times a day (QID) | ORAL | Status: DC | PRN
  Administered 2023-12-05 – 2023-12-06 (×2): 500 mg via ORAL
  Filled 2023-12-05 (×2): qty 1

## 2023-12-05 MED ORDER — HYDROMORPHONE HCL 1 MG/ML IJ SOLN
0.2500 mg | INTRAMUSCULAR | Status: DC | PRN
  Administered 2023-12-05 (×3): 0.5 mg via INTRAVENOUS

## 2023-12-05 MED ORDER — ORAL CARE MOUTH RINSE: 15.0000 mL | Freq: Once | OROMUCOSAL | Status: AC

## 2023-12-05 MED ORDER — APIXABAN 2.5 MG PO TABS
2.5000 mg | ORAL_TABLET | Freq: Two times a day (BID) | ORAL | Status: DC
  Administered 2023-12-06: 2.5 mg via ORAL
  Filled 2023-12-05 (×2): qty 1

## 2023-12-05 MED ORDER — DOCUSATE SODIUM 100 MG PO CAPS
100.0000 mg | ORAL_CAPSULE | Freq: Two times a day (BID) | ORAL | Status: DC
  Administered 2023-12-05 – 2023-12-06 (×2): 100 mg via ORAL
  Filled 2023-12-05 (×2): qty 1

## 2023-12-05 MED ORDER — ACETAMINOPHEN 325 MG PO TABS: 325.0000 mg | ORAL_TABLET | Freq: Four times a day (QID) | ORAL | Status: DC | PRN

## 2023-12-05 MED ORDER — TRANEXAMIC ACID 1000 MG/10ML IV SOLN
INTRAVENOUS | Status: DC | PRN
  Administered 2023-12-05: 2000 mg via TOPICAL

## 2023-12-05 MED ORDER — PRONTOSAN WOUND IRRIGATION OPTIME
TOPICAL | Status: DC | PRN
  Administered 2023-12-05: 1 via TOPICAL

## 2023-12-05 MED ORDER — POLYETHYLENE GLYCOL 3350 17 G PO PACK
17.0000 g | PACK | Freq: Every day | ORAL | Status: DC
  Administered 2023-12-05: 17 g via ORAL
  Filled 2023-12-05: qty 1

## 2023-12-05 MED ORDER — MENTHOL 3 MG MT LOZG: 1.0000 | LOZENGE | OROMUCOSAL | Status: DC | PRN

## 2023-12-05 MED ORDER — VANCOMYCIN HCL 1 G IV SOLR
INTRAVENOUS | Status: DC | PRN
  Administered 2023-12-05: 1000 mg via TOPICAL

## 2023-12-05 MED ORDER — HYDROMORPHONE HCL 1 MG/ML IJ SOLN
INTRAMUSCULAR | Status: AC
  Filled 2023-12-05: qty 1

## 2023-12-05 MED ORDER — ALUM & MAG HYDROXIDE-SIMETH 200-200-20 MG/5ML PO SUSP: 30.0000 mL | ORAL | Status: DC | PRN

## 2023-12-05 MED ORDER — HYDROMORPHONE HCL 1 MG/ML IJ SOLN
INTRAMUSCULAR | Status: DC | PRN
  Administered 2023-12-05: .5 mg via INTRAVENOUS

## 2023-12-05 MED ORDER — BUPIVACAINE IN DEXTROSE 0.75-8.25 % IT SOLN
INTRATHECAL | Status: DC | PRN
Start: 2023-12-05 — End: 2023-12-05
  Administered 2023-12-05: 2 mL via INTRATHECAL

## 2023-12-05 MED ORDER — TRANEXAMIC ACID-NACL 1000-0.7 MG/100ML-% IV SOLN
1000.0000 mg | INTRAVENOUS | Status: AC
  Administered 2023-12-05: 1000 mg via INTRAVENOUS
  Filled 2023-12-05: qty 100

## 2023-12-05 MED ORDER — PHENOL 1.4 % MT LIQD: 1.0000 | OROMUCOSAL | Status: DC | PRN

## 2023-12-05 MED ORDER — TRANEXAMIC ACID-NACL 1000-0.7 MG/100ML-% IV SOLN
INTRAVENOUS | Status: AC
Start: 2023-12-05 — End: 2023-12-05
  Filled 2023-12-05: qty 100

## 2023-12-05 MED ORDER — DEXAMETHASONE SODIUM PHOSPHATE 10 MG/ML IJ SOLN
10.0000 mg | Freq: Once | INTRAMUSCULAR | Status: AC
  Administered 2023-12-06: 10 mg via INTRAVENOUS
  Filled 2023-12-05: qty 1

## 2023-12-05 MED ORDER — METOCLOPRAMIDE HCL 5 MG PO TABS: 5.0000 mg | ORAL_TABLET | Freq: Three times a day (TID) | ORAL | Status: DC | PRN

## 2023-12-05 MED ORDER — BUPIVACAINE-MELOXICAM ER 400-12 MG/14ML IJ SOLN
INTRAMUSCULAR | Status: AC
  Filled 2023-12-05: qty 1

## 2023-12-05 MED ORDER — MIDAZOLAM HCL 2 MG/2ML IJ SOLN
INTRAMUSCULAR | Status: AC
  Filled 2023-12-05: qty 2

## 2023-12-05 MED ORDER — DEXAMETHASONE SODIUM PHOSPHATE 10 MG/ML IJ SOLN
INTRAMUSCULAR | Status: DC | PRN
  Administered 2023-12-05: 5 mg via INTRAVENOUS

## 2023-12-05 MED ORDER — CEFAZOLIN SODIUM-DEXTROSE 2-4 GM/100ML-% IV SOLN
2.0000 g | INTRAVENOUS | Status: AC
  Administered 2023-12-05: 2 g via INTRAVENOUS
  Filled 2023-12-05: qty 100

## 2023-12-05 MED ORDER — METOCLOPRAMIDE HCL 5 MG/ML IJ SOLN: 5.0000 mg | Freq: Three times a day (TID) | INTRAMUSCULAR | Status: DC | PRN

## 2023-12-05 MED ORDER — HYDROMORPHONE HCL 1 MG/ML IJ SOLN
INTRAMUSCULAR | Status: AC
  Filled 2023-12-05: qty 0.5

## 2023-12-05 MED ORDER — MAGNESIUM CITRATE PO SOLN
1.0000 | Freq: Once | ORAL | Status: DC | PRN
  Filled 2023-12-05: qty 296

## 2023-12-05 MED ORDER — PROPOFOL 500 MG/50ML IV EMUL
INTRAVENOUS | Status: DC | PRN
  Administered 2023-12-05: 120 ug/kg/min via INTRAVENOUS

## 2023-12-05 MED ORDER — POVIDONE-IODINE 10 % EX SWAB: 2.0000 | Freq: Once | CUTANEOUS | Status: DC

## 2023-12-05 MED ORDER — OXYCODONE HCL 5 MG PO TABS: 5.0000 mg | ORAL_TABLET | Freq: Once | ORAL | Status: DC | PRN

## 2023-12-05 MED ORDER — 0.9 % SODIUM CHLORIDE (POUR BTL) OPTIME
TOPICAL | Status: DC | PRN
  Administered 2023-12-05: 1000 mL

## 2023-12-05 MED ORDER — QUETIAPINE FUMARATE 300 MG PO TABS
300.0000 mg | ORAL_TABLET | Freq: Every day | ORAL | Status: DC
  Administered 2023-12-05: 300 mg via ORAL
  Filled 2023-12-05 (×2): qty 1

## 2023-12-05 MED ORDER — ONDANSETRON HCL 4 MG/2ML IJ SOLN
INTRAMUSCULAR | Status: AC
  Filled 2023-12-05: qty 2

## 2023-12-05 MED ORDER — FENTANYL CITRATE (PF) 250 MCG/5ML IJ SOLN
INTRAMUSCULAR | Status: DC | PRN
  Administered 2023-12-05 (×2): 50 ug via INTRAVENOUS
  Administered 2023-12-05: 100 ug via INTRAVENOUS
  Administered 2023-12-05: 50 ug via INTRAVENOUS

## 2023-12-05 MED ORDER — PANTOPRAZOLE SODIUM 40 MG PO TBEC
40.0000 mg | DELAYED_RELEASE_TABLET | Freq: Every day | ORAL | Status: DC
  Administered 2023-12-05 – 2023-12-06 (×2): 40 mg via ORAL
  Filled 2023-12-05 (×2): qty 1

## 2023-12-05 MED ORDER — VANCOMYCIN HCL 1000 MG IV SOLR
INTRAVENOUS | Status: AC
  Filled 2023-12-05: qty 20

## 2023-12-05 MED ORDER — CEFAZOLIN SODIUM-DEXTROSE 2-4 GM/100ML-% IV SOLN
INTRAVENOUS | Status: AC
Start: 2023-12-05 — End: 2023-12-05
  Filled 2023-12-05: qty 100

## 2023-12-05 MED ORDER — ACETAMINOPHEN 500 MG PO TABS
1000.0000 mg | ORAL_TABLET | Freq: Four times a day (QID) | ORAL | Status: DC
  Administered 2023-12-05: 1000 mg via ORAL
  Filled 2023-12-05: qty 2

## 2023-12-05 MED ORDER — SORBITOL 70 % SOLN: 30.0000 mL | Freq: Every day | Status: DC | PRN

## 2023-12-05 MED ORDER — OXYCODONE HCL 5 MG/5ML PO SOLN: 5.0000 mg | Freq: Once | ORAL | Status: DC | PRN

## 2023-12-05 MED ORDER — METHOCARBAMOL 1000 MG/10ML IJ SOLN: 500.0000 mg | Freq: Four times a day (QID) | INTRAMUSCULAR | Status: DC | PRN

## 2023-12-05 MED ORDER — MIDAZOLAM HCL 2 MG/2ML IJ SOLN
INTRAMUSCULAR | Status: DC | PRN
  Administered 2023-12-05: 2 mg via INTRAVENOUS

## 2023-12-05 MED ORDER — SODIUM CHLORIDE 0.9 % IR SOLN
Status: DC | PRN
  Administered 2023-12-05: 1000 mL

## 2023-12-05 MED ORDER — SODIUM CHLORIDE 0.9 % IV SOLN: INTRAVENOUS | Status: DC

## 2023-12-05 MED ORDER — DEXAMETHASONE SODIUM PHOSPHATE 10 MG/ML IJ SOLN
INTRAMUSCULAR | Status: AC
  Filled 2023-12-05: qty 1

## 2023-12-05 MED ORDER — HYDROCODONE-ACETAMINOPHEN 5-325 MG PO TABS
ORAL_TABLET | ORAL | Status: AC
  Filled 2023-12-05: qty 2

## 2023-12-05 MED ORDER — LEVOTHYROXINE SODIUM 50 MCG PO TABS
50.0000 ug | ORAL_TABLET | Freq: Every day | ORAL | Status: DC
  Administered 2023-12-06: 50 ug via ORAL
  Filled 2023-12-05: qty 2
  Filled 2023-12-05: qty 1

## 2023-12-05 MED ORDER — DIPHENHYDRAMINE HCL 12.5 MG/5ML PO ELIX: 25.0000 mg | ORAL_SOLUTION | ORAL | Status: DC | PRN

## 2023-12-05 MED ORDER — MORPHINE SULFATE (PF) 2 MG/ML IV SOLN: 0.5000 mg | Freq: Four times a day (QID) | INTRAVENOUS | Status: DC | PRN

## 2023-12-05 MED ORDER — BUPIVACAINE-MELOXICAM ER 400-12 MG/14ML IJ SOLN
INTRAMUSCULAR | Status: DC | PRN
  Administered 2023-12-05: 400 mg

## 2023-12-05 MED ORDER — TRANEXAMIC ACID-NACL 1000-0.7 MG/100ML-% IV SOLN
1000.0000 mg | Freq: Once | INTRAVENOUS | Status: AC
  Administered 2023-12-05: 1000 mg via INTRAVENOUS

## 2023-12-05 SURGICAL SUPPLY — 49 items
BAG COUNTER SPONGE SURGICOUNT (BAG) ×1 IMPLANT
BAG DECANTER FOR FLEXI CONT (MISCELLANEOUS) ×1 IMPLANT
BLADE SAG 18X100X1.27 (BLADE) ×1 IMPLANT
COVER PERINEAL POST (MISCELLANEOUS) ×1 IMPLANT
COVER SURGICAL LIGHT HANDLE (MISCELLANEOUS) ×1 IMPLANT
DERMABOND ADVANCED .7 DNX12 (GAUZE/BANDAGES/DRESSINGS) IMPLANT
DRAPE C-ARM 42X72 X-RAY (DRAPES) ×1 IMPLANT
DRAPE POUCH INSTRU U-SHP 10X18 (DRAPES) ×1 IMPLANT
DRAPE STERI IOBAN 125X83 (DRAPES) ×1 IMPLANT
DRAPE U-SHAPE 47X51 STRL (DRAPES) ×2 IMPLANT
DRSG AQUACEL AG ADV 3.5X10 (GAUZE/BANDAGES/DRESSINGS) ×1 IMPLANT
DURAPREP 26ML APPLICATOR (WOUND CARE) ×2 IMPLANT
ELECTRODE BLDE 4.0 EZ CLN MEGD (MISCELLANEOUS) ×1 IMPLANT
ELECTRODE REM PT RTRN 9FT ADLT (ELECTROSURGICAL) ×1 IMPLANT
GLOVE BIOGEL PI IND STRL 7.0 (GLOVE) ×2 IMPLANT
GLOVE BIOGEL PI IND STRL 7.5 (GLOVE) ×1 IMPLANT
GLOVE ECLIPSE 7.0 STRL STRAW (GLOVE) ×5 IMPLANT
GLOVE INDICATOR 7.0 STRL GRN (GLOVE) ×1 IMPLANT
GLOVE SURG SYN 7.5 PF PI (GLOVE) ×5 IMPLANT
GOWN STRL REUS W/ TWL LRG LVL3 (GOWN DISPOSABLE) IMPLANT
GOWN STRL REUS W/ TWL XL LVL3 (GOWN DISPOSABLE) ×1 IMPLANT
GOWN STRL SURGICAL XL XLNG (GOWN DISPOSABLE) ×1 IMPLANT
GOWN TOGA ZIPPER T7+ PEEL AWAY (MISCELLANEOUS) ×1 IMPLANT
HEAD CERAMIC 36 PLUS5 (Hips) IMPLANT
HOOD PEEL AWAY T7 (MISCELLANEOUS) ×1 IMPLANT
IV 0.9% NACL 1000 ML (IV SOLUTION) ×1 IMPLANT
KIT BASIN OR (CUSTOM PROCEDURE TRAY) ×1 IMPLANT
LINER NEUTRAL 52X36MM PLUS 4 (Liner) IMPLANT
MARKER SKIN DUAL TIP RULER LAB (MISCELLANEOUS) ×1 IMPLANT
NDL SPNL 18GX3.5 QUINCKE PK (NEEDLE) ×1 IMPLANT
NEEDLE SPNL 18GX3.5 QUINCKE PK (NEEDLE) ×1 IMPLANT
PACK TOTAL JOINT (CUSTOM PROCEDURE TRAY) ×1 IMPLANT
PACK UNIVERSAL I (CUSTOM PROCEDURE TRAY) ×1 IMPLANT
PIN SECTOR W/GRIP ACE CUP 52MM (Hips) IMPLANT
SET HNDPC FAN SPRY TIP SCT (DISPOSABLE) ×1 IMPLANT
SOLUTION PRONTOSAN WOUND 350ML (IRRIGATION / IRRIGATOR) ×1 IMPLANT
STEM FEM SZ3 STD ACTIS (Stem) IMPLANT
SUT ETHIBOND 2 V 37 (SUTURE) ×1 IMPLANT
SUT ETHILON 2 0 FS 18 (SUTURE) IMPLANT
SUT STRATAFIX PDS+ 0 24IN (SUTURE) IMPLANT
SUT VIC AB 0 CT1 27XBRD ANBCTR (SUTURE) ×1 IMPLANT
SUT VIC AB 1 CTX36XBRD ANBCTR (SUTURE) ×1 IMPLANT
SUT VIC AB 2-0 CT1 TAPERPNT 27 (SUTURE) ×2 IMPLANT
SYR 30ML LL (SYRINGE) ×2 IMPLANT
TOWEL GREEN STERILE (TOWEL DISPOSABLE) ×1 IMPLANT
TRAY CATH INTERMITTENT SS 16FR (CATHETERS) IMPLANT
TRAY FOLEY W/BAG SLVR 16FR ST (SET/KITS/TRAYS/PACK) IMPLANT
TUBE SUCT ARGYLE STRL (TUBING) ×1 IMPLANT
YANKAUER SUCT BULB TIP NO VENT (SUCTIONS) ×1 IMPLANT

## 2023-12-05 NOTE — Anesthesia Procedure Notes (Signed)
 Spinal  Patient location during procedure: OR Start time: 12/05/2023 11:55 AM End time: 12/05/2023 12:00 PM Reason for block: surgical anesthesia Staffing Performed: anesthesiologist  Anesthesiologist: Cleotilde Butler Dade, MD Performed by: Cleotilde Butler Dade, MD Authorized by: Cleotilde Butler Dade, MD   Preanesthetic Checklist Completed: patient identified, IV checked, site marked, risks and benefits discussed, surgical consent, monitors and equipment checked, pre-op evaluation and timeout performed Spinal Block Patient position: sitting Prep: DuraPrep Patient monitoring: heart rate, cardiac monitor, continuous pulse ox and blood pressure Approach: midline Location: L3-4 Injection technique: single-shot Needle Needle type: Sprotte  Needle gauge: 24 G Needle length: 9 cm Assessment Sensory level: T4 Events: CSF return

## 2023-12-05 NOTE — Transfer of Care (Signed)
 Immediate Anesthesia Transfer of Care Note  Patient: Denise Morgan  Procedure(s) Performed: LEFT HIP ARTHROPLASTY, ANTERIOR APPROACH (Left: Hip)  Patient Location: PACU  Anesthesia Type:MAC and Spinal  Level of Consciousness: awake and alert   Airway & Oxygen Therapy: Patient Spontanous Breathing  Post-op Assessment: Report given to RN and Post -op Vital signs reviewed and stable  Post vital signs: Reviewed and stable  Last Vitals:  Vitals Value Taken Time  BP 112/75 12/05/23 13:52  Temp    Pulse 52 12/05/23 13:55  Resp 22 12/05/23 13:55  SpO2 92 % 12/05/23 13:55  Vitals shown include unfiled device data.  Last Pain:  Vitals:   12/05/23 0938  TempSrc:   PainSc: 6       Patients Stated Pain Goal: 2 (12/05/23 9061)  Complications: No notable events documented.

## 2023-12-05 NOTE — Anesthesia Postprocedure Evaluation (Signed)
 Anesthesia Post Note  Patient: Denise Morgan  Procedure(s) Performed: LEFT HIP ARTHROPLASTY, ANTERIOR APPROACH (Left: Hip)     Patient location during evaluation: PACU Anesthesia Type: MAC Level of consciousness: awake and alert Pain management: pain level controlled Vital Signs Assessment: post-procedure vital signs reviewed and stable Respiratory status: spontaneous breathing, nonlabored ventilation and respiratory function stable Cardiovascular status: blood pressure returned to baseline and stable Postop Assessment: no apparent nausea or vomiting Anesthetic complications: no   No notable events documented.  Last Vitals:  Vitals:   12/05/23 1500 12/05/23 1515  BP: 129/68 134/76  Pulse: 65 (!) 59  Resp: 16 14  Temp:    SpO2: 95% 94%    Last Pain:  Vitals:   12/05/23 1515  TempSrc:   PainSc: 4                  Butler Levander Pinal

## 2023-12-05 NOTE — Anesthesia Preprocedure Evaluation (Addendum)
 Anesthesia Evaluation  Patient identified by MRN, date of birth, ID band Patient awake    Reviewed: Allergy & Precautions, H&P , NPO status , Patient's Chart, lab work & pertinent test results  History of Anesthesia Complications (+) Family history of anesthesia reaction  Airway Mallampati: II  TM Distance: >3 FB Neck ROM: Full    Dental no notable dental hx. (+) Teeth Intact, Dental Advisory Given   Pulmonary asthma , Current Smoker and Patient abstained from smoking.   Pulmonary exam normal breath sounds clear to auscultation       Cardiovascular Exercise Tolerance: Good hypertension, Pt. on medications Normal cardiovascular exam+ dysrhythmias  Rhythm:Regular Rate:Normal     Neuro/Psych  Headaches PSYCHIATRIC DISORDERS  Depression    negative neurological ROS  negative psych ROS   GI/Hepatic negative GI ROS, Neg liver ROS,,,  Endo/Other  negative endocrine ROSHypothyroidism    Renal/GU negative Renal ROS  negative genitourinary   Musculoskeletal negative musculoskeletal ROS (+) Arthritis ,    Abdominal  (+) + obese  Peds negative pediatric ROS (+)  Hematology negative hematology ROS (+) Blood dyscrasia, anemia   Anesthesia Other Findings   Reproductive/Obstetrics negative OB ROS                              Anesthesia Physical Anesthesia Plan  ASA: 3  Anesthesia Plan: Spinal and MAC   Post-op Pain Management:    Induction: Intravenous  PONV Risk Score and Plan: 1 and Propofol  infusion and Treatment may vary due to age or medical condition  Airway Management Planned: Natural Airway, Simple Face Mask, Nasal Cannula and Mask  Additional Equipment: None  Intra-op Plan:   Post-operative Plan:   Informed Consent: I have reviewed the patients History and Physical, chart, labs and discussed the procedure including the risks, benefits and alternatives for the proposed anesthesia  with the patient or authorized representative who has indicated his/her understanding and acceptance.       Plan Discussed with: Anesthesiologist and CRNA  Anesthesia Plan Comments: (  )         Anesthesia Quick Evaluation

## 2023-12-05 NOTE — Plan of Care (Signed)

## 2023-12-05 NOTE — H&P (Signed)
 PREOPERATIVE H&P  Chief Complaint: left hip osteoarthritis  HPI: Denise Morgan is a 58 y.o. female who presents for surgical treatment of left hip osteoarthritis.  She denies any changes in medical history.  Past Surgical History:  Procedure Laterality Date   ABDOMINAL HYSTERECTOMY  03/01/2004   CHOLECYSTECTOMY     COLONOSCOPY W/ POLYPECTOMY  07/2020   TOTAL HIP ARTHROPLASTY Right 02/21/2023   Procedure: RIGHT TOTAL HIP ARTHROPLASTY ANTERIOR APPROACH;  Surgeon: Jerri Kay HERO, MD;  Location: [facility] OR;  Service: Orthopedics;  Laterality: Right;  3-C   Social History   Socioeconomic History   Marital status: Single    Spouse name: Not on file   Number of children: Not on file   Years of education: Not on file   Highest education level: Not on file  Occupational History   Not on file  Tobacco Use   Smoking status: Every Day    Current packs/day: 0.75    Average packs/day: 0.8 packs/day for 33.0 years (24.8 ttl pk-yrs)    Types: Cigarettes    Passive exposure: Current   Smokeless tobacco: Never  Vaping Use   Vaping status: Former  Substance and Sexual Activity   Alcohol use: Yes    Comment: 2-3x/week - wine   Drug use: No   Sexual activity: Not Currently    Birth control/protection: Surgical  Other Topics Concern   Not on file  Social History Narrative   Not on file   Social Drivers of Health   Financial Resource Strain: Not on file  Food Insecurity: Not on file  Transportation Needs: Not on file  Physical Activity: Not on file  Stress: Not on file  Social Connections: Not on file   Family History  Problem Relation Age of Onset   Hypertension Mother    Colon polyps Mother    Ovarian cancer Mother        had , no longer   Arthritis Mother    Hypertension Father    Asthma Father    Hypertension Brother    Colon cancer Maternal Aunt    Esophageal cancer Neg Hx    Rectal cancer Neg Hx    Stomach cancer Neg Hx    Allergies  Allergen Reactions    Latex Itching and Rash   Prior to Admission medications   Medication Sig Start Date End Date Taking? Authorizing Provider  Albuterol -Budesonide  (AIRSUPRA ) 90-80 MCG/ACT AERO Inhale 2 puffs into the lungs 3 (three) times daily as needed (shortness of breath).   Yes [provider]  apixaban (ELIQUIS) 2.5 MG TABS tablet Take 1 tablet (2.5 mg total) by mouth 2 (two) times daily for 30 days post-op to prevent blood clots 12/05/23   Jule Ronal CROME, PA-C  HYDROcodone -acetaminophen  (NORCO/VICODIN) 5-325 MG tablet Take 1 tablet by mouth 3 (three) times daily as needed. To be taken after surgery for breakthrough pain 12/04/23   Jule Ronal CROME, PA-C  levothyroxine  (SYNTHROID ) 50 MCG tablet TAKE 1 TABLET BY MOUTH EVERY DAY BEFORE BREAKFAST 08/29/23  Yes Joshua Debby CROME, MD  meloxicam  (MOBIC ) 15 MG tablet Take 1 tablet (15 mg total) by mouth daily. 11/24/23  Yes Joshua Debby CROME, MD  naloxone  (NARCAN ) nasal spray 4 mg/0.1 mL Place 1 spray into the nose as needed (OD). 08/22/23  Yes [provider]  nebivolol  (BYSTOLIC ) 5 MG tablet Take 1 tablet (5 mg total) by mouth daily. 10/26/23  Yes Joshua Debby CROME, MD  Oxycodone  HCl 10 MG TABS  Take 1 tablet (10 mg total) by mouth every 8 (eight) hours as needed. 11/26/23  Yes Joshua Debby CROME, MD  potassium chloride  (KLOR-CON ) 8 MEQ tablet TAKE 1 TABLET (8 MEQ TOTAL) BY MOUTH 3 (THREE) TIMES DAILY. 05/03/23  Yes Joshua Debby CROME, MD  QUEtiapine  (SEROQUEL ) 300 MG tablet Take 1 tablet (300 mg total) by mouth at bedtime. 11/26/23  Yes Joshua Debby CROME, MD  rosuvastatin  (CRESTOR ) 20 MG tablet Take 1 tablet (20 mg total) by mouth daily. 11/24/23  Yes Joshua Debby CROME, MD  methocarbamol  (ROBAXIN ) 750 MG tablet Take 1 tablet (750 mg total) by mouth 3 (three) times daily as needed. 11/22/23   Jule Ronal CROME, PA-C     Positive ROS: All other systems have been reviewed and were otherwise negative with the exception of those mentioned in the HPI and as above.  Physical  Exam: General: Alert, no acute distress Cardiovascular: No pedal edema Respiratory: No cyanosis, no use of accessory musculature GI: abdomen soft Skin: No lesions in the area of chief complaint Neurologic: Sensation intact distally Psychiatric: Patient is competent for consent with normal mood and affect Lymphatic: no lymphedema  MUSCULOSKELETAL: exam stable  Assessment: left hip osteoarthritis  Plan: Plan for Procedure(s): ARTHROPLASTY, HIP, TOTAL, ANTERIOR APPROACH  The risks benefits and alternatives were discussed with the patient including but not limited to the risks of nonoperative treatment, versus surgical intervention including infection, bleeding, nerve injury,  blood clots, cardiopulmonary complications, morbidity, mortality, among others, and they were willing to proceed.   Ozell Cummins, MD 12/05/2023 8:59 AM

## 2023-12-05 NOTE — Op Note (Signed)
 LEFT HIP ARTHROPLASTY, ANTERIOR APPROACH  Procedure Note Denise Morgan   [id number]  Pre-op Diagnosis: left hip osteoarthritis     Post-op Diagnosis: same  Operative Findings Severe OA   Operative Procedures  1. Total hip replacement; Left hip; uncemented [id number]   Surgeon: Kay Cummins, M.D.  Assist: Ronal Morna Grave, PA-C   Anesthesia: spinal  Prosthesis: Depuy Acetabulum: Pinnacle 52 mm Femur: Actis 3 STD Head: 36 mm size: +5 Liner: +4 neutral Bearing Type: ceramic/poly  Total Hip Arthroplasty (Anterior Approach) Op Note:  After informed consent was obtained and the operative extremity marked in the holding area, the patient was brought back to the operating room and placed supine on the HANA table. Next, the operative extremity was prepped and draped in normal sterile fashion. Surgical timeout occurred verifying patient identification, surgical site, surgical procedure and administration of antibiotics.  A 10 cm longitudinal incision was made starting from 3 fingerbreadths lateral and 1 inferior to the ASIS towards the lateral aspect of the patella.  A Hueter approach to the hip was performed, using the interval between tensor fascia lata and sartorius.  Dissection was carried bluntly down onto the anterior hip capsule. The lateral femoral circumflex vessels were identified and coagulated. A capsulotomy was performed and the capsular flaps tagged for later repair.  The neck osteotomy was performed 1 fingerbreadth above the lesser trochanter. The femoral head was removed which showed severe wear, the acetabular rim was cleared of soft tissue and osteophytes and attention was turned to reaming the acetabulum.  Sequential reaming was performed under fluoroscopic guidance down to the floor of the cotyloid fossa. We reamed to a size 52 mm, and then impacted the acetabular shell.   A +4 neutral liner was then placed after irrigation and attention turned to the femur.  After  placing the femoral hook, the leg was taken to externally rotated, extended and adducted position taking care to perform soft tissue releases to allow for adequate mobilization of the femur. Soft tissue was cleared from the shoulder of the greater trochanter and the hook elevator used to improve exposure of the proximal femur.  Lateral bone from the shoulder was rasped away for relief.  Sequential broaching performed up to a size 3.  Standard trial neck and +1.5 head were placed. The leg was brought back up to neutral and the construct reduced.  The position and sizing of components, offset and leg lengths were checked using fluoroscopy.  Based on fluoroscopic findings, we chose to retrial with standard neck and +5 head ball which best restored her offset and leg length.  Stability of the construct was checked in 45 degrees of hip extension and 90 degrees of external rotation without any subluxation, shuck or impingement of prosthesis. We dislocated the prosthesis, dropped the leg back into position, removed trial components, and irrigated copiously. The final stem and head were chosen then placed, the leg brought back up, the system reduced and fluoroscopy used to verify positioning.  Antibiotic irrigation was placed in the surgical wound.   We irrigated, obtained hemostasis and closed the capsule using #2 ethibond suture.  A topical mixture of 0.25% bupivacaine  and meloxicam  was placed deep to the fascia.  One gram of vancomycin  powder was placed in the surgical bed.   One gram of topical tranexamic acid  was injected into the joint.  The fascia was closed with #1 stratafix, the deep fat layer was closed with 0 vicryl, the subcutaneous layers closed with 2.0 Vicryl Plus  and the skin closed with 2.0 nylon and dermabond. A sterile dressing was applied. The patient was awakened in the operating room and taken to recovery in stable condition.  All sponge, needle, and instrument counts were correct at the end of the  case.   Morna Grave, my PA, was a medical necessity for opening, closing, limb positioning, retracting, exposing, and overall facilitation and timely completion of the surgery.  Position: supine  Complications: see description of procedure.  Time Out: performed   Drains/Packing: none  Estimated blood loss: see anesthesia record  Returned to Recovery Room: in good condition.   Antibiotics: yes   Mechanical VTE (DVT) Prophylaxis: sequential compression devices, TED thigh-high  Chemical VTE (DVT) Prophylaxis: eliquis    Fluid Replacement: see anesthesia record  Specimens Removed: 1 to pathology   Sponge and Instrument Count Correct? yes   PACU: portable radiograph - low AP   Plan/RTC: Return in 2 weeks for suture removal. Weight Bearing/Load Lower Extremity: full  Hip precautions: none Suture Removal: 2 weeks   N. Ozell Cummins, MD Denise Morgan 1:17 PM   Implant Name Type Inv. Item Serial No. Manufacturer Lot No. LRB No. Used Action  PIN SECTOR W/GRIP ACE CUP - [id number] Hips PIN SECTOR W/GRIP ACE CUP  [employer] [id number] Left 1 Implanted  LINER NEUTRAL [id number] PLUS 4 - [id number] Liner LINER NEUTRAL [id number] PLUS 4  DEPU[employer] [id number] Left 1 Implanted  STEM FEM SZ3 STD ACTIS - [id number] Stem STEM FEM SZ3 STD ACTIS  [employer] [id number] Left 1 Implanted  HEAD CERAMIC 36 PLUS5 - [id number] Hips HEAD CERAMIC 36 PLUS5  [employer] [id number] Left 1 Implanted

## 2023-12-05 NOTE — Telephone Encounter (Signed)
 [facility] was unable to take patient for HHPT. She had a THA today. D you want to pursue another company for HHPT or just do walking at home?

## 2023-12-05 NOTE — Discharge Instructions (Signed)

## 2023-12-06 ENCOUNTER — Encounter (HOSPITAL_COMMUNITY): Payer: Self-pay | Admitting: Orthopaedic Surgery

## 2023-12-06 DIAGNOSIS — M1612 Unilateral primary osteoarthritis, left hip: Secondary | ICD-10-CM | POA: Diagnosis not present

## 2023-12-06 NOTE — Evaluation (Signed)
 Physical Therapy Evaluation  Patient Details Name: Denise Morgan MRN: [id number] DOB: Dec 17, 1965 Today's Date: 12/06/2023  History of Present Illness  Pt is a 58 y/o female who presents 12/05/2023 for direct anterior L THA. PMH significant for Asthma, HTN, hpothyroidism, PE, R THA 2024.  Clinical Impression  Pt admitted with above diagnosis. Pt currently with functional limitations due to the deficits listed below (see PT Problem List). At the time of PT eval pt was able to perform transfers and ambulation with gross CGA to min assist and RW for support. Pt limited by pain. Pt will benefit from acute skilled PT to increase their independence and safety with mobility to allow discharge.         If plan is discharge home, recommend the following: A little help with walking and/or transfers;A little help with bathing/dressing/bathroom;Assistance with cooking/housework;Assist for transportation;Help with stairs or ramp for entrance   Can travel by private vehicle        Equipment Recommendations None recommended by PT  Recommendations for Other Services       Functional Status Assessment Patient has had a recent decline in their functional status and demonstrates the ability to make significant improvements in function in a reasonable and predictable amount of time.     Precautions / Restrictions Precautions Precautions: Fall Recall of Precautions/Restrictions: Intact Precaution/Restrictions Comments: Direct anterior approach, no precautions. Restrictions Weight Bearing Restrictions Per Provider Order: Yes LLE Weight Bearing Per Provider Order: Weight bearing as tolerated      Mobility  Bed Mobility Overal bed mobility: Needs Assistance Bed Mobility: Supine to Sit     Supine to sit: Min assist     General bed mobility comments: Assist for LLE movement towards EOB. Increased time and effort required.    Transfers Overall transfer level: Needs assistance Equipment used:  Rolling walker (2 wheels) Transfers: Sit to/from Stand Sit to Stand: Contact guard assist           General transfer comment: Close guard for safety as pt powered up to full stand.    Ambulation/Gait Ambulation/Gait assistance: Contact guard assist Gait Distance (Feet): 75 Feet Assistive device: Rolling walker (2 wheels) Gait Pattern/deviations: Step-through pattern, Decreased stride length, Trunk flexed Gait velocity: Decreased Gait velocity interpretation: <1.31 ft/sec, indicative of household ambulator   General Gait Details: VC's for improved posture, closer walker proximity and forward gaze. Pt with slow, effortful steps. Pt reports improved pain.  Stairs            Wheelchair Mobility     Tilt Bed    Modified Rankin (Stroke Patients Only)       Balance                                             Pertinent Vitals/Pain Pain Assessment Pain Assessment: Faces Faces Pain Scale: Hurts whole lot Pain Location: L hip Pain Descriptors / Indicators: Operative site guarding, Sore, Grimacing, Aching Pain Intervention(s): Limited activity within patient's tolerance, Monitored during session, Repositioned    Home Living Family/patient expects to be discharged to:: Private residence Living Arrangements: Parent;Spouse/significant other Available Help at Discharge: Family;Available 24 hours/day Type of Home: House Home Access: Stairs to enter   [employer] of Steps: 1 very small   Home Layout: One level Home Equipment: Agricultural consultant (2 wheels);Cane - single point;Shower seat      Prior Function  Prior Level of Function : Independent/Modified Independent;Working/employed                     Extremity/Trunk Assessment   Upper Extremity Assessment Upper Extremity Assessment: Overall WFL for tasks assessed    Lower Extremity Assessment Lower Extremity Assessment: LLE deficits/detail LLE Deficits / Details: Decreased  strength and AROM consistent with above mentioned surgery.    Cervical / Trunk Assessment Cervical / Trunk Assessment: Normal  Communication   Communication Communication: No apparent difficulties    Cognition Arousal: Alert Behavior During Therapy: WFL for tasks assessed/performed   PT - Cognitive impairments: No apparent impairments                         Following commands: Intact       Cueing Cueing Techniques: Verbal cues, Gestural cues     General Comments      Exercises Total Joint Exercises Ankle Circles/Pumps: 10 reps Quad Sets: 10 reps Short Arc Quad: 10 reps Heel Slides: 10 reps Hip ABduction/ADduction: 10 reps Long Arc Quad: 5 reps   Assessment/Plan    PT Assessment Patient needs continued PT services  PT Problem List Decreased strength;Decreased range of motion;Decreased activity tolerance;Decreased balance;Decreased mobility;Decreased knowledge of use of DME;Decreased safety awareness;Decreased knowledge of precautions;Pain       PT Treatment Interventions DME instruction;Gait training;Stair training;Functional mobility training;Therapeutic activities;Therapeutic exercise;Balance training;Patient/family education    PT Goals (Current goals can be found in the Care Plan section)  Acute Rehab PT Goals Patient Stated Goal: home at d/c PT Goal Formulation: With patient Time For Goal Achievement: 12/13/23 Potential to Achieve Goals: Good    Frequency 7X/week     Co-evaluation               AM-PAC PT 6 Clicks Mobility  Outcome Measure Help needed turning from your back to your side while in a flat bed without using bedrails?: A Little Help needed moving from lying on your back to sitting on the side of a flat bed without using bedrails?: A Little Help needed moving to and from a bed to a chair (including a wheelchair)?: A Little Help needed standing up from a chair using your arms (e.g., wheelchair or bedside chair)?: A  Little Help needed to walk in hospital room?: A Little Help needed climbing 3-5 steps with a railing? : A Little 6 Click Score: 18    End of Session Equipment Utilized During Treatment: Gait belt Activity Tolerance: Patient limited by pain Patient left: in chair;with call bell/phone within reach Nurse Communication: Mobility status PT Visit Diagnosis: Unsteadiness on feet (R26.81);Pain Pain - Right/Left: Left Pain - part of body: Hip    Time: 9056-8979 PT Time Calculation (min) (ACUTE ONLY): 37 min   Charges:   PT Evaluation $PT Eval Moderate Complexity: 1 Mod PT Treatments $Gait Training: 8-22 mins PT General Charges $$ ACUTE PT VISIT: 1 Visit         Leita Sable, PT, DPT Acute Rehabilitation Services Secure Chat Preferred Office: [phone]   Leita JONETTA Sable 12/06/2023, 11:27 AM

## 2023-12-06 NOTE — Progress Notes (Signed)
 Physical Therapy Treatment  Patient Details Name: Denise Morgan MRN: [id number] DOB: [birth year] Today's Date: 12/06/2023   History of Present Illness Pt is a 58 y/o female who presents 12/05/2023 for direct anterior L THA. PMH significant for Asthma, HTN, hpothyroidism, PE, R THA 2024.    PT Comments  Pt seen for second PT session to prepare for d/c home this afternoon. Overall pt reports pain this afternoon but was able to demonstrate improved active movement during HEP review and ambulation with RW for support. Pt was able to progress ambulation distance during session as well. Reviewed HEP, car transfer, positioning recommendations, and appropriate activity progression.    If plan is discharge home, recommend the following: A little help with walking and/or transfers;A little help with bathing/dressing/bathroom;Assistance with cooking/housework;Assist for transportation;Help with stairs or ramp for entrance   Can travel by private vehicle        Equipment Recommendations  None recommended by PT    Recommendations for Other Services       Precautions / Restrictions Precautions Precautions: Fall Recall of Precautions/Restrictions: Intact Precaution/Restrictions Comments: Direct anterior approach, no precautions. Restrictions Weight Bearing Restrictions Per Provider Order: Yes LLE Weight Bearing Per Provider Order: Weight bearing as tolerated     Mobility  Bed Mobility Overal bed mobility: Needs Assistance Bed Mobility: Sit to Supine     Supine to sit: Min assist Sit to supine: Min assist   General bed mobility comments: Assist for LLE elevation up into bed at end of session. Increased time and effort required.    Transfers Overall transfer level: Needs assistance Equipment used: Rolling walker (2 wheels) Transfers: Sit to/from Stand Sit to Stand: Contact guard assist           General transfer comment: Close guard for safety as pt powered up to full stand.     Ambulation/Gait Ambulation/Gait assistance: Contact guard assist Gait Distance (Feet): 100 Feet Assistive device: Rolling walker (2 wheels) Gait Pattern/deviations: Step-through pattern, Decreased stride length, Trunk flexed Gait velocity: Decreased Gait velocity interpretation: <1.31 ft/sec, indicative of household ambulator   General Gait Details: VC's for improved posture, closer walker proximity and forward gaze. Pt with slow, effortful steps. Pt reports improved pain.   Stairs             Wheelchair Mobility     Tilt Bed    Modified Rankin (Stroke Patients Only)       Balance Overall balance assessment: Needs assistance Sitting-balance support: Feet supported, No upper extremity supported Sitting balance-Leahy Scale: Fair                                      Hotel manager: No apparent difficulties  Cognition Arousal: Alert Behavior During Therapy: WFL for tasks assessed/performed   PT - Cognitive impairments: No apparent impairments                         Following commands: Intact      Cueing Cueing Techniques: Verbal cues, Gestural cues  Exercises Total Joint Exercises Ankle Circles/Pumps: 10 reps Quad Sets: 10 reps Short Arc Quad: 10 reps Heel Slides: 5 reps Hip ABduction/ADduction: 5 reps Long Arc Quad: 10 reps    General Comments        Pertinent Vitals/Pain Pain Assessment Pain Assessment: Faces Faces Pain Scale: Hurts whole lot Pain Location: L hip Pain Descriptors /  Indicators: Operative site guarding, Sore, Grimacing, Aching Pain Intervention(s): Limited activity within patient's tolerance, Monitored during session, Repositioned    Home Living                          Prior Function            PT Goals (current goals can now be found in the care plan section) Acute Rehab PT Goals Patient Stated Goal: home at d/c PT Goal Formulation: With patient Time For  Goal Achievement: 12/13/23 Potential to Achieve Goals: Good Progress towards PT goals: Progressing toward goals    Frequency    7X/week      PT Plan      Co-evaluation              AM-PAC PT 6 Clicks Mobility   Outcome Measure  Help needed turning from your back to your side while in a flat bed without using bedrails?: A Little Help needed moving from lying on your back to sitting on the side of a flat bed without using bedrails?: A Little Help needed moving to and from a bed to a chair (including a wheelchair)?: A Little Help needed standing up from a chair using your arms (e.g., wheelchair or bedside chair)?: A Little Help needed to walk in hospital room?: A Little Help needed climbing 3-5 steps with a railing? : A Little 6 Click Score: 18    End of Session Equipment Utilized During Treatment: Gait belt Activity Tolerance: Patient limited by pain Patient left: in chair;with call bell/phone within reach Nurse Communication: Mobility status PT Visit Diagnosis: Unsteadiness on feet (R26.81);Pain Pain - Right/Left: Left Pain - part of body: Hip     Time: 8597-8574 PT Time Calculation (min) (ACUTE ONLY): 23 min  Charges:    $Gait Training: 8-22 mins $Therapeutic Exercise: 8-22 mins PT General Charges $$ ACUTE PT VISIT: 1 Visit                     Leita Sable, PT, DPT Acute Rehabilitation Services Secure Chat Preferred Office: [phone]    Leita JONETTA Sable 12/06/2023, 3:23 PM

## 2023-12-06 NOTE — Telephone Encounter (Signed)
 Walk at home is sufficient combined with the exercises that she'll get from PT

## 2023-12-06 NOTE — Progress Notes (Signed)
 Subjective: 1 Day Post-Op Procedure(s) (LRB): LEFT HIP ARTHROPLASTY, ANTERIOR APPROACH (Left) Patient reports pain as mild.    Objective: Vital signs in last 24 hours: Temp:  [97.4 F (36.3 C)-99.1 F (37.3 C)] 98.9 F (37.2 C) (10/07 0727) Pulse Rate:  [45-86] 65 (10/07 0727) Resp:  [10-20] 17 (10/07 0727) BP: (104-146)/(68-90) 123/71 (10/07 0727) SpO2:  [91 %-100 %] 98 % (10/07 0727) Weight:  [113.4 kg] 113.4 kg (10/06 0915)  Intake/Output from previous day: 10/06 0701 - 10/07 0700 In: 950 [I.V.:750; IV Piggyback:200] Out: 200 [Urine:50; Blood:150] Intake/Output this shift: No intake/output data recorded.  No results for input(s): HGB in the last 72 hours. No results for input(s): WBC, RBC, HCT, PLT in the last 72 hours. No results for input(s): NA, K, CL, CO2, BUN, CREATININE, GLUCOSE, CALCIUM  in the last 72 hours. No results for input(s): LABPT, INR in the last 72 hours.  Neurologically intact Neurovascular intact Sensation intact distally Intact pulses distally Dorsiflexion/Plantar flexion intact Incision: dressing C/D/I No cellulitis present Compartment soft   Assessment/Plan: 1 Day Post-Op Procedure(s) (LRB): LEFT HIP ARTHROPLASTY, ANTERIOR APPROACH (Left) Advance diet Up with therapy D/C IV fluids Discharge home with home health once cleared by PT WBAT LLE      Denise Morgan Grave 12/06/2023, 7:59 AM

## 2023-12-06 NOTE — Progress Notes (Signed)
 Patient awaiting family for discharge home, Patient in no acute distress nor complaints of pain nor discomfort; incision on hip is clean, dry and intact; No c/o pain at this time. Room was checked and accounted for all patient's belongings; discharge instructions concerning her medications, incision care, follow up appointment and when to call the doctor as needed were all discussed with patient by RN and she expressed understanding on the instructions given.

## 2023-12-06 NOTE — Telephone Encounter (Signed)
 Tried to call to notify patient. No answer.

## 2023-12-06 NOTE — Discharge Summary (Signed)
 Patient ID: Denise Morgan MRN: [id number] DOB/AGE: 58-Jan-1967 58 y.o.  Admit date: 12/05/2023 Discharge date: 12/06/2023  Admission Diagnoses:  Principal Problem:   Status post total replacement of left hip Active Problems:   Unilateral primary osteoarthritis, left hip   Discharge Diagnoses:  Same  Past Medical History:  Diagnosis Date   Allergy    Anemia    15+ years ago   Arthritis    Asthma    Blood transfusion without reported diagnosis    Depression    Dysrhythmia    Palpitations - Holter monitor in 2024   Family history of adverse reaction to anesthesia    sister had episode with anesthesia once, had a rection; had to start CPR   Frequent headaches    Resolved as of 2024   History of phlebitis    Hx of adenomatous polyp of colon 08/15/2015   Hypertension    Hypothyroidism    Palpitations    heart monitor all normal per pt.2017   PE (pulmonary embolism)     Surgeries: Procedure(s): LEFT HIP ARTHROPLASTY, ANTERIOR APPROACH on 12/05/2023   Consultants:   Discharged Condition: Improved  Hospital Course: Denise Morgan is an 58 y.o. female who was admitted 12/05/2023 for operative treatment ofStatus post total replacement of left hip. Patient has severe unremitting pain that affects sleep, daily activities, and work/hobbies. After pre-op clearance the patient was taken to the operating room on 12/05/2023 and underwent  Procedure(s): LEFT HIP ARTHROPLASTY, ANTERIOR APPROACH.    Patient was given perioperative antibiotics:  Anti-infectives (From admission, onward)    Start     Dose/Rate Route Frequency Ordered Stop   12/05/23 1900  ceFAZolin  (ANCEF ) IVPB 2g/100 mL premix        2 g 200 mL/hr over 30 Minutes Intravenous Every 6 hours 12/05/23 1849 12/06/23 0659   12/05/23 1245  vancomycin  (VANCOCIN ) powder  Status:  Discontinued          As needed 12/05/23 1245 12/05/23 1349   12/05/23 0915  ceFAZolin  (ANCEF ) IVPB 2g/100 mL premix        2 g 200 mL/hr over  30 Minutes Intravenous On call to O.R. 12/05/23 0902 12/05/23 1236        Patient was given sequential compression devices, early ambulation, and chemoprophylaxis to prevent DVT.  Inpatient Morphine Milligram Equivalents Per Day 10/6 - 10/7   Values displayed are in units of MME/Day    Order Start / End Date Yesterday Today    oxyCODONE  (Oxy IR/ROXICODONE ) immediate release tablet 5 mg 10/6 - 10/6 0 of Unknown --    oxyCODONE  (ROXICODONE ) 5 MG/5ML solution 5 mg 10/6 - 10/6 0 of Unknown --      Group total: 0 of Unknown     HYDROcodone -acetaminophen  (NORCO/VICODIN) 5-325 MG per tablet 1-2 tablet 10/6 - 10/6 10 of 5-10 --    HYDROcodone -acetaminophen  (NORCO) 7.5-325 MG per tablet 1-2 tablet 10/6 - No end date 0 of 7.5-15 0 of 22.5-45    morphine (PF) 2 MG/ML injection 0.5-1 mg 10/6 - No end date 0 of 1.5-3 0 of 6-12    HYDROmorphone  (DILAUDID ) injection 0.25-0.5 mg 10/6 - 10/6 30 of 40-80 --    fentaNYL  citrate (PF) (SUBLIMAZE ) injection 10/6 - 10/6 *75 of 75 --    HYDROmorphone  (DILAUDID ) injection 10/6 - 10/6 *10 of 10 --    HYDROcodone -acetaminophen  (NORCO/VICODIN) 5-325 MG per tablet 1-2 tablet 10/6 - No end date 10 of 5-10 20 of 30-60  Daily Totals  * 135 of Unknown (at least 144-203) 20 of 58.5-117  *One-Step medication  Calculation Errors     Order Type Date Details   oxyCODONE  (Oxy IR/ROXICODONE ) immediate release tablet 5 mg Ordered Dose -- Insufficient frequency information   oxyCODONE  (ROXICODONE ) 5 MG/5ML solution 5 mg Ordered Dose -- Insufficient frequency information            Patient benefited maximally from hospital stay and there were no complications.    Recent vital signs: Patient Vitals for the past 24 hrs:  BP Temp Temp src Pulse Resp SpO2 Height Weight  12/06/23 0727 123/71 98.9 F (37.2 C) Oral 65 17 98 % -- --  12/06/23 0553 120/74 98.4 F (36.9 C) Oral 78 18 97 % -- --  12/05/23 2311 (!) 142/85 99.1 F (37.3 C) Oral 86 18 98 % -- --  12/05/23 1911  133/80 98.1 F (36.7 C) Oral 70 18 100 % -- --  12/05/23 1900 137/79 98.2 F (36.8 C) -- 76 12 97 % -- --  12/05/23 1845 (!) 146/81 -- -- 79 17 94 % -- --  12/05/23 1830 (!) 142/82 -- -- 81 19 96 % -- --  12/05/23 1815 133/71 -- -- 78 20 96 % -- --  12/05/23 1800 130/68 98.2 F (36.8 C) -- 64 17 96 % -- --  12/05/23 1745 135/79 -- -- 81 14 96 % -- --  12/05/23 1730 (!) 144/70 -- -- 76 15 98 % -- --  12/05/23 1715 135/87 -- -- (!) 57 10 96 % -- --  12/05/23 1700 126/80 -- -- (!) 59 16 100 % -- --  12/05/23 1645 130/76 -- -- (!) 57 15 100 % -- --  12/05/23 1630 (!) 144/90 -- -- (!) 56 14 99 % -- --  12/05/23 1615 132/78 -- -- (!) 53 15 99 % -- --  12/05/23 1600 139/76 -- -- (!) 55 11 98 % -- --  12/05/23 1545 133/79 -- -- (!) 53 11 99 % -- --  12/05/23 1530 131/78 -- -- (!) 53 12 99 % -- --  12/05/23 1515 134/76 -- -- (!) 59 14 94 % -- --  12/05/23 1500 129/68 -- -- 65 16 95 % -- --  12/05/23 1445 138/74 -- -- (!) 45 12 95 % -- --  12/05/23 1430 122/78 -- -- (!) 50 16 95 % -- --  12/05/23 1423 -- -- -- (!) 47 14 95 % -- --  12/05/23 1415 117/77 -- -- (!) 49 17 93 % -- --  12/05/23 1406 -- -- -- (!) 53 16 94 % -- --  12/05/23 1400 104/89 -- -- (!) 52 18 93 % -- --  12/05/23 1353 112/75 (!) 97.4 F (36.3 C) -- (!) 50 14 91 % -- --  12/05/23 0915 (!) 145/83 98.7 F (37.1 C) Oral 65 18 97 % 5' 6.5 (1.689 m) 113.4 kg     Recent laboratory studies: No results for input(s): WBC, HGB, HCT, PLT, NA, K, CL, CO2, BUN, CREATININE, GLUCOSE, INR, CALCIUM  in the last 72 hours.  Invalid input(s): PT, 2   Discharge Medications:   Allergies as of 12/06/2023       Reactions   Latex Itching, Rash        Medication List     STOP taking these medications    meloxicam  15 MG tablet Commonly known as: MOBIC        TAKE these medications    Airsupra   90-80 MCG/ACT Aero Generic drug: Albuterol -Budesonide  Inhale 2 puffs into the lungs 3 (three) times daily  as needed (shortness of breath).   apixaban 2.5 MG Tabs tablet Commonly known as: Eliquis Take 1 tablet (2.5 mg total) by mouth 2 (two) times daily for 30 days post-op to prevent blood clots   HYDROcodone -acetaminophen  5-325 MG tablet Commonly known as: NORCO/VICODIN Take 1 tablet by mouth 3 (three) times daily as needed. To be taken after surgery for breakthrough pain   levothyroxine  50 MCG tablet Commonly known as: SYNTHROID  TAKE 1 TABLET BY MOUTH EVERY DAY BEFORE BREAKFAST   methocarbamol  750 MG tablet Commonly known as: ROBAXIN  Take 1 tablet (750 mg total) by mouth 3 (three) times daily as needed.   naloxone  4 MG/0.1ML Liqd nasal spray kit Commonly known as: NARCAN  Place 1 spray into the nose as needed (OD).   nebivolol  5 MG tablet Commonly known as: BYSTOLIC  Take 1 tablet (5 mg total) by mouth daily.   Oxycodone  HCl 10 MG Tabs Take 1 tablet (10 mg total) by mouth every 8 (eight) hours as needed.   potassium chloride  8 MEQ tablet Commonly known as: KLOR-CON  TAKE 1 TABLET (8 MEQ TOTAL) BY MOUTH 3 (THREE) TIMES DAILY.   QUEtiapine  300 MG tablet Commonly known as: SEROquel  Take 1 tablet (300 mg total) by mouth at bedtime.   rosuvastatin  20 MG tablet Commonly known as: Crestor  Take 1 tablet (20 mg total) by mouth daily.               Durable Medical Equipment  (From admission, onward)           Start     Ordered   12/05/23 1850  DME Walker rolling  Once       Question:  Patient needs a walker to treat with the following condition  Answer:  History of hip replacement   12/05/23 1849   12/05/23 1850  DME 3 n 1  Once        12/05/23 1849   12/05/23 1850  DME Bedside commode  Once       Question:  Patient needs a bedside commode to treat with the following condition  Answer:  History of hip replacement   12/05/23 1849            Diagnostic Studies: DG Pelvis Portable Result Date: 12/05/2023 CLINICAL DATA:  Status post left hip arthroplasty. EXAM:  PORTABLE PELVIS 1-2 VIEWS COMPARISON:  09/06/2023 FINDINGS: Left hip arthroplasty in expected alignment. No periprosthetic lucency or fracture. Recent postsurgical change includes air and edema in the soft tissues. Previous right hip arthroplasty. IMPRESSION: Left hip arthroplasty without immediate postoperative complication. Electronically Signed   By: Andrea Gasman M.D.   On: 12/05/2023 15:13   DG HIP UNILAT WITH PELVIS 1V LEFT Result Date: 12/05/2023 CLINICAL DATA:  Elective surgery. EXAM: DG HIP (WITH OR WITHOUT PELVIS) 1V*L* COMPARISON:  None Available. FINDINGS: Seven fluoroscopic spot views of the pelvis and left hip obtained in the operating room. Sequential images during hip arthroplasty. Fluoroscopy time 25.8. Dose 4.35 mGy. Previous right hip arthroplasty. IMPRESSION: Intraoperative fluoroscopy during left hip arthroplasty. Electronically Signed   By: Andrea Gasman M.D.   On: 12/05/2023 13:35   DG C-Arm 1-60 Min-No Report Result Date: 12/05/2023 Fluoroscopy was utilized by the requesting physician.  No radiographic interpretation.   DG C-Arm 1-60 Min-No Report Result Date: 12/05/2023 Fluoroscopy was utilized by the requesting physician.  No radiographic interpretation.    Disposition: Discharge disposition: 01-Home  or Self Care          Follow-up Information     Jule Ronal LITTIE DEVONNA. Schedule an appointment as soon as possible for a visit in 2 week(s).   Specialty: Orthopedic Surgery Contact information: [address] [place] [address] [phone]         [facility] Follow up.   Why: [facility] will contact you for the first home visit. PT/OT Contact information: [phone]                 Signed: Ronal LITTIE Jule 12/06/2023, 7:59 AM

## 2023-12-07 ENCOUNTER — Telehealth: Payer: Self-pay

## 2023-12-07 NOTE — Telephone Encounter (Signed)
 Denise Morgan has been submitted. Patient notified about no HHPT and to talk. Since there was no decision made yet about authorization on medicine, she will continue to check with pharmacy about approval.

## 2023-12-07 NOTE — Telephone Encounter (Signed)
 Tried to call again. No answer. Voicemail does not state who the receiver is.

## 2023-12-07 NOTE — Telephone Encounter (Signed)
 Patient was returning your call.  Patient also stated that Rx needs an authorization.  CB# [phone].  Please advise.  Thank you.

## 2023-12-20 ENCOUNTER — Encounter: Admitting: Physician Assistant

## 2023-12-21 ENCOUNTER — Ambulatory Visit (INDEPENDENT_AMBULATORY_CARE_PROVIDER_SITE_OTHER): Admitting: Physician Assistant

## 2023-12-21 DIAGNOSIS — Z96642 Presence of left artificial hip joint: Secondary | ICD-10-CM

## 2023-12-21 NOTE — Progress Notes (Signed)
 Post-Op Visit Note   Patient: Denise Morgan           Date of Birth: [birth year]           MRN: [id number] Visit Date: 12/21/2023 PCP: Joshua Debby CROME, MD   Assessment & Plan:  Chief Complaint:  Chief Complaint  Patient presents with   Left Hip - Routine Post Op    L THA 12/05/2023    Visit Diagnoses:  1. Status post total replacement of left hip     Plan: Patient is a very pleasant 58 year old female who comes in today 2 weeks status post left total hip replacement.  She has been doing well.  She has not had any home health physical therapy.  Currently ambulating with a walker.  She has been taking Eliquis twice daily for DVT prophylaxis.  She is on chronic oxy codon 10 mg and was provided a small prescription of Norco from us  following surgery.  Examination of her left hip reveals a well-healing surgical incision with nylon sutures in place.  No evidence of infection or cellulitis.  Calves are soft and nontender.  She is neurovascularly intact distally.  Today, sutures were removed and Steri-Strips applied.  I sent in a referral for outpatient physical therapy.  She will continue her Eliquis for total of 4 weeks and then transition to a baby aspirin twice daily for 2 weeks.  She will follow-up with Dr. Jerri in 4 weeks for repeat evaluation and AP pelvis x-rays.  Follow-Up Instructions: Return in about 4 weeks (around 01/18/2024) for f/u with Dr. Jerri.   Orders:  No orders of the defined types were placed in this encounter.  No orders of the defined types were placed in this encounter.   Imaging: No results found.  PMFS History: Patient Active Problem List   Diagnosis Date Noted   Status post total replacement of left hip 12/05/2023   Positive ANA (antinuclear antibody) 12/01/2023   Generalized osteoarthritis of multiple sites 12/01/2023   Acquired hemophilia (HCC) 11/23/2023   Unilateral primary osteoarthritis, left hip 09/06/2023   Drug-induced hypokalemia 08/22/2023    Primary osteoarthritis of both hips 08/22/2023   Need for vaccination 04/12/2023   Bradycardia 04/12/2023   Hyperplastic colonic polyp 04/06/2023   Psychophysiological insomnia 04/06/2023   Screening for colon cancer 01/04/2023   Prolonged Q-T interval on ECG 01/04/2023   Seasonal allergic rhinitis due to fungal spores 04/05/2022   Long-term current use of opiate analgesic 02/16/2020   Acquired hypothyroidism 02/12/2020   Chronic hip pain, bilateral 05/22/2019   Essential hypertension 04/07/2016   Snoring 04/07/2016   Hx of adenomatous polyp of colon 08/15/2015   Asthma, mild intermittent 05/29/2015   Hyperlipidemia with target LDL less than 130 05/29/2015   Obesity, morbid (HCC) 02/16/2013   Tobacco abuse disorder 11/19/2012   Visit for screening mammogram 11/17/2012   DJD (degenerative joint disease) of knee 11/17/2012   Chronic pain syndrome 11/17/2012   Past Medical History:  Diagnosis Date   Allergy    Anemia    15+ years ago   Arthritis    Asthma    Blood transfusion without reported diagnosis    Depression    Dysrhythmia    Palpitations - Holter monitor in 2024   Family history of adverse reaction to anesthesia    sister had episode with anesthesia once, had a rection; had to start CPR   Frequent headaches    Resolved as of 2024   History of phlebitis  Hx of adenomatous polyp of colon 08/15/2015   Hypertension    Hypothyroidism    Palpitations    heart monitor all normal per pt.2017   PE (pulmonary embolism)     Family History  Problem Relation Age of Onset   Hypertension Mother    Colon polyps Mother    Ovarian cancer Mother        had , no longer   Arthritis Mother    Hypertension Father    Asthma Father    Hypertension Brother    Colon cancer Maternal Aunt    Esophageal cancer Neg Hx    Rectal cancer Neg Hx    Stomach cancer Neg Hx     Past Surgical History:  Procedure Laterality Date   ABDOMINAL HYSTERECTOMY  03/01/2004   CHOLECYSTECTOMY      COLONOSCOPY W/ POLYPECTOMY  07/2020   TOTAL HIP ARTHROPLASTY Right 02/21/2023   Procedure: RIGHT TOTAL HIP ARTHROPLASTY ANTERIOR APPROACH;  Surgeon: Jerri Kay HERO, MD;  Location: [facility] OR;  Service: Orthopedics;  Laterality: Right;  3-C   TOTAL HIP ARTHROPLASTY Left 12/05/2023   Procedure: LEFT HIP ARTHROPLASTY, ANTERIOR APPROACH;  Surgeon: Jerri Kay HERO, MD;  Location: [facility] OR;  Service: Orthopedics;  Laterality: Left;   Social History   Occupational History   Not on file  Tobacco Use   Smoking status: Every Day    Current packs/day: 0.75    Average packs/day: 0.8 packs/day for 33.0 years (24.8 ttl pk-yrs)    Types: Cigarettes    Passive exposure: Current   Smokeless tobacco: Never  Vaping Use   Vaping status: Former  Substance and Sexual Activity   Alcohol use: Yes    Comment: 2-3x/week - wine   Drug use: No   Sexual activity: Not Currently    Birth control/protection: Surgical

## 2023-12-28 ENCOUNTER — Other Ambulatory Visit: Payer: Self-pay | Admitting: Internal Medicine

## 2023-12-28 DIAGNOSIS — I10 Essential (primary) hypertension: Secondary | ICD-10-CM

## 2023-12-28 DIAGNOSIS — E876 Hypokalemia: Secondary | ICD-10-CM

## 2023-12-30 ENCOUNTER — Other Ambulatory Visit: Payer: Self-pay | Admitting: Internal Medicine

## 2023-12-30 DIAGNOSIS — M160 Bilateral primary osteoarthritis of hip: Secondary | ICD-10-CM

## 2023-12-30 DIAGNOSIS — M170 Bilateral primary osteoarthritis of knee: Secondary | ICD-10-CM

## 2023-12-30 NOTE — Telephone Encounter (Signed)
 Copied from CRM #[id number]. Topic: Clinical - Medication Refill >> Dec 30, 2023  2:09 PM Leah W wrote: Medication: Oxycodone  HCl 10 MG TABS  Has the patient contacted their pharmacy? No (Agent: If no, request that the patient contact the pharmacy for the refill. If patient does not wish to contact the pharmacy document the reason why and proceed with request.) (Agent: If yes, when and what did the pharmacy advise?)  This is the patient's preferred pharmacy:  [id number] [place], [place] - [address] AT [facility] [address] [place] [address] Phone: [phone] Fax: [phone]    Is this the correct pharmacy for this prescription? Yes If no, delete pharmacy and type the correct one.   Has the prescription been filled recently? No  Is the patient out of the medication? No  Has the patient been seen for an appointment in the last year OR does the patient have an upcoming appointment? Yes  Can we respond through MyChart? Yes  Agent: Please be advised that Rx refills may take up to 3 business days. We ask that you follow-up with your pharmacy.

## 2024-01-02 ENCOUNTER — Encounter: Payer: Self-pay | Admitting: Radiology

## 2024-01-03 MED ORDER — OXYCODONE HCL 10 MG PO TABS: 10.0000 mg | ORAL_TABLET | Freq: Three times a day (TID) | ORAL | 0 refills | Status: DC | PRN

## 2024-01-18 ENCOUNTER — Other Ambulatory Visit (INDEPENDENT_AMBULATORY_CARE_PROVIDER_SITE_OTHER): Payer: Self-pay

## 2024-01-18 ENCOUNTER — Ambulatory Visit: Admitting: Physician Assistant

## 2024-01-18 DIAGNOSIS — Z96642 Presence of left artificial hip joint: Secondary | ICD-10-CM

## 2024-01-18 NOTE — Progress Notes (Signed)
 Post-Op Visit Note   Patient: Denise Morgan           Date of Birth: 04-10-1965           MRN: [id number] Visit Date: 01/18/2024 PCP: Joshua Debby CROME, MD   Assessment & Plan:  Chief Complaint:  Chief Complaint  Patient presents with   Left Hip - Follow-up    Left THA 12/05/2023   Visit Diagnoses:  1. Status post total replacement of left hip     Plan: Patient is a pleasant 58 year old female who comes in today 6 weeks status post left total hip replacement.  She has been doing well.  She has finished her aspirin for which she was taking for DVT prophylaxis.  She is on a regularly scheduled dose of oxycodone  for which she gets from pain management.  Overall doing well and ambulating without assistance.  Examination of left hip reveals painless hip flexion logroll.  She is neurovascularly intact distally.  At this point, she will continue to advance with activity as tolerated.  Dental prophylaxis reinforced.  Follow-up in 6 weeks for recheck.  Call with concerns or questions.  Follow-Up Instructions: Return in about 6 weeks (around 02/29/2024).   Orders:  Orders Placed This Encounter  Procedures   XR Pelvis 1-2 Views   No orders of the defined types were placed in this encounter.   Imaging: XR Pelvis 1-2 Views Result Date: 01/18/2024 Well seated prosthesis without complication   PMFS History: Patient Active Problem List   Diagnosis Date Noted   Status post total replacement of left hip 12/05/2023   Positive ANA (antinuclear antibody) 12/01/2023   Generalized osteoarthritis of multiple sites 12/01/2023   Acquired hemophilia (HCC) 11/23/2023   Unilateral primary osteoarthritis, left hip 09/06/2023   Drug-induced hypokalemia 08/22/2023   Primary osteoarthritis of both hips 08/22/2023   Need for vaccination 04/12/2023   Bradycardia 04/12/2023   Hyperplastic colonic polyp 04/06/2023   Psychophysiological insomnia 04/06/2023   Screening for colon cancer 01/04/2023    Prolonged Q-T interval on ECG 01/04/2023   Seasonal allergic rhinitis due to fungal spores 04/05/2022   Long-term current use of opiate analgesic 02/16/2020   Acquired hypothyroidism 02/12/2020   Chronic hip pain, bilateral 05/22/2019   Essential hypertension 04/07/2016   Snoring 04/07/2016   Hx of adenomatous polyp of colon 08/15/2015   Asthma, mild intermittent 05/29/2015   Hyperlipidemia with target LDL less than 130 05/29/2015   Obesity, morbid (HCC) 02/16/2013   Tobacco abuse disorder 11/19/2012   Visit for screening mammogram 11/17/2012   DJD (degenerative joint disease) of knee 11/17/2012   Chronic pain syndrome 11/17/2012   Past Medical History:  Diagnosis Date   Allergy    Anemia    15+ years ago   Arthritis    Asthma    Blood transfusion without reported diagnosis    Depression    Dysrhythmia    Palpitations - Holter monitor in 2024   Family history of adverse reaction to anesthesia    sister had episode with anesthesia once, had a rection; had to start CPR   Frequent headaches    Resolved as of 2024   History of phlebitis    Hx of adenomatous polyp of colon 08/15/2015   Hypertension    Hypothyroidism    Palpitations    heart monitor all normal per pt.2017   PE (pulmonary embolism)     Family History  Problem Relation Age of Onset   Hypertension Mother  Colon polyps Mother    Ovarian cancer Mother        had , no longer   Arthritis Mother    Hypertension Father    Asthma Father    Hypertension Brother    Colon cancer Maternal Aunt    Esophageal cancer Neg Hx    Rectal cancer Neg Hx    Stomach cancer Neg Hx     Past Surgical History:  Procedure Laterality Date   ABDOMINAL HYSTERECTOMY  03/01/2004   CHOLECYSTECTOMY     COLONOSCOPY W/ POLYPECTOMY  07/2020   TOTAL HIP ARTHROPLASTY Right 02/21/2023   Procedure: RIGHT TOTAL HIP ARTHROPLASTY ANTERIOR APPROACH;  Surgeon: Jerri Kay HERO, MD;  Location: [facility] OR;  Service: Orthopedics;  Laterality: Right;   3-C   TOTAL HIP ARTHROPLASTY Left 12/05/2023   Procedure: LEFT HIP ARTHROPLASTY, ANTERIOR APPROACH;  Surgeon: Jerri Kay HERO, MD;  Location: [facility] OR;  Service: Orthopedics;  Laterality: Left;   Social History   Occupational History   Not on file  Tobacco Use   Smoking status: Every Day    Current packs/day: 0.75    Average packs/day: 0.8 packs/day for 33.0 years (24.8 ttl pk-yrs)    Types: Cigarettes    Passive exposure: Current   Smokeless tobacco: Never  Vaping Use   Vaping status: Former  Substance and Sexual Activity   Alcohol use: Yes    Comment: 2-3x/week - wine   Drug use: No   Sexual activity: Not Currently    Birth control/protection: Surgical

## 2024-02-01 ENCOUNTER — Encounter: Payer: Self-pay | Admitting: Internal Medicine

## 2024-02-01 ENCOUNTER — Other Ambulatory Visit: Payer: Self-pay | Admitting: Internal Medicine

## 2024-02-01 DIAGNOSIS — M170 Bilateral primary osteoarthritis of knee: Secondary | ICD-10-CM

## 2024-02-01 DIAGNOSIS — M160 Bilateral primary osteoarthritis of hip: Secondary | ICD-10-CM

## 2024-02-01 MED ORDER — OXYCODONE HCL 10 MG PO TABS: 10.0000 mg | ORAL_TABLET | Freq: Three times a day (TID) | ORAL | 0 refills | Status: DC | PRN

## 2024-02-14 ENCOUNTER — Ambulatory Visit: Admitting: Internal Medicine

## 2024-02-14 ENCOUNTER — Ambulatory Visit (INDEPENDENT_AMBULATORY_CARE_PROVIDER_SITE_OTHER)

## 2024-02-14 ENCOUNTER — Encounter: Payer: Self-pay | Admitting: Internal Medicine

## 2024-02-14 VITALS — BP 132/82 | HR 69 | Temp 98.5°F | Resp 16 | Ht 66.5 in | Wt 255.6 lb

## 2024-02-14 DIAGNOSIS — Z79891 Long term (current) use of opiate analgesic: Secondary | ICD-10-CM

## 2024-02-14 DIAGNOSIS — M5106 Intervertebral disc disorders with myelopathy, lumbar region: Secondary | ICD-10-CM

## 2024-02-14 DIAGNOSIS — M5442 Lumbago with sciatica, left side: Secondary | ICD-10-CM | POA: Insufficient documentation

## 2024-02-14 DIAGNOSIS — I10 Essential (primary) hypertension: Secondary | ICD-10-CM

## 2024-02-14 DIAGNOSIS — M4316 Spondylolisthesis, lumbar region: Secondary | ICD-10-CM | POA: Diagnosis not present

## 2024-02-14 MED ORDER — MELOXICAM 15 MG PO TABS: 15.0000 mg | ORAL_TABLET | Freq: Every day | ORAL | 0 refills | Status: AC

## 2024-02-14 MED ORDER — KETOROLAC TROMETHAMINE 60 MG/2ML IM SOLN
60.0000 mg | Freq: Once | INTRAMUSCULAR | Status: AC
  Administered 2024-02-14: 12:00:00 60 mg via INTRAMUSCULAR

## 2024-02-14 MED ORDER — METHYLPREDNISOLONE 4 MG PO TBPK: ORAL_TABLET | ORAL | 0 refills | Status: AC

## 2024-02-14 NOTE — Progress Notes (Signed)
 Subjective:  Patient ID: Denise Morgan, female    DOB: [birth year]  Age: 58 y.o. MRN: [id number]  CC: Hyperlipidemia, Hypothyroidism, and Hypertension   HPI Denise Morgan presents for f/up ---  Discussed the use of AI scribe software for clinical note transcription with the patient, who gave verbal consent to proceed.  History of Present Illness Denise Morgan is a 58 year old female who presents with worsening lower back pain radiating to the left thigh.  She has a history of chronic back pain, which has significantly worsened over the past week. The pain is located in the lower back and is described as severe, especially when moving or stepping in certain ways. She describes the pain as 'it just grabs you and just, it's just pain.'  The pain radiates down the back of her left leg to the thigh. She also experiences a burning sensation under the skin, which occurs intermittently in both legs. There is no associated numbness or weakness. She mentions that sometimes her left leg 'doesn't want to work' when stepping or walking.  She denies any recent injury or trauma to the back. The exacerbation of pain began suddenly one morning and has progressively worsened since then.  Her current pain management includes oxycodone  and Robaxin . She notes an increase in her medication usage due to the severity of the pain, but she is cautious not to overuse them.     Outpatient Medications Prior to Visit  Medication Sig Dispense Refill   Albuterol -Budesonide  (AIRSUPRA ) 90-80 MCG/ACT AERO Inhale 2 puffs into the lungs 3 (three) times daily as needed (shortness of breath).     docusate sodium  (COLACE) 100 MG capsule Take 100 mg by mouth daily as needed.     levothyroxine  (SYNTHROID ) 50 MCG tablet TAKE 1 TABLET BY MOUTH EVERY DAY BEFORE BREAKFAST 90 tablet 1   methocarbamol  (ROBAXIN ) 750 MG tablet Take 1 tablet (750 mg total) by mouth 3 (three) times daily as needed. 30 tablet 2   naloxone   (NARCAN ) nasal spray 4 mg/0.1 mL Place 1 spray into the nose as needed (OD).     nebivolol  (BYSTOLIC ) 5 MG tablet Take 1 tablet (5 mg total) by mouth daily. 90 tablet 0   Oxycodone  HCl 10 MG TABS Take 1 tablet (10 mg total) by mouth every 8 (eight) hours as needed. 90 tablet 0   potassium chloride  (KLOR-CON ) 8 MEQ tablet TAKE 1 TABLET (8 MEQ TOTAL) BY MOUTH 3 (THREE) TIMES DAILY. 270 tablet 0   QUEtiapine  (SEROQUEL ) 300 MG tablet Take 1 tablet (300 mg total) by mouth at bedtime. 90 tablet 0   rosuvastatin  (CRESTOR ) 20 MG tablet Take 1 tablet (20 mg total) by mouth daily. 90 tablet 0   apixaban  (ELIQUIS ) 2.5 MG TABS tablet Take 1 tablet (2.5 mg total) by mouth 2 (two) times daily for 30 days post-op to prevent blood clots 60 tablet 0   No facility-administered medications prior to visit.    ROS Review of Systems  Constitutional:  Negative for appetite change, chills, diaphoresis, fatigue and fever.  HENT: Negative.    Eyes: Negative.   Respiratory: Negative.  Negative for cough, chest tightness, shortness of breath and wheezing.   Cardiovascular:  Negative for chest pain, palpitations and leg swelling.  Gastrointestinal:  Negative for abdominal pain, blood in stool, constipation, diarrhea, nausea and vomiting.  Endocrine: Negative.   Genitourinary: Negative.  Negative for difficulty urinating.  Musculoskeletal:  Positive for arthralgias and back pain. Negative for joint  swelling and myalgias.  Skin:  Negative for rash.  Neurological:  Negative for dizziness, weakness, light-headedness and numbness.  Hematological:  Negative for adenopathy. Does not bruise/bleed easily.  Psychiatric/Behavioral: Negative.      Objective:  BP 132/82 (BP Location: Left Arm, Patient Position: Sitting, Cuff Size: Large)   Pulse 69   Temp 98.5 F (36.9 C) (Oral)   Resp 16   Ht 5' 6.5 (1.689 m)   Wt 255 lb 9.6 oz (115.9 kg)   LMP 03/01/2004   SpO2 98%   BMI 40.64 kg/m   BP Readings from Last 3  Encounters:  02/14/24 132/82  12/06/23 123/71  12/01/23 (!) 154/90    Wt Readings from Last 3 Encounters:  02/14/24 255 lb 9.6 oz (115.9 kg)  12/05/23 250 lb (113.4 kg)  12/01/23 254 lb 6.4 oz (115.4 kg)    Physical Exam Vitals reviewed.  Constitutional:      Appearance: Normal appearance.  HENT:     Nose: Nose normal.     Mouth/Throat:     Mouth: Mucous membranes are moist.  Eyes:     General: No scleral icterus.    Conjunctiva/sclera: Conjunctivae normal.  Cardiovascular:     Rate and Rhythm: Normal rate and regular rhythm.     Pulses: Normal pulses.     Heart sounds: No murmur heard.    No friction rub. No gallop.  Pulmonary:     Effort: Pulmonary effort is normal.     Breath sounds: No stridor. No wheezing, rhonchi or rales.  Abdominal:     General: Abdomen is protuberant. Bowel sounds are normal. There is no distension.     Palpations: Abdomen is soft. There is no hepatomegaly, splenomegaly or mass.     Tenderness: There is no abdominal tenderness. There is no guarding.  Musculoskeletal:     Cervical back: Neck supple.     Thoracic back: Normal.     Lumbar back: No swelling, edema, deformity, tenderness or bony tenderness. Normal range of motion. Positive left straight leg raise test. Negative right straight leg raise test.     Right lower leg: No edema.     Left lower leg: No edema.  Lymphadenopathy:     Cervical: No cervical adenopathy.  Skin:    General: Skin is warm and dry.  Neurological:     General: No focal deficit present.     Mental Status: She is alert. Mental status is at baseline.     Deep Tendon Reflexes:     Reflex Scores:      Tricep reflexes are 0 on the right side and 0 on the left side.      Bicep reflexes are 0 on the right side and 0 on the left side.      Brachioradialis reflexes are 1+ on the right side and 1+ on the left side.      Patellar reflexes are 0 on the right side and 0 on the left side.      Achilles reflexes are 0 on the  right side and 0 on the left side.    Lab Results  Component Value Date   WBC 6.2 11/23/2023   HGB 13.7 11/23/2023   HCT 41.2 11/23/2023   PLT 224.0 11/23/2023   GLUCOSE 94 11/23/2023   CHOL 204 (H) 11/23/2023   TRIG 108.0 11/23/2023   HDL 73.80 11/23/2023   LDLDIRECT 155.6 02/16/2013   LDLCALC 108 (H) 11/23/2023   ALT 13 08/22/2023   AST  15 08/22/2023   NA 138 11/23/2023   K 3.8 11/23/2023   CL 105 11/23/2023   CREATININE 0.74 11/23/2023   BUN 23 11/23/2023   CO2 25 11/23/2023   TSH 1.72 11/23/2023   INR 3.2 11/24/2012   HGBA1C 5.8 08/22/2023    DG Pelvis Portable Result Date: 12/05/2023 CLINICAL DATA:  Status post left hip arthroplasty. EXAM: PORTABLE PELVIS 1-2 VIEWS COMPARISON:  09/06/2023 FINDINGS: Left hip arthroplasty in expected alignment. No periprosthetic lucency or fracture. Recent postsurgical change includes air and edema in the soft tissues. Previous right hip arthroplasty. IMPRESSION: Left hip arthroplasty without immediate postoperative complication. Electronically Signed   By: Andrea Gasman M.D.   On: 12/05/2023 15:13   DG HIP UNILAT WITH PELVIS 1V LEFT Result Date: 12/05/2023 CLINICAL DATA:  Elective surgery. EXAM: DG HIP (WITH OR WITHOUT PELVIS) 1V*L* COMPARISON:  None Available. FINDINGS: Seven fluoroscopic spot views of the pelvis and left hip obtained in the operating room. Sequential images during hip arthroplasty. Fluoroscopy time 25.8. Dose 4.35 mGy. Previous right hip arthroplasty. IMPRESSION: Intraoperative fluoroscopy during left hip arthroplasty. Electronically Signed   By: Andrea Gasman M.D.   On: 12/05/2023 13:35   DG C-Arm 1-60 Min-No Report Result Date: 12/05/2023 Fluoroscopy was utilized by the requesting physician.  No radiographic interpretation.   DG C-Arm 1-60 Min-No Report Result Date: 12/05/2023 Fluoroscopy was utilized by the requesting physician.  No radiographic interpretation.    DG Lumbar Spine Complete Result Date:  02/14/2024 CLINICAL DATA:  Low back pain for 1 week. No known injury indicated. Recent hip replacement. EXAM: LUMBAR SPINE - COMPLETE 4+ VIEW COMPARISON:  06/24/2023 FINDINGS: Five non-rib-bearing lumbar vertebrae. Facet degenerative changes at multiple levels with associated stable grade 1 anterolisthesis at the L4-5 level. No fractures, pars defects or acute subluxations. Bilateral hip prostheses are noted as well as cholecystectomy clips. IMPRESSION: 1. No acute abnormality. 2. Stable multilevel facet degenerative changes with associated stable grade 1 anterolisthesis at the L4-5 level. Electronically Signed   By: Elspeth Bathe M.D.   On: 02/14/2024 11:16     Assessment & Plan:  Long-term current use of opiate analgesic -     DRUG MONITORING, PANEL 7 WITH CONFIRMATION, URINE; Future  Acute left-sided low back pain with left-sided sciatica -     DG Lumbar Spine Complete; Future -     Meloxicam ; Take 1 tablet (15 mg total) by mouth daily.  Dispense: 30 tablet; Refill: 0  Essential hypertension- BP is well controlled.  Lumbar disc herniation with myelopathy- She is neurologically intact. -     methylPREDNISolone ; TAKE AS DIRECTED  Dispense: 21 tablet; Refill: 0 -     Meloxicam ; Take 1 tablet (15 mg total) by mouth daily.  Dispense: 30 tablet; Refill: 0     Follow-up: Return in about 3 months (around 05/14/2024).  Debby Molt, MD

## 2024-02-14 NOTE — Patient Instructions (Signed)
 Exercises for a Herniated Disk Exercises can help you get better if you have a herniated disk. Only do the exercises you were told to do. Make sure you know how to do the exercises safely. Follow the steps below. It's normal to feel mild discomfort. Stop if you feel pain or your pain gets worse. Do not start these exercises until told by your health care provider. Stretching and range-of-motion exercises These exercises warm up your muscles and joints. They also help with movement and flexibility of your back. They may help to relieve pain, numbness, and tingling. Prone extension on elbows  Lie on your belly on a firm surface. This is called a prone position. Prop yourself up on your elbows. Use your arms to help lift your chest up until you feel a gentle stretch in your belly and your lower back. This will place some of your body weight on your elbows. If this is uncomfortable, try stacking pillows under your chest. Your hips should stay down, against the surface that you're lying on. Keep your hips, back, and butt muscles relaxed. Hold this position for __________ seconds. Slowly relax your upper body and return to the starting position. Repeat __________ times. Complete this exercise __________ times a day. Standing extension  Stand with your feet shoulder-width apart. Place your hands on your lower back, with your palms on your back. Gently arch your back (extension) and press forward with your hands. Keep your hips over your toes. Do not let your hips drift forward while arching your back. Hold this position for __________ seconds. Slowly return to the starting position. Repeat __________ times. Complete this exercise __________ times a day. Strengthening exercises These exercises build strength and endurance in your back. Endurance is the ability to use your muscles for a long time, even after they get tired. Pelvic tilt  Lie on your back on a firm surface. Bend your knees and keep  your feet flat on the floor. Tense your belly muscles. Tip your pelvis up toward the ceiling and flatten your lower back into the floor. To help with this exercise, you may place a small towel under your lower back and try to push your back into the towel. Hold this position for __________ seconds. Let your muscles relax completely before you repeat this exercise. Repeat __________ times. Complete this exercise __________ times a day. Alternating arm and leg raises  Get on your hands and knees on a firm surface. If you're on a hard floor, you may want to use padding, such as an exercise mat, to cushion your knees. Line up your arms and legs. Your hands should be below your shoulders. Your knees should be below your hips. Lift your left leg behind you. At the same time, raise your right arm and straighten it in front of you. If you have trouble with your balance or the exercise is painful, start by raising one arm or one leg separately first. Do not lift your leg higher than your hip. Do not lift your arm higher than your shoulder. Keep your belly and back muscles tight. Keep your hips facing the ground. Do not arch your back. Keep your balance carefully. Continue to look down at the floor for improved balance. Breathe easy and don't hold your breath. Hold this position for __________ seconds. Slowly return to the starting position and repeat with your right leg and your left arm. Repeat __________ times. Complete this exercise __________ times a day. Bridge  Lie on your  back on a firm surface with your knees bent and your feet flat on the floor. Tighten your belly and butt muscles and lift your butt off the floor until your trunk and hips are level with your thighs. You should feel the muscles working in your butt and the back of your thighs. Do not arch your back. If this exercise is too easy, try doing it with your arms crossed over your chest. Hold this position for __________  seconds. Slowly lower your hips to the starting position. Let your muscles relax completely after each repetition. Repeat __________ times. Complete this exercise __________ times a day. This information is not intended to replace advice given to you by your health care provider. Make sure you discuss any questions you have with your health care provider. Document Revised: 09/23/2022 Document Reviewed: 09/23/2022 Elsevier Patient Education  2024 [employer].

## 2024-02-20 ENCOUNTER — Other Ambulatory Visit: Payer: Self-pay | Admitting: Internal Medicine

## 2024-02-20 DIAGNOSIS — E785 Hyperlipidemia, unspecified: Secondary | ICD-10-CM

## 2024-02-21 ENCOUNTER — Other Ambulatory Visit: Payer: Self-pay | Admitting: Internal Medicine

## 2024-02-21 DIAGNOSIS — F5104 Psychophysiologic insomnia: Secondary | ICD-10-CM

## 2024-03-02 ENCOUNTER — Encounter: Payer: Self-pay | Admitting: Internal Medicine

## 2024-03-06 ENCOUNTER — Other Ambulatory Visit: Payer: Self-pay

## 2024-03-06 DIAGNOSIS — M160 Bilateral primary osteoarthritis of hip: Secondary | ICD-10-CM

## 2024-03-06 DIAGNOSIS — M170 Bilateral primary osteoarthritis of knee: Secondary | ICD-10-CM

## 2024-03-08 ENCOUNTER — Ambulatory Visit: Admitting: Orthopaedic Surgery

## 2024-03-08 DIAGNOSIS — Z96642 Presence of left artificial hip joint: Secondary | ICD-10-CM

## 2024-03-08 MED ORDER — OXYCODONE HCL 10 MG PO TABS: 10.0000 mg | ORAL_TABLET | Freq: Three times a day (TID) | ORAL | 0 refills | Status: AC | PRN

## 2024-03-08 NOTE — Progress Notes (Signed)
 "  Office Visit Note   Patient: Denise Morgan           Date of Birth: 1965/09/13           MRN: [id number] Visit Date: 03/08/2024              Requested by: Joshua Debby CROME, MD [address] [place],  [place] [address] PCP: Joshua Debby CROME, MD   Assessment & Plan: Visit Diagnoses:  1. Status post total replacement of left hip     Plan: History of Present Illness Denise Morgan is a 59 year old female with bilateral hip arthroplasties who presents for routine three-month follow-up after left total hip arthroplasty.  She is three months status post left total hip replacement and is back to normal activities without pain, instability, or functional limitations in either hip.  She previously underwent right hip arthroplasty and has no current symptoms or concerns with the right hip.  Physical Exam MUSCULOSKELETAL: Bilateral hip scars well-healed.  Fluid painless range of motion.  Assessment and Plan Status post left total hip arthroplasty Three months post-surgery, smooth recovery, normal activity resumption, no complications. - Advised to avoid yoga and strenuous activities involving hip torque to minimize dislocation risk. - Instructed to adhere to hip precautions. - Recommended resumption of activities as tolerated. - Scheduled follow-up in three months. - Instructed to contact office with questions or concerns.  Follow-Up Instructions: Return in about 3 months (around 06/06/2024) for with lindsey.   Orders:  No orders of the defined types were placed in this encounter.  No orders of the defined types were placed in this encounter.     Procedures: No procedures performed   Clinical Data: No additional findings.   Subjective: Chief Complaint  Patient presents with   Left Hip - Follow-up    Left THA 12/05/2023    HPI  Review of Systems  Constitutional: Negative.   HENT: Negative.    Eyes: Negative.   Respiratory: Negative.    Cardiovascular:  Negative.   Endocrine: Negative.   Musculoskeletal: Negative.   Neurological: Negative.   Hematological: Negative.   Psychiatric/Behavioral: Negative.    All other systems reviewed and are negative.    Objective: Vital Signs: LMP 03/01/2004   Physical Exam Vitals and nursing note reviewed.  Constitutional:      Appearance: She is well-developed.  HENT:     Head: Atraumatic.     Nose: Nose normal.  Eyes:     Extraocular Movements: Extraocular movements intact.  Cardiovascular:     Pulses: Normal pulses.  Pulmonary:     Effort: Pulmonary effort is normal.  Abdominal:     Palpations: Abdomen is soft.  Musculoskeletal:     Cervical back: Neck supple.  Skin:    General: Skin is warm.     Capillary Refill: Capillary refill takes less than 2 seconds.  Neurological:     Mental Status: She is alert. Mental status is at baseline.  Psychiatric:        Behavior: Behavior normal.        Thought Content: Thought content normal.        Judgment: Judgment normal.     Ortho Exam  Specialty Comments:  No specialty comments available.  Imaging: No results found.   PMFS History: Patient Active Problem List   Diagnosis Date Noted   Acute left-sided low back pain with left-sided sciatica 02/14/2024   Lumbar disc herniation with myelopathy 02/14/2024   Status post  total replacement of left hip 12/05/2023   Positive ANA (antinuclear antibody) 12/01/2023   Generalized osteoarthritis of multiple sites 12/01/2023   Acquired hemophilia (HCC) 11/23/2023   Unilateral primary osteoarthritis, left hip 09/06/2023   Drug-induced hypokalemia 08/22/2023   Primary osteoarthritis of both hips 08/22/2023   Need for vaccination 04/12/2023   Bradycardia 04/12/2023   Hyperplastic colonic polyp 04/06/2023   Psychophysiological insomnia 04/06/2023   Screening for colon cancer 01/04/2023   Prolonged Q-T interval on ECG 01/04/2023   Seasonal allergic rhinitis due to fungal spores 04/05/2022    Long-term current use of opiate analgesic 02/16/2020   Acquired hypothyroidism 02/12/2020   Chronic hip pain, bilateral 05/22/2019   Essential hypertension 04/07/2016   Snoring 04/07/2016   Hx of adenomatous polyp of colon 08/15/2015   Asthma, mild intermittent 05/29/2015   Hyperlipidemia with target LDL less than 130 05/29/2015   Obesity, morbid (HCC) 02/16/2013   Tobacco abuse disorder 11/19/2012   Visit for screening mammogram 11/17/2012   DJD (degenerative joint disease) of knee 11/17/2012   Chronic pain syndrome 11/17/2012   Past Medical History:  Diagnosis Date   Allergy    Anemia    15+ years ago   Arthritis    Asthma    Blood transfusion without reported diagnosis    Depression    Dysrhythmia    Palpitations - Holter monitor in 2024   Family history of adverse reaction to anesthesia    sister had episode with anesthesia once, had a rection; had to start CPR   Frequent headaches    Resolved as of 2024   History of phlebitis    Hx of adenomatous polyp of colon 08/15/2015   Hypertension    Hypothyroidism    Palpitations    heart monitor all normal per pt.2017   PE (pulmonary embolism)     Family History  Problem Relation Age of Onset   Hypertension Mother    Colon polyps Mother    Ovarian cancer Mother        had , no longer   Arthritis Mother    Hypertension Father    Asthma Father    Hypertension Brother    Colon cancer Maternal Aunt    Esophageal cancer Neg Hx    Rectal cancer Neg Hx    Stomach cancer Neg Hx     Past Surgical History:  Procedure Laterality Date   ABDOMINAL HYSTERECTOMY  03/01/2004   CHOLECYSTECTOMY     COLONOSCOPY W/ POLYPECTOMY  07/2020   TOTAL HIP ARTHROPLASTY Right 02/21/2023   Procedure: RIGHT TOTAL HIP ARTHROPLASTY ANTERIOR APPROACH;  Surgeon: Jerri Kay HERO, MD;  Location: [facility] OR;  Service: Orthopedics;  Laterality: Right;  3-C   TOTAL HIP ARTHROPLASTY Left 12/05/2023   Procedure: LEFT HIP ARTHROPLASTY, ANTERIOR APPROACH;   Surgeon: Jerri Kay HERO, MD;  Location: [facility] OR;  Service: Orthopedics;  Laterality: Left;   Social History   Occupational History   Not on file  Tobacco Use   Smoking status: Every Day    Current packs/day: 0.75    Average packs/day: 0.8 packs/day for 33.0 years (24.8 ttl pk-yrs)    Types: Cigarettes    Passive exposure: Current   Smokeless tobacco: Never  Vaping Use   Vaping status: Former  Substance and Sexual Activity   Alcohol use: Yes    Comment: 2-3x/week - wine   Drug use: No   Sexual activity: Not Currently    Birth control/protection: Surgical        "

## 2024-03-12 ENCOUNTER — Telehealth: Payer: Self-pay

## 2024-03-12 NOTE — Telephone Encounter (Signed)
 Please advise

## 2024-03-12 NOTE — Telephone Encounter (Signed)
 Copied from CRM [id number]. Topic: General - Other >> Mar 12, 2024 10:48 AM Treva T wrote: Reason for CRM: Received call from [facility] with [facility], requesting address to office and office fax number.  Caller declined leaving her number for a return call.   Reports pt is calling for PA on medication Oxycodone .   States office may receive a fax requesting this information as well.  No further details provided.

## 2024-03-13 ENCOUNTER — Ambulatory Visit: Payer: Self-pay

## 2024-03-13 ENCOUNTER — Telehealth: Payer: Self-pay

## 2024-03-13 ENCOUNTER — Other Ambulatory Visit (HOSPITAL_COMMUNITY): Payer: Self-pay

## 2024-03-13 NOTE — Telephone Encounter (Signed)
 FYI Only or Action Required?: Action required by provider: update patient on plan of care/medication status.  Patient was last seen in primary care on 02/14/2024 by Joshua Debby CROME, MD.  Called Nurse Triage reporting Pain and Pain Management.   Triage Disposition: Call PCP Now (overriding See HCP Within 4 Hours (Or PCP Triage))  Patient/caregiver understands and will follow disposition?: Yes                       Copied from CRM #[id number]. Topic: Clinical - Red Word Triage >> Mar 13, 2024 12:49 PM Taleah C wrote: Red Word that prompted transfer to Nurse Triage: pt has been trying to get pan med refilll since 1/3 and has now had worse pain since. Reason for Disposition  [1] SEVERE back pain (e.g., excruciating, unable to do any normal activities) AND [2] not improved 2 hours after pain medicine  Answer Assessment - Initial Assessment Questions Patient calling in today for her oxycodone  that was refilled on 03/08/24. Spoke to her insurance company yesterday and they gave her the [place] # [id number]. She's frustrated since this happens every month and delays her getting her pain meds. Been out since 03/03/24. Pharmacy called yesterday and CRM was routed to PA team  but no updates in chart to share with patient.      1. ONSET: When did the pain begin? (e.g., minutes, hours, days)     Chronic  2. LOCATION: Where does it hurt? (upper, mid or lower back)     Lower.  3. SEVERITY: How bad is the pain?  (e.g., Scale 1-10; mild, moderate, or severe)     8/10  4. PATTERN: Is the pain constant? (e.g., yes, no; constant, intermittent)      Constant.  5. RADIATION: Does the pain shoot into your legs or somewhere else?     Hips.  6. CAUSE:  What do you think is causing the back pain?      Herniated disc, severe arthritis  7. BACK OVERUSE:  Any recent lifting of heavy objects, strenuous work or exercise?     No.  8. MEDICINES: What have you taken so far for the  pain? (e.g., nothing, acetaminophen , NSAIDS)     Tylenol , Advil , Aleve.  9. NEUROLOGIC SYMPTOMS: Do you have any weakness, numbness, or problems with bowel/bladder control?     No. Urinary incontinence since December 2024.  10. OTHER SYMPTOMS: Do you have any other symptoms? (e.g., fever, abdomen pain, burning with urination, blood in urine)       Whole body pain, bilateral hip pain with replacements. No LOC/fainting, abdominal pain, new numbness or weakness  Protocols used: Back Pain-A-AH

## 2024-03-13 NOTE — Telephone Encounter (Signed)
 Pharmacy Patient Advocate Encounter   Received notification from Physician's Office that prior authorization for oxyCODONE  HCl 10MG  tablets is required/requested.   Insurance verification completed.   The patient is insured through [employer].   Per test claim: PA required; PA started via CoverMyMeds. KEY [id number] . Waiting for clinical questions to populate.

## 2024-03-14 NOTE — Telephone Encounter (Signed)
 Can we run a PA for this medication for the patient?

## 2024-03-15 ENCOUNTER — Telehealth: Payer: Self-pay

## 2024-03-15 NOTE — Telephone Encounter (Signed)
 Copied from CRM [id number]. Topic: General - Other >> Mar 12, 2024 10:48 AM Treva T wrote: Reason for CRM: Received call from [facility] with [facility], requesting address to office and office fax number.  Caller declined leaving her number for a return call.   Reports pt is calling for PA on medication Oxycodone .   States office may receive a fax requesting this information as well.  No further details provided. >> Mar 15, 2024 11:33 AM Deleta RAMAN wrote: Patient is calling regarding prior authorization states the authorization was not completed, but medication refill was sent to pharmacy. Patient spoke with nurse and still have not received anything states she is out of the medication as well.

## 2024-03-16 NOTE — Telephone Encounter (Signed)
 Patient has been made aware that the PA was started by the PA team on 03/13/2024 and we are awaiting PA questions to populate so they can move forward. Patient gave a verbal understanding and states that she will call the insurance company because she needs her medicine.

## 2024-03-19 NOTE — Telephone Encounter (Signed)
 Calling plan to see what can be done.

## 2024-03-21 ENCOUNTER — Encounter: Payer: Self-pay | Admitting: Internal Medicine

## 2024-03-21 ENCOUNTER — Other Ambulatory Visit (HOSPITAL_COMMUNITY): Payer: Self-pay

## 2024-03-21 NOTE — Telephone Encounter (Signed)
 Prior shara was denied because clinical information was not provided by who started original prior auth, but rep let me start a new request. I faxed clinical info to [phone]  Reference number: [id number]

## 2024-03-21 NOTE — Telephone Encounter (Signed)
 Spoke with the patient in regards to her medication.

## 2024-03-22 ENCOUNTER — Other Ambulatory Visit (HOSPITAL_COMMUNITY): Payer: Self-pay

## 2024-03-22 NOTE — Telephone Encounter (Signed)
 Patient received medication. Approved, but waiting to close out until I receive approval letter.

## 2024-03-28 NOTE — Telephone Encounter (Signed)
 Addressed in new encounter.

## 2024-03-29 ENCOUNTER — Other Ambulatory Visit: Payer: Self-pay | Admitting: Internal Medicine

## 2024-03-29 DIAGNOSIS — I10 Essential (primary) hypertension: Secondary | ICD-10-CM

## 2024-03-29 DIAGNOSIS — E876 Hypokalemia: Secondary | ICD-10-CM

## 2024-03-30 ENCOUNTER — Other Ambulatory Visit: Payer: Self-pay | Admitting: Internal Medicine

## 2024-03-30 DIAGNOSIS — R9431 Abnormal electrocardiogram [ECG] [EKG]: Secondary | ICD-10-CM

## 2024-03-30 DIAGNOSIS — I10 Essential (primary) hypertension: Secondary | ICD-10-CM

## 2024-06-06 ENCOUNTER — Ambulatory Visit: Admitting: Physician Assistant
